# Patient Record
Sex: Male | Born: 1970 | Hispanic: Yes | Marital: Married | Smoking: Never smoker
Health system: Southern US, Community
[De-identification: ages and names within clinical notes are randomized; demographics above are authoritative.]

## PROBLEM LIST (undated history)

## (undated) DIAGNOSIS — K766 Portal hypertension: Secondary | ICD-10-CM

## (undated) DIAGNOSIS — I8510 Secondary esophageal varices without bleeding: Secondary | ICD-10-CM

## (undated) DIAGNOSIS — K7460 Unspecified cirrhosis of liver: Secondary | ICD-10-CM

## (undated) DIAGNOSIS — E119 Type 2 diabetes mellitus without complications: Secondary | ICD-10-CM

## (undated) DIAGNOSIS — I10 Essential (primary) hypertension: Secondary | ICD-10-CM

## (undated) DIAGNOSIS — F1010 Alcohol abuse, uncomplicated: Secondary | ICD-10-CM

## (undated) HISTORY — DX: Type 2 diabetes mellitus without complications: E11.9

---

## 2008-04-13 ENCOUNTER — Ambulatory Visit (HOSPITAL_COMMUNITY): Admission: RE | Admit: 2008-04-13 | Discharge: 2008-04-13 | Payer: Self-pay | Admitting: Emergency Medicine

## 2008-04-13 ENCOUNTER — Emergency Department (HOSPITAL_COMMUNITY): Admission: EM | Admit: 2008-04-13 | Discharge: 2008-04-13 | Payer: Self-pay | Admitting: Emergency Medicine

## 2016-04-22 ENCOUNTER — Inpatient Hospital Stay (HOSPITAL_COMMUNITY)
Admission: EM | Admit: 2016-04-22 | Discharge: 2016-04-26 | DRG: 683 | Disposition: A | Discharge status: Home / Self Care | Payer: Self-pay | Attending: Internal Medicine | Admitting: Internal Medicine

## 2016-04-22 ENCOUNTER — Encounter (HOSPITAL_COMMUNITY): Payer: Self-pay

## 2016-04-22 DIAGNOSIS — F10939 Alcohol use, unspecified with withdrawal, unspecified: Secondary | ICD-10-CM

## 2016-04-22 DIAGNOSIS — F1010 Alcohol abuse, uncomplicated: Secondary | ICD-10-CM | POA: Diagnosis present

## 2016-04-22 DIAGNOSIS — E878 Other disorders of electrolyte and fluid balance, not elsewhere classified: Secondary | ICD-10-CM

## 2016-04-22 DIAGNOSIS — E860 Dehydration: Secondary | ICD-10-CM | POA: Diagnosis present

## 2016-04-22 DIAGNOSIS — F10239 Alcohol dependence with withdrawal, unspecified: Secondary | ICD-10-CM | POA: Diagnosis present

## 2016-04-22 DIAGNOSIS — F10920 Alcohol use, unspecified with intoxication, uncomplicated: Secondary | ICD-10-CM

## 2016-04-22 DIAGNOSIS — I10 Essential (primary) hypertension: Secondary | ICD-10-CM | POA: Diagnosis present

## 2016-04-22 DIAGNOSIS — E871 Hypo-osmolality and hyponatremia: Secondary | ICD-10-CM | POA: Diagnosis present

## 2016-04-22 DIAGNOSIS — I951 Orthostatic hypotension: Secondary | ICD-10-CM

## 2016-04-22 DIAGNOSIS — E872 Acidosis, unspecified: Secondary | ICD-10-CM

## 2016-04-22 DIAGNOSIS — R55 Syncope and collapse: Secondary | ICD-10-CM | POA: Diagnosis present

## 2016-04-22 DIAGNOSIS — N179 Acute kidney failure, unspecified: Principal | ICD-10-CM | POA: Diagnosis present

## 2016-04-22 DIAGNOSIS — M6282 Rhabdomyolysis: Secondary | ICD-10-CM | POA: Diagnosis present

## 2016-04-22 HISTORY — DX: Essential (primary) hypertension: I10

## 2016-04-22 HISTORY — DX: Alcohol abuse, uncomplicated: F10.10

## 2016-04-22 LAB — CBC WITH DIFFERENTIAL/PLATELET
Basophils Absolute: 0.1 10*3/uL (ref 0.0–0.1)
Basophils Relative: 1 %
EOS PCT: 0 %
Eosinophils Absolute: 0 10*3/uL (ref 0.0–0.7)
HCT: 36 % — ABNORMAL LOW (ref 39.0–52.0)
HEMOGLOBIN: 12.3 g/dL — AB (ref 13.0–17.0)
LYMPHS ABS: 0.6 10*3/uL — AB (ref 0.7–4.0)
LYMPHS PCT: 11 %
MCH: 29.9 pg (ref 26.0–34.0)
MCHC: 34.2 g/dL (ref 30.0–36.0)
MCV: 87.4 fL (ref 78.0–100.0)
MONOS PCT: 10 %
Monocytes Absolute: 0.6 10*3/uL (ref 0.1–1.0)
NEUTROS PCT: 78 %
Neutro Abs: 4.3 10*3/uL (ref 1.7–7.7)
Platelets: 96 10*3/uL — ABNORMAL LOW (ref 150–400)
RBC: 4.12 MIL/uL — AB (ref 4.22–5.81)
RDW: 18.2 % — ABNORMAL HIGH (ref 11.5–15.5)
WBC: 5.6 10*3/uL (ref 4.0–10.5)

## 2016-04-22 MED ORDER — SODIUM CHLORIDE 0.9 % IV BOLUS (SEPSIS)
1000.0000 mL | Freq: Once | INTRAVENOUS | Status: AC
Start: 2016-04-22 — End: 2016-04-23
  Administered 2016-04-22: 1000 mL via INTRAVENOUS

## 2016-04-22 NOTE — ED Provider Notes (Signed)
[facility]-EMERGENCY DEPT Provider Note   CSN: [id number] Arrival date & time: 04/22/16  2254  By signing my name below, I, Phillis HaggisGabriella Gaje, attest that this documentation has been prepared under the direction and in the presence of Devoria AlbeIva Mansur Patti, MD. Electronically Signed: Phillis HaggisGabriella Gaje, ED Scribe. 04/22/16. 11:43 PM.  Time Seen 23:33 PM   History   Chief Complaint Chief Complaint  Patient presents with  . Weakness   The history is provided by the patient. No language interpreter was used.   HPI Comments: Wesley Williams is a 45 y.o. male with a hx of alcohol abuse brought in by EMS who presents to the Emergency Department complaining of generalized weakness onset 5 hours ago. 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 He states that it was very hot outside today and he was sweating a lot today, he experienced syncope and fell. He states that he did not drink a lot of water today because he was not thirsty. He was evaluated by EMS, told them he felt better, and stayed at work. He states that he went home and had cramps in his legs with diaphoresis and vomiting. The cramps have since resolved. Step-daughter called EMS at this time. He states that he had a normal BP while laying down in the ambulance, but experience orthostatic hypotension when he stood up, stating his BP dropped to 70. Pt has not been drinking alcohol tonight, but wife states that pt drinks a lot ~12 beers every day. He disputes that but admits to buying a 12 pack every day. Pt denies smoking or use of daily medications. He denies chest pain, abdominal pain, or diarrhea. He states he normally drinks water or Pepsi while working  PCP none  Past Medical History:  Diagnosis Date  . Alcohol abuse     Patient Active Problem List   Diagnosis Date Noted  . AKI (acute kidney injury) (HCC) 04/23/2016  . Dehydration 04/23/2016  . Rhabdomyolysis 04/23/2016  . Syncope and collapse 04/23/2016  . Alcohol abuse 04/23/2016    History  reviewed. No pertinent surgical history.    Home Medications    Prior to Admission medications   Not on File    Family History History reviewed. No pertinent family history.  Social History Social History  Substance Use Topics  . Smoking status: Never Smoker  . Smokeless tobacco: Never Used  . Alcohol use Yes  employed Lives with spouse   Allergies   Review of patient's allergies indicates no known allergies.   Review of Systems Review of Systems  Cardiovascular: Negative for chest pain.  Gastrointestinal: Positive for nausea and vomiting. Negative for abdominal pain and diarrhea.  Musculoskeletal: Positive for myalgias.  Neurological: Positive for syncope and weakness.  All other systems reviewed and are negative.  Physical Exam Updated Vital Signs BP 151/96 (BP Location: Left Arm)   Pulse 100   Temp 98 F (36.7 C) (Oral)   Resp 18   Ht 5\' 6"  (1.676 m)   Wt 176 lb (79.8 kg)   SpO2 100%   BMI 28.41 kg/m   Vital signs normal except borderline pulse   Physical Exam  Constitutional: He is oriented to person, place, and time. He appears well-developed and well-nourished.  Non-toxic appearance. He does not appear ill. No distress.  Appears intoxicated  HENT:  Head: Normocephalic and atraumatic.  Right Ear: External ear normal.  Left Ear: External ear normal.  Nose: Nose normal. No mucosal edema or rhinorrhea.  Mouth/Throat: Oropharynx  is clear and moist. Mucous membranes are dry. No dental abscesses or uvula swelling.  Tongue dry  Eyes: Conjunctivae and EOM are normal. Pupils are equal, round, and reactive to light.  Neck: Normal range of motion and full passive range of motion without pain. Neck supple.  Cardiovascular: Normal rate, regular rhythm and normal heart sounds.  Exam reveals no gallop and no friction rub.   No murmur heard. Pulmonary/Chest: Effort normal and breath sounds normal. No respiratory distress. He has no wheezes. He has no rhonchi. He  has no rales. He exhibits no tenderness and no crepitus.  Abdominal: Soft. Normal appearance and bowel sounds are normal. He exhibits no distension. There is no tenderness. There is no rebound and no guarding.  Musculoskeletal: Normal range of motion. He exhibits no edema or tenderness.  Moves all extremities well.   Neurological: He is alert and oriented to person, place, and time. He has normal strength. No cranial nerve deficit.  Skin: Skin is warm, dry and intact. No rash noted. No erythema. No pallor.  Psychiatric: His speech is slurred. He is slowed.  Nursing note and vitals reviewed.    ED Treatments / Results  DIAGNOSTIC STUDIES: Oxygen Saturation is  100% on RA, normal by my interpretation.    Labs (all labs ordered are listed, but only abnormal results are displayed) Results for orders placed or performed during the hospital encounter of 04/22/16  Comprehensive metabolic panel  Result Value Ref Range   Sodium 130 (L) 135 - 145 mmol/L   Potassium 3.5 3.5 - 5.1 mmol/L   Chloride 96 (L) 101 - 111 mmol/L   CO2 17 (L) 22 - 32 mmol/L   Glucose, Bld 146 (H) 65 - 99 mg/dL   BUN 28 (H) 6 - 20 mg/dL   Creatinine, Ser 9.812.49 (H) 0.61 - 1.24 mg/dL   Calcium 9.2 8.9 - 19.110.3 mg/dL   Total Protein 47.810.8 (H) 6.5 - 8.1 g/dL   Albumin 4.3 3.5 - 5.0 g/dL   AST 295137 (H) 15 - 41 U/L   ALT 39 17 - 63 U/L   Alkaline Phosphatase 112 38 - 126 U/L   Total Bilirubin 2.1 (H) 0.3 - 1.2 mg/dL   GFR calc non Af Amer 29 (L) >60 mL/min   GFR calc Af Amer 34 (L) >60 mL/min   Anion gap 17 (H) 5 - 15  Ethanol  Result Value Ref Range   Alcohol, Ethyl (B) 233 (H) <5 mg/dL  CBC with Differential  Result Value Ref Range   WBC 5.6 4.0 - 10.5 K/uL   RBC 4.12 (L) 4.22 - 5.81 MIL/uL   Hemoglobin 12.3 (L) 13.0 - 17.0 g/dL   HCT 62.136.0 (L) 30.839.0 - 65.752.0 %   MCV 87.4 78.0 - 100.0 fL   MCH 29.9 26.0 - 34.0 pg   MCHC 34.2 30.0 - 36.0 g/dL   RDW 84.618.2 (H) 96.211.5 - 95.215.5 %   Platelets 96 (L) 150 - 400 K/uL    Neutrophils Relative % 78 %   Neutro Abs 4.3 1.7 - 7.7 K/uL   Lymphocytes Relative 11 %   Lymphs Abs 0.6 (L) 0.7 - 4.0 K/uL   Monocytes Relative 10 %   Monocytes Absolute 0.6 0.1 - 1.0 K/uL   Eosinophils Relative 0 %   Eosinophils Absolute 0.0 0.0 - 0.7 K/uL   Basophils Relative 1 %   Basophils Absolute 0.1 0.0 - 0.1 K/uL  Magnesium  Result Value Ref Range   Magnesium 2.1 1.7 -  2.4 mg/dL  CK  Result Value Ref Range   Total CK 1,232 (H) 49 - 397 U/L   Laboratory interpretation all normal except Renal failure without old labs to compare to, metabolic acidosis, alcohol intoxication, mild anemia, hyponatremia, low chloride, CONSISTENT with dehydration    EKG  EKG Interpretation  Date/Time:  Thursday April 22 2016 23:27:21 EDT Ventricular Rate:  86 PR Interval:    QRS Duration: 99 QT Interval:  394 QTC Calculation: 472 R Axis:   58 Text Interpretation:  Sinus rhythm Non-specific ST-t changes Nonspecific ST abnormality No old tracing to compare Confirmed by Warda Mcqueary  MD-I, Aydon Swamy ([id number]) on 04/23/2016 2:37:13 AM      Radiology No results found.  Procedures Procedures (including critical care time)  Medications Ordered in ED Medications  LORazepam (ATIVAN) tablet 1 mg (not administered)    Or  LORazepam (ATIVAN) injection 1 mg (not administered)  thiamine (VITAMIN B-1) tablet 100 mg (not administered)    Or  thiamine (B-1) injection 100 mg (not administered)  folic acid (FOLVITE) tablet 1 mg (not administered)  multivitamin with minerals tablet 1 tablet (not administered)  metoprolol tartrate (LOPRESSOR) tablet 25 mg (25 mg Oral Given 04/23/16 0216)  sodium chloride 0.9 % bolus 1,000 mL (0 mLs Intravenous Stopped 04/23/16 0108)  sodium chloride 0.9 % bolus 1,000 mL (0 mLs Intravenous Stopped 04/23/16 0108)  sodium chloride 0.9 % bolus 1,000 mL (0 mLs Intravenous Stopped 04/23/16 0217)     Initial Impression / Assessment and Plan / ED Course     I have reviewed the triage  vital signs and the nursing notes.  Pertinent labs & imaging results that were available during my care of the patient were reviewed by me and considered in my medical decision making (see chart for details).  Clinical Course  11:42 PM-Discussed treatment plan which includes IV fluids with pt at bedside and pt agreed to plan.   PT was given IV fluids.   After reviewing his initial labs, CK was added to labs. Pt was given more IV fluids.  12:54 AM-Pt reports relief with IV fluids. Pt is updated on his blood work and what the values mean. He now admits that he was drinking alcohol, but stopped at noon. Discussed the need to stay in the hospital to improve kidney function and to be monitored overnight. He is agreeable  01:13 AM Dr Conley Rolls, hospitalist, will see patient for admission  Final Clinical Impressions(s) / ED Diagnoses   Final diagnoses:  Alcohol abuse  Alcohol intoxication, uncomplicated (HCC)  Acute renal injury (HCC)  Metabolic acidosis  Hyponatremia  Chloride, decreased level  Non-traumatic rhabdomyolysis  Syncope, unspecified syncope type  Orthostatic hypotension   Plan admission  Devoria Albe, MD, FACEP   I personally performed the services described in this documentation, which was scribed in my presence. The recorded information has been reviewed and considered.  Devoria Albe, MD, Concha Pyo, MD 04/23/16 [phone]

## 2016-04-22 NOTE — ED Triage Notes (Signed)
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 Patient states EMS came to house today because patient fell. Spouse states patient has been drinking a lot of beer, but has not drank beer today. Patient states EMS suggested he come here to get fluids. Patient denies pain.

## 2016-04-23 ENCOUNTER — Encounter (HOSPITAL_COMMUNITY): Payer: Self-pay | Admitting: Internal Medicine

## 2016-04-23 ENCOUNTER — Observation Stay (HOSPITAL_BASED_OUTPATIENT_CLINIC_OR_DEPARTMENT_OTHER): Payer: Self-pay

## 2016-04-23 DIAGNOSIS — M6282 Rhabdomyolysis: Secondary | ICD-10-CM

## 2016-04-23 DIAGNOSIS — N179 Acute kidney failure, unspecified: Secondary | ICD-10-CM | POA: Diagnosis present

## 2016-04-23 DIAGNOSIS — R55 Syncope and collapse: Secondary | ICD-10-CM

## 2016-04-23 DIAGNOSIS — E860 Dehydration: Secondary | ICD-10-CM | POA: Diagnosis present

## 2016-04-23 DIAGNOSIS — F1010 Alcohol abuse, uncomplicated: Secondary | ICD-10-CM | POA: Diagnosis present

## 2016-04-23 HISTORY — DX: Rhabdomyolysis: M62.82

## 2016-04-23 LAB — ECHOCARDIOGRAM COMPLETE
Height: 65 in
Weight: 2892.44 [oz_av]

## 2016-04-23 LAB — BASIC METABOLIC PANEL
ANION GAP: 11 (ref 5–15)
BUN: 23 mg/dL — ABNORMAL HIGH (ref 6–20)
CALCIUM: 8.1 mg/dL — AB (ref 8.9–10.3)
CHLORIDE: 106 mmol/L (ref 101–111)
CO2: 17 mmol/L — ABNORMAL LOW (ref 22–32)
CREATININE: 1.16 mg/dL (ref 0.61–1.24)
GFR calc non Af Amer: >60 mL/min (ref >60)
Glucose, Bld: 100 mg/dL — ABNORMAL HIGH (ref 65–99)
Potassium: 3.6 mmol/L (ref 3.5–5.1)
SODIUM: 134 mmol/L — AB (ref 135–145)

## 2016-04-23 LAB — COMPREHENSIVE METABOLIC PANEL
ALBUMIN: 4.3 g/dL (ref 3.5–5.0)
ALT: 39 U/L (ref 17–63)
AST: 137 U/L — AB (ref 15–41)
Alkaline Phosphatase: 112 U/L (ref 38–126)
Anion gap: 17 — ABNORMAL HIGH (ref 5–15)
BUN: 28 mg/dL — AB (ref 6–20)
CHLORIDE: 96 mmol/L — AB (ref 101–111)
CO2: 17 mmol/L — ABNORMAL LOW (ref 22–32)
CREATININE: 2.49 mg/dL — AB (ref 0.61–1.24)
Calcium: 9.2 mg/dL (ref 8.9–10.3)
GFR calc Af Amer: 34 mL/min — ABNORMAL LOW (ref >60)
GFR, EST NON AFRICAN AMERICAN: 29 mL/min — AB (ref >60)
GLUCOSE: 146 mg/dL — AB (ref 65–99)
Potassium: 3.5 mmol/L (ref 3.5–5.1)
Sodium: 130 mmol/L — ABNORMAL LOW (ref 135–145)
Total Bilirubin: 2.1 mg/dL — ABNORMAL HIGH (ref 0.3–1.2)
Total Protein: 10.8 g/dL — ABNORMAL HIGH (ref 6.5–8.1)

## 2016-04-23 LAB — CBC
HEMATOCRIT: 30.5 % — AB (ref 39.0–52.0)
HEMOGLOBIN: 10.4 g/dL — AB (ref 13.0–17.0)
MCH: 30 pg (ref 26.0–34.0)
MCHC: 34.1 g/dL (ref 30.0–36.0)
MCV: 87.9 fL (ref 78.0–100.0)
Platelets: 70 10*3/uL — ABNORMAL LOW (ref 150–400)
RBC: 3.47 MIL/uL — AB (ref 4.22–5.81)
RDW: 17.8 % — ABNORMAL HIGH (ref 11.5–15.5)
WBC: 4.4 10*3/uL (ref 4.0–10.5)

## 2016-04-23 LAB — TSH: TSH: 1.67 u[IU]/mL (ref 0.350–4.500)

## 2016-04-23 LAB — ETHANOL: ALCOHOL ETHYL (B): 233 mg/dL — AB (ref <5)

## 2016-04-23 LAB — CREATININE, SERUM
Creatinine, Ser: 1.23 mg/dL (ref 0.61–1.24)
GFR calc Af Amer: >60 mL/min
GFR calc non Af Amer: >60 mL/min

## 2016-04-23 LAB — CK
Total CK: 1119 U/L — ABNORMAL HIGH (ref 49–397)
Total CK: 1232 U/L — ABNORMAL HIGH (ref 49–397)

## 2016-04-23 LAB — MAGNESIUM: Magnesium: 2.1 mg/dL (ref 1.7–2.4)

## 2016-04-23 MED ORDER — ACETAMINOPHEN 325 MG PO TABS
650.0000 mg | ORAL_TABLET | Freq: Four times a day (QID) | ORAL | Status: DC | PRN
  Administered 2016-04-23: 650 mg via ORAL
  Filled 2016-04-23: qty 2

## 2016-04-23 MED ORDER — M.V.I. ADULT IV INJ
INJECTION | Freq: Once | INTRAVENOUS | Status: AC
  Administered 2016-04-23: 05:00:00 via INTRAVENOUS
  Filled 2016-04-23: qty 1000

## 2016-04-23 MED ORDER — SODIUM CHLORIDE 0.9 % IV SOLN
INTRAVENOUS | Status: DC
  Administered 2016-04-23 (×2): via INTRAVENOUS

## 2016-04-23 MED ORDER — THIAMINE HCL 100 MG/ML IJ SOLN
100.0000 mg | Freq: Every day | INTRAMUSCULAR | Status: DC
  Administered 2016-04-24: 100 mg via INTRAVENOUS
  Filled 2016-04-23 (×2): qty 2

## 2016-04-23 MED ORDER — HEPARIN SODIUM (PORCINE) 5000 UNIT/ML IJ SOLN
5000.0000 [IU] | Freq: Three times a day (TID) | INTRAMUSCULAR | Status: DC
  Administered 2016-04-23 – 2016-04-26 (×10): 5000 [IU] via SUBCUTANEOUS
  Filled 2016-04-23 (×10): qty 1

## 2016-04-23 MED ORDER — VITAMIN B-1 100 MG PO TABS
100.0000 mg | ORAL_TABLET | Freq: Every day | ORAL | Status: DC
Start: 2016-04-23 — End: 2016-04-26
  Administered 2016-04-23 – 2016-04-26 (×3): 100 mg via ORAL
  Filled 2016-04-23 (×3): qty 1

## 2016-04-23 MED ORDER — SODIUM CHLORIDE 0.9% FLUSH
3.0000 mL | Freq: Two times a day (BID) | INTRAVENOUS | Status: DC
  Administered 2016-04-24 – 2016-04-25 (×2): 3 mL via INTRAVENOUS

## 2016-04-23 MED ORDER — LORAZEPAM 1 MG PO TABS
1.0000 mg | ORAL_TABLET | Freq: Four times a day (QID) | ORAL | Status: DC | PRN
  Administered 2016-04-24: 1 mg via ORAL

## 2016-04-23 MED ORDER — M.V.I. ADULT IV INJ
INJECTION | INTRAVENOUS | Status: AC
  Filled 2016-04-23: qty 10

## 2016-04-23 MED ORDER — ADULT MULTIVITAMIN W/MINERALS CH
1.0000 | ORAL_TABLET | Freq: Every day | ORAL | Status: DC
  Administered 2016-04-23 – 2016-04-26 (×4): 1 via ORAL
  Filled 2016-04-23 (×4): qty 1

## 2016-04-23 MED ORDER — TAMSULOSIN HCL 0.4 MG PO CAPS
0.4000 mg | ORAL_CAPSULE | Freq: Every day | ORAL | Status: DC
  Administered 2016-04-23 – 2016-04-24 (×2): 0.4 mg via ORAL
  Filled 2016-04-23 (×2): qty 1

## 2016-04-23 MED ORDER — LORAZEPAM 2 MG/ML IJ SOLN
1.0000 mg | Freq: Four times a day (QID) | INTRAMUSCULAR | Status: DC | PRN
  Administered 2016-04-23 – 2016-04-24 (×3): 1 mg via INTRAVENOUS
  Filled 2016-04-23 (×4): qty 1

## 2016-04-23 MED ORDER — FOLIC ACID 5 MG/ML IJ SOLN
INTRAMUSCULAR | Status: AC
  Filled 2016-04-23: qty 0.2

## 2016-04-23 MED ORDER — THIAMINE HCL 100 MG/ML IJ SOLN
INTRAMUSCULAR | Status: AC
  Filled 2016-04-23: qty 2

## 2016-04-23 MED ORDER — SODIUM CHLORIDE 0.9 % IV BOLUS (SEPSIS)
1000.0000 mL | Freq: Once | INTRAVENOUS | Status: AC
  Administered 2016-04-23: 1000 mL via INTRAVENOUS

## 2016-04-23 MED ORDER — FOLIC ACID 1 MG PO TABS
1.0000 mg | ORAL_TABLET | Freq: Every day | ORAL | Status: DC
  Administered 2016-04-23 – 2016-04-26 (×4): 1 mg via ORAL
  Filled 2016-04-23 (×4): qty 1

## 2016-04-23 MED ORDER — METOPROLOL TARTRATE 25 MG PO TABS
25.0000 mg | ORAL_TABLET | Freq: Two times a day (BID) | ORAL | Status: DC
  Administered 2016-04-23 – 2016-04-24 (×4): 25 mg via ORAL
  Filled 2016-04-23 (×4): qty 1

## 2016-04-23 NOTE — Progress Notes (Signed)
*  PRELIMINARY RESULTS* Echocardiogram 2D Echocardiogram has been performed.  Stacey DrainWhite, Arine Foley J 04/23/2016, 11:18 AM

## 2016-04-23 NOTE — H&P (Signed)
History and Physical    Wesley ShyJose A Agosto ZOX:[id number] DOB: 01/23/1971 DOA: 04/22/2016  PCP: No PCP Per Patient   Patient coming from: Home   Chief Complaint: Leg cramps   HPI: Wesley Williams is a 45 y.o. male with benign past medical history, but perhaps by virtue of hardly ever saw any physician, hx of alcohol abuse presented with complaints of leg cramps. PT has alcohol in his blood and had a syncope episode earlier today. EMS came but he refused to come to the hospital at that time.  Pt went home and began to have cramps in his legs with associated diaphoresis and vomiting. Family brought him to the hospital. Pt states he drinks 5-6 beers a night. Pt denies chest pain, abdominal pain, or diarrhea. While in the ED he had an elevated creatinine of 2.49 with unclear baseline renal Fx,  and a CBK of 1200. He was placed on IVFs and referred for further evaluation of AKI.   ED Course: BUN 28. Cr 2.49. AST 137. Platelets 96. EKG showed NSR with no acute ST T changes.   Review of Systems: As per HPI otherwise 10 point review of systems negative.    Past Medical History:  Diagnosis Date  . Alcohol abuse     History reviewed. No pertinent surgical history.   reports that he has never smoked. He has never used smokeless tobacco. He reports that he drinks alcohol. He reports that he does not use drugs.  No Known Allergies  History reviewed. No pertinent family history.  Prior to Admission medications   Not on File    Physical Exam: Vitals:   04/22/16 2330 04/23/16 0000 04/23/16 0030 04/23/16 0100  BP: (!) 150/107 (!) 167/104 (!) 153/101 161/96  Pulse: 82 87 88 97  Resp: 20 22 22 20   Temp:      TempSrc:      SpO2: 98% 100% 95% 99%  Weight:      Height:          Constitutional: NAD, calm, comfortable Vitals:   04/22/16 2330 04/23/16 0000 04/23/16 0030 04/23/16 0100  BP: (!) 150/107 (!) 167/104 (!) 153/101 161/96  Pulse: 82 87 88 97  Resp: 20 22 22 20   Temp:      TempSrc:        SpO2: 98% 100% 95% 99%  Weight:      Height:       Eyes: PERRL, lids and conjunctivae normal ENMT: Mucous membranes are moist. Posterior pharynx clear of any exudate or lesions.Normal dentition.  Neck: normal, supple, no masses, no thyromegaly Respiratory: clear to auscultation bilaterally, no wheezing, no crackles. Normal respiratory effort. No accessory muscle use.  Cardiovascular: Regular rate and rhythm, no murmurs / rubs / gallops. No extremity edema. 2+ pedal pulses. No carotid bruits.  Abdomen: no tenderness, no masses palpated. No hepatosplenomegaly. Bowel sounds positive.  Musculoskeletal: no clubbing / cyanosis. No joint deformity upper and lower extremities. Good ROM, no contractures. Normal muscle tone.  Skin: no rashes, lesions, ulcers. No induration Neurologic: CN 2-12 grossly intact. Sensation intact, DTR normal. Strength 5/5 in all 4.  Psychiatric: Normal judgment and insight. Alert and oriented x 3. Normal mood.    Labs on Admission: I have personally reviewed following labs and imaging studies  CBC:  Recent Labs Lab 04/22/16 2330  WBC 5.6  NEUTROABS 4.3  HGB 12.3*  HCT 36.0*  MCV 87.4  PLT 96*   Basic Metabolic Panel:  Recent Labs Lab 04/22/16  2330  NA 130*  K 3.5  CL 96*  CO2 17*  GLUCOSE 146*  BUN 28*  CREATININE 2.49*  CALCIUM 9.2  MG 2.1   Liver Function Tests:  Recent Labs Lab 04/22/16 2330  AST 137*  ALT 39  ALKPHOS 112  BILITOT 2.1*  PROT 10.8*  ALBUMIN 4.3   Cardiac Enzymes:  Recent Labs Lab 04/23/16 0031  CKTOTAL 1,232*   EKG: Independently reviewed.   Assessment/Plan Active Problems:   * No active hospital problems. *   1. AKI. Cr 2.49 . Unsure of baseline creatine. Will give IV fluids.   I suspect he has baseline CKD, likely from chronic untreated HTN.  Will follow Cr.   2. Rhabdomyolysis. Will give IVFs. Follow CPK.  3. Alcohol abuse. Will counsel on the importance of cessation.  Will place on WICA with PRN  ativan and banana bag. 4. HTN:  Will start him on Lopressor BID.    DVT prophylaxis: SCDs  Code Status: Full Family Communication: Son and niece bedside.  Disposition Plan: Discharge home once improved  Consults called: None  Admission status: Observation    Houston SirenPeter Jasper Hanf, MD FACP [facility] If 7PM-7AM, please contact night-coverage [email or web address] Password [email or web address]  04/23/2016, 1:12 AM   By signing my name below, I, Cynda AcresHailei Fulton, attest that this documentation has been prepared under the direction and in the presence of Houston SirenPeter Shirly Bartosiewicz, MD. Electronically signed: Cynda AcresHailei Fulton, Scribe. 04/23/16 1:15AM

## 2016-04-23 NOTE — Progress Notes (Signed)
Patient with orders to be transferred to Unit 300. Report called to Rolm BookbinderLisa B, RN. Patient transferred to 311. Patient stable at time of transfer.

## 2016-04-23 NOTE — Progress Notes (Addendum)
PROGRESS NOTE    Wesley Williams  ZOX:[id number] DOB: 03-10-1971 DOA: 04/22/2016 PCP: No PCP Per Patient    Brief Narrative:  45 y.o. male with benign past medical history, but perhaps by virtue of hardly ever saw any physician, hx of alcohol abuse presented with complaints of leg cramps. PT has alcohol in his blood and had a syncope episode earlier today. EMS came but he refused to come to the hospital at that time.  Pt went home and began to have cramps in his legs with associated diaphoresis and vomiting. Family brought him to the hospital. Pt states he drinks 5-6 beers a night. Pt denies chest pain, abdominal pain, or diarrhea. While in the ED he had an elevated creatinine of 2.49 with unclear baseline renal Fx,  and a CBK of 1200. He was placed on IVFs and referred for further evaluation of AKI  Assessment & Plan:   Principal Problem:   AKI (acute kidney injury) (HCC) Active Problems:   Dehydration   Rhabdomyolysis   Syncope and collapse   Alcohol abuse  1. Acute renal failure 1. Suspect secondary to markedly dehydration, prerenal renal failure 2. Continue IV fluids as tolerated 3. Continue monitor renal function. 4. Improving creatinine 5. Little urine output noted today. Bladder scan obtained with around 400 mL of urine post residual. We'll do in and out cath. 6. Will start Flomax empirically 2. Dehydration 1. Patient continued on IV fluids 2. Clinically seems improved 3. Rhabdomyolysis 1. Peak CK of over 1200 2. Repeat CK 12 hours later down to around 1100 4. Syncope and collapse 1. Suspect related to ETOH intoxication given elevated ETOH level on admit 5. Alcohol abuse with ETOH withdrawals 1. Presenting ETOH level of 233 2. Pt reports last ETOH intake was day prior to admission 3. Patient is continued on CIWA 4. Some tremors noted on exam this AM . By afternoon, tremors have worsened to where pt has difficulty operating cell phone. Also appears somewhat  diaphoretic. 5. Called to update patient's wife. Although pt is eager to go home, both pt and his wife agree to stay to tx ETOH withdrawal  DVT prophylaxis: heparin subq Code Status: Full Family Communication: Pt in room Disposition Plan: Uncertain at this time  Consultants:    Procedures:   2d echo  Antimicrobials: Anti-infectives    None       Subjective: Claims to be feeling better. Questioning about going home  Objective: Vitals:   04/23/16 0600 04/23/16 0745 04/23/16 0800 04/23/16 1133  BP: (!) 141/86  (!) 164/106   Pulse:      Resp: 20  (!) 26   Temp:  97.9 F (36.6 C)  98.3 F (36.8 C)  TempSrc:  Oral  Oral  SpO2: 97%     Weight:      Height:        Intake/Output Summary (Last 24 hours) at 04/23/16 1529 Last data filed at 04/23/16 1400  Gross per 24 hour  Intake              240 ml  Output              600 ml  Net             -360 ml   Filed Weights   04/22/16 2304 04/23/16 0411  Weight: 79.8 kg (176 lb) 82 kg (180 lb 12.4 oz)    Examination: General exam: Appears calm and comfortable, laying in bed Respiratory system: Clear to  auscultation. Respiratory effort normal. Cardiovascular system: S1 & S2 heard, RRR Gastrointestinal system: Abdomen is nondistended, soft and nontender. No organomegaly or masses felt. Normal bowel sounds heard. Central nervous system: Alert and oriented. Generalized shaking/tremors. Extremities: Symmetric 5 x 5 power. Skin: No rashes, lesions Psychiatry: Judgement and insight appear normal. Mood & affect appropriate.   Data Reviewed: I have personally reviewed following labs and imaging studies  CBC:  Recent Labs Lab 04/22/16 2330 04/23/16 0454  WBC 5.6 4.4  NEUTROABS 4.3  --   HGB 12.3* 10.4*  HCT 36.0* 30.5*  MCV 87.4 87.9  PLT 96* 70*   Basic Metabolic Panel:  Recent Labs Lab 04/22/16 2330 04/23/16 0454  NA 130* 134*  K 3.5 3.6  CL 96* 106  CO2 17* 17*  GLUCOSE 146* 100*  BUN 28* 23*   CREATININE 2.49* 1.16  1.23  CALCIUM 9.2 8.1*  MG 2.1  --    GFR: Estimated Creatinine Clearance: 74 mL/min (by C-G formula based on SCr of 1.23 mg/dL). Liver Function Tests:  Recent Labs Lab 04/22/16 2330  AST 137*  ALT 39  ALKPHOS 112  BILITOT 2.1*  PROT 10.8*  ALBUMIN 4.3   No results for input(s): LIPASE, AMYLASE in the last 168 hours. No results for input(s): AMMONIA in the last 168 hours. Coagulation Profile: No results for input(s): INR, PROTIME in the last 168 hours. Cardiac Enzymes:  Recent Labs Lab 04/23/16 0031 04/23/16 1427  CKTOTAL 1,232* 1,119*   BNP (last 3 results) No results for input(s): PROBNP in the last 8760 hours. HbA1C: No results for input(s): HGBA1C in the last 72 hours. CBG: No results for input(s): GLUCAP in the last 168 hours. Lipid Profile: No results for input(s): CHOL, HDL, LDLCALC, TRIG, CHOLHDL, LDLDIRECT in the last 72 hours. Thyroid Function Tests:  Recent Labs  04/23/16 0454  TSH 1.670   Anemia Panel: No results for input(s): VITAMINB12, FOLATE, FERRITIN, TIBC, IRON, RETICCTPCT in the last 72 hours. Sepsis Labs: No results for input(s): PROCALCITON, LATICACIDVEN in the last 168 hours.  No results found for this or any previous visit (from the past 240 hour(s)).   Radiology Studies: No results found.  Scheduled Meds: . folic acid  1 mg Oral Daily  . heparin  5,000 Units Subcutaneous Q8H  . metoprolol tartrate  25 mg Oral BID  . multivitamin with minerals  1 tablet Oral Daily  . sodium chloride flush  3 mL Intravenous Q12H  . thiamine  100 mg Oral Daily   Or  . thiamine  100 mg Intravenous Daily   Continuous Infusions: . sodium chloride 150 mL/hr at 04/23/16 1231     LOS: 0 days   Jackilyn Umphlett, Scheryl MartenSTEPHEN K, MD [facility] Pager [phone]  If 7PM-7AM, please contact night-coverage [email or web address] Password [email or web address] 04/23/2016, 3:29 PM

## 2016-04-24 DIAGNOSIS — R55 Syncope and collapse: Secondary | ICD-10-CM

## 2016-04-24 DIAGNOSIS — F1010 Alcohol abuse, uncomplicated: Secondary | ICD-10-CM

## 2016-04-24 DIAGNOSIS — N179 Acute kidney failure, unspecified: Principal | ICD-10-CM

## 2016-04-24 DIAGNOSIS — M6282 Rhabdomyolysis: Secondary | ICD-10-CM

## 2016-04-24 DIAGNOSIS — E860 Dehydration: Secondary | ICD-10-CM

## 2016-04-24 LAB — CBC
HCT: 30.6 % — ABNORMAL LOW (ref 39.0–52.0)
Hemoglobin: 10.2 g/dL — ABNORMAL LOW (ref 13.0–17.0)
MCH: 29.7 pg (ref 26.0–34.0)
MCHC: 33.3 g/dL (ref 30.0–36.0)
MCV: 89.2 fL (ref 78.0–100.0)
PLATELETS: 67 10*3/uL — AB (ref 150–400)
RBC: 3.43 MIL/uL — ABNORMAL LOW (ref 4.22–5.81)
RDW: 17.8 % — AB (ref 11.5–15.5)
WBC: 3.4 10*3/uL — AB (ref 4.0–10.5)

## 2016-04-24 LAB — BASIC METABOLIC PANEL
Anion gap: 8 (ref 5–15)
BUN: 10 mg/dL (ref 6–20)
CALCIUM: 7.8 mg/dL — AB (ref 8.9–10.3)
CO2: 23 mmol/L (ref 22–32)
CREATININE: 0.58 mg/dL — AB (ref 0.61–1.24)
Chloride: 104 mmol/L (ref 101–111)
GFR calc non Af Amer: >60 mL/min (ref >60)
Glucose, Bld: 117 mg/dL — ABNORMAL HIGH (ref 65–99)
Potassium: 3.4 mmol/L — ABNORMAL LOW (ref 3.5–5.1)
Sodium: 135 mmol/L (ref 135–145)

## 2016-04-24 LAB — MRSA PCR SCREENING: MRSA BY PCR: NEGATIVE

## 2016-04-24 LAB — CK: CK TOTAL: 800 U/L — AB (ref 49–397)

## 2016-04-24 MED ORDER — SODIUM CHLORIDE 0.9 % IV SOLN
INTRAVENOUS | Status: DC
  Administered 2016-04-24 – 2016-04-26 (×3): via INTRAVENOUS

## 2016-04-24 MED ORDER — ZIPRASIDONE MESYLATE 20 MG IM SOLR: 20.0000 mg | Freq: Once | INTRAMUSCULAR | Status: DC

## 2016-04-24 MED ORDER — POTASSIUM CHLORIDE 10 MEQ/100ML IV SOLN
10.0000 meq | INTRAVENOUS | Status: AC
  Administered 2016-04-24 (×4): 10 meq via INTRAVENOUS
  Filled 2016-04-24: qty 100

## 2016-04-24 MED ORDER — METOPROLOL TARTRATE 5 MG/5ML IV SOLN
5.0000 mg | Freq: Four times a day (QID) | INTRAVENOUS | Status: DC
  Administered 2016-04-24 – 2016-04-26 (×9): 5 mg via INTRAVENOUS
  Filled 2016-04-24 (×9): qty 5

## 2016-04-24 MED ORDER — HALOPERIDOL LACTATE 5 MG/ML IJ SOLN
5.0000 mg | Freq: Once | INTRAMUSCULAR | Status: AC
  Administered 2016-04-24: 5 mg via INTRAMUSCULAR
  Filled 2016-04-24: qty 1

## 2016-04-24 MED ORDER — DEXMEDETOMIDINE HCL IN NACL 200 MCG/50ML IV SOLN
INTRAVENOUS | Status: AC
  Administered 2016-04-24: 0.2 ug/kg/h
  Filled 2016-04-24: qty 50

## 2016-04-24 MED ORDER — ZIPRASIDONE MESYLATE 20 MG IM SOLR
20.0000 mg | Freq: Once | INTRAMUSCULAR | Status: AC
  Administered 2016-04-24: 20 mg via INTRAMUSCULAR
  Filled 2016-04-24: qty 20

## 2016-04-24 MED ORDER — DEXMEDETOMIDINE HCL IN NACL 200 MCG/50ML IV SOLN
0.4000 ug/kg/h | INTRAVENOUS | Status: DC
Start: 2016-04-24 — End: 2016-04-25
  Administered 2016-04-24: 0.7 ug/kg/h via INTRAVENOUS
  Administered 2016-04-24: 1.2 ug/kg/h via INTRAVENOUS
  Administered 2016-04-24: 0.7 ug/kg/h via INTRAVENOUS
  Administered 2016-04-25 (×7): 1.2 ug/kg/h via INTRAVENOUS
  Filled 2016-04-24 (×4): qty 50
  Filled 2016-04-24: qty 100
  Filled 2016-04-24 (×5): qty 50

## 2016-04-24 MED ORDER — HYDRALAZINE HCL 20 MG/ML IJ SOLN
10.0000 mg | INTRAMUSCULAR | Status: DC | PRN
  Administered 2016-04-24 – 2016-04-26 (×2): 10 mg via INTRAVENOUS
  Filled 2016-04-24 (×2): qty 1

## 2016-04-24 NOTE — Progress Notes (Signed)
Haloperidol 5 mg IM ordered and given to patient

## 2016-04-24 NOTE — Progress Notes (Signed)
Pt very agitated. MD notified and Ativan 1 mg administered to patient.

## 2016-04-24 NOTE — Treatment Plan (Signed)
Patient seen. Full note to follow. Patient increasingly agitated, shaking, wanting to walk out of hospital. Given total of 3mg  of ativan this AM per CIWA and 5mg  haldol without effect. Patient still agitated and aggressive towards staff. Becoming difficult to re-direct. Now having visual hallucinations (sees "friend" in next patient room). Discussed with Dr. Elsie SaasJonnalagadda who recommends 20mg  Geodon IM now and when more stable, initiate precedex. Geodon ordered.

## 2016-04-24 NOTE — Progress Notes (Signed)
PROGRESS NOTE    Wesley Williams  ZOX:[id number] DOB: 01-22-1971 DOA: 04/22/2016 PCP: No PCP Per Patient    Brief Narrative:  45 y.o. male with benign past medical history, but perhaps by virtue of hardly ever saw any physician, hx of alcohol abuse presented with complaints of leg cramps. PT has alcohol in his blood and had a syncope episode earlier today. EMS came but he refused to come to the hospital at that time.  Pt went home and began to have cramps in his legs with associated diaphoresis and vomiting. Family brought him to the hospital. Pt states he drinks 5-6 beers a night. Pt denies chest pain, abdominal pain, or diarrhea. While in the ED he had an elevated creatinine of 2.49 with unclear baseline renal Fx,  and a CBK of 1200. He was placed on IVFs and referred for further evaluation of AKI  Assessment & Plan:   Principal Problem:   AKI (acute kidney injury) (HCC) Active Problems:   Dehydration   Rhabdomyolysis   Syncope and collapse   Alcohol abuse  1. Acute renal failure 1. Suspect secondary to markedly dehydration, prerenal renal failure 2. Renal function has normalized 3. Reduce IVF to 75cc/hr 4. Continue monitor renal function. 5. On 8/18, over 400cc on bladder scan. Started Flomax. Cont foley cath for now 2. Dehydration 1. Improved 3. Rhabdomyolysis 1. Peak CK of over 1200 2. Resolved 4. Syncope and collapse 1. Suspect related to ETOH intoxication given elevated ETOH level on admit 5. Alcohol abuse with ETOH withdrawals 1. Presenting ETOH level of 233 2. Pt reports last ETOH intake was day prior to admission 3. Per family, patient has MARKED daily daily ETOH abuse. Family is aware of patient's ETOH abuse and has tried to convince him to cut back in the past. 4. See treatment plan note. Patient has failed CIWA protocol while on floor 5. Pt now on Precedex. Pt is currently arousable and maintaining airway. Following closely. Low threshold for intubation. 6. Patient  transferred to ICU for closer monitoring  DVT prophylaxis: heparin subq Code Status: Full Family Communication: Pt in room, multiple family at bedside Disposition Plan: Uncertain at this time  Consultants:  Discussed case with Dr. Elsie Williams  Procedures:   2d echo  Antimicrobials: Anti-infectives    None      Subjective: More confused, agitated, hallucinating. Wants to go home  Objective: Vitals:   04/23/16 1500 04/23/16 2052 04/24/16 0628 04/24/16 0950  BP: (!) 148/85 (!) 158/87 (!) 153/81 (!) 147/98  Pulse:  82 76 73  Resp: (!) 21 20 20    Temp:  98.5 F (36.9 C) 98.2 F (36.8 C)   TempSrc:  Oral Oral   SpO2:  100% 99%   Weight:      Height:        Intake/Output Summary (Last 24 hours) at 04/24/16 1500 Last data filed at 04/24/16 0300  Gross per 24 hour  Intake           3132.5 ml  Output             1200 ml  Net           1932.5 ml   Filed Weights   04/22/16 2304 04/23/16 0411  Weight: 79.8 kg (176 lb) 82 kg (180 lb 12.4 oz)    Examination: General exam: Confused, agitated, ambulating hallway this AM, appears diaphoretic Respiratory system: Clear to auscultation. Respiratory effort normal. Cardiovascular system: S1 & S2 heard, RRR Gastrointestinal system: Abdomen  is nondistended, soft and nontender. No organomegaly or masses felt. Normal bowel sounds heard. Central nervous system: Alert and oriented. Continued shaking/tremors. Extremities: Symmetric 5 x 5 power. Skin: No rashes, lesions Psychiatry: Poor judgement, poor insight, confused, visual hallucinations, anxious, agitated  Data Reviewed: I have personally reviewed following labs and imaging studies  CBC:  Recent Labs Lab 04/22/16 2330 04/23/16 0454 04/24/16 0553  WBC 5.6 4.4 3.4*  NEUTROABS 4.3  --   --   HGB 12.3* 10.4* 10.2*  HCT 36.0* 30.5* 30.6*  MCV 87.4 87.9 89.2  PLT 96* 70* 67*   Basic Metabolic Panel:  Recent Labs Lab 04/22/16 2330 04/23/16 0454 04/24/16 0553  NA  130* 134* 135  K 3.5 3.6 3.4*  CL 96* 106 104  CO2 17* 17* 23  GLUCOSE 146* 100* 117*  BUN 28* 23* 10  CREATININE 2.49* 1.16  1.23 0.58*  CALCIUM 9.2 8.1* 7.8*  MG 2.1  --   --    GFR: Estimated Creatinine Clearance: 113.7 mL/min (by C-G formula based on SCr of 0.8 mg/dL). Liver Function Tests:  Recent Labs Lab 04/22/16 2330  AST 137*  ALT 39  ALKPHOS 112  BILITOT 2.1*  PROT 10.8*  ALBUMIN 4.3   No results for input(s): LIPASE, AMYLASE in the last 168 hours. No results for input(s): AMMONIA in the last 168 hours. Coagulation Profile: No results for input(s): INR, PROTIME in the last 168 hours. Cardiac Enzymes:  Recent Labs Lab 04/23/16 0031 04/23/16 1427 04/24/16 0553  CKTOTAL 1,232* 1,119* 800*   BNP (last 3 results) No results for input(s): PROBNP in the last 8760 hours. HbA1C: No results for input(s): HGBA1C in the last 72 hours. CBG: No results for input(s): GLUCAP in the last 168 hours. Lipid Profile: No results for input(s): CHOL, HDL, LDLCALC, TRIG, CHOLHDL, LDLDIRECT in the last 72 hours. Thyroid Function Tests:  Recent Labs  04/23/16 0454  TSH 1.670   Anemia Panel: No results for input(s): VITAMINB12, FOLATE, FERRITIN, TIBC, IRON, RETICCTPCT in the last 72 hours. Sepsis Labs: No results for input(s): PROCALCITON, LATICACIDVEN in the last 168 hours.  Recent Results (from the past 240 hour(s))  MRSA PCR Screening     Status: None   Collection Time: 04/24/16 12:30 AM  Result Value Ref Range Status   MRSA by PCR NEGATIVE NEGATIVE Final    Comment:        The GeneXpert MRSA Assay (FDA approved for NASAL specimens only), is one component of a comprehensive MRSA colonization surveillance program. It is not intended to diagnose MRSA infection nor to guide or monitor treatment for MRSA infections.      Radiology Studies: No results found.  Scheduled Meds: . folic acid  1 mg Oral Daily  . heparin  5,000 Units Subcutaneous Q8H  .  metoprolol  5 mg Intravenous Q6H  . multivitamin with minerals  1 tablet Oral Daily  . sodium chloride flush  3 mL Intravenous Q12H  . thiamine  100 mg Oral Daily   Or  . thiamine  100 mg Intravenous Daily   Continuous Infusions: . sodium chloride 75 mL/hr at 04/24/16 1354  . dexmedetomidine 0.7 mcg/kg/hr (04/24/16 1355)     LOS: 0 days   Moni Rothrock, Scheryl MartenSTEPHEN K, MD [facility] Pager [phone]  If 7PM-7AM, please contact night-coverage [email or web address] Password [email or web address] 04/24/2016, 3:00 PM

## 2016-04-24 NOTE — Progress Notes (Signed)
Pt transferred to ICU room 3. Report given to AutolivBob RN. All questions were answered and no further questions at this time.  Pt in no acute distress and resting in bed at this time.

## 2016-04-24 NOTE — Progress Notes (Signed)
Pt becoming extremely agitated and aggressive. MD notified and made aware. Received order to transfer patient to ICU room 3.

## 2016-04-24 NOTE — Progress Notes (Signed)
Geodon 20 mg IM ordered and administered.

## 2016-04-25 DIAGNOSIS — F10239 Alcohol dependence with withdrawal, unspecified: Secondary | ICD-10-CM

## 2016-04-25 DIAGNOSIS — F10939 Alcohol use, unspecified with withdrawal, unspecified: Secondary | ICD-10-CM

## 2016-04-25 LAB — COMPREHENSIVE METABOLIC PANEL
ALT: 41 U/L (ref 17–63)
AST: 110 U/L — AB (ref 15–41)
Albumin: 2.9 g/dL — ABNORMAL LOW (ref 3.5–5.0)
Alkaline Phosphatase: 97 U/L (ref 38–126)
Anion gap: 6 (ref 5–15)
BILIRUBIN TOTAL: 1.5 mg/dL — AB (ref 0.3–1.2)
BUN: 8 mg/dL (ref 6–20)
CHLORIDE: 105 mmol/L (ref 101–111)
CO2: 23 mmol/L (ref 22–32)
CREATININE: 0.48 mg/dL — AB (ref 0.61–1.24)
Calcium: 8.3 mg/dL — ABNORMAL LOW (ref 8.9–10.3)
Glucose, Bld: 161 mg/dL — ABNORMAL HIGH (ref 65–99)
Potassium: 3 mmol/L — ABNORMAL LOW (ref 3.5–5.1)
Sodium: 134 mmol/L — ABNORMAL LOW (ref 135–145)
TOTAL PROTEIN: 7.7 g/dL (ref 6.5–8.1)

## 2016-04-25 LAB — CK: CK TOTAL: 429 U/L — AB (ref 49–397)

## 2016-04-25 LAB — CBC
HCT: 32.5 % — ABNORMAL LOW (ref 39.0–52.0)
Hemoglobin: 10.9 g/dL — ABNORMAL LOW (ref 13.0–17.0)
MCH: 30 pg (ref 26.0–34.0)
MCHC: 33.5 g/dL (ref 30.0–36.0)
MCV: 89.5 fL (ref 78.0–100.0)
PLATELETS: 73 10*3/uL — AB (ref 150–400)
RBC: 3.63 MIL/uL — AB (ref 4.22–5.81)
RDW: 17.6 % — AB (ref 11.5–15.5)
WBC: 2.9 10*3/uL — AB (ref 4.0–10.5)

## 2016-04-25 LAB — MAGNESIUM: MAGNESIUM: 1.4 mg/dL — AB (ref 1.7–2.4)

## 2016-04-25 MED ORDER — POTASSIUM CHLORIDE 10 MEQ/100ML IV SOLN
10.0000 meq | INTRAVENOUS | Status: AC
  Administered 2016-04-25 (×6): 10 meq via INTRAVENOUS

## 2016-04-25 NOTE — Progress Notes (Signed)
PROGRESS NOTE    Wesley Williams  ZOX:[id number] DOB: 12-21-1970 DOA: 04/22/2016 PCP: No PCP Per Patient    Brief Narrative:  45 y.o. male with benign past medical history, but perhaps by virtue of hardly ever saw any physician, hx of alcohol abuse presented with complaints of leg cramps. PT has alcohol in his blood and had a syncope episode earlier today. EMS came but he refused to come to the hospital at that time.  Pt went home and began to have cramps in his legs with associated diaphoresis and vomiting. Family brought him to the hospital. Pt states he drinks 5-6 beers a night. Pt denies chest pain, abdominal pain, or diarrhea. While in the ED he had an elevated creatinine of 2.49 with unclear baseline renal Fx,  and a CBK of 1200. He was placed on IVFs and referred for further evaluation of AKI  Assessment & Plan:   Principal Problem:   AKI (acute kidney injury) (HCC) Active Problems:   Dehydration   Rhabdomyolysis   Syncope and collapse   Alcohol abuse  1. Acute renal failure 1. Suspect secondary to markedly dehydration, prerenal renal failure 2. Renal function has normalized 3. Continue monitor renal function. 4. On 8/18, over 400cc on bladder scan. Started Flomax 5. Consider voiding trial soon 2. Dehydration 1. Improved 3. Rhabdomyolysis 1. Peak CK of over 1200 2. Much improved 4. Syncope and collapse 1. Suspect related to ETOH intoxication given elevated ETOH level on admit. See below 5. Alcohol abuse with ETOH withdrawals 1. Presenting ETOH level of 233 2. Pt reports last ETOH intake was day prior to admission 3. Per family, patient has MARKED daily daily ETOH abuse. Family is aware of patient's ETOH abuse and has tried to convince him to cut back in the past. 4. Patient recently failed CIWA protocol while on floor and required transfer to ICU with Precedex. 5. Patient has maintained airway while on Precedex, now weaned to off 6. Stable at present  DVT prophylaxis:  heparin subq Code Status: Full Family Communication: Pt in room Disposition Plan: Possible d/c home in 24-48hrs  Consultants:  Discussed case with Dr. Elsie SaasJonnalagadda  Procedures:   2d echo  Antimicrobials: Anti-infectives    None      Subjective: On sedation this AM, difficult to assess  Objective: Vitals:   04/25/16 1200 04/25/16 1300 04/25/16 1400 04/25/16 1500  BP: (!) 143/93 (!) 139/112 (!) 147/95 109/68  Pulse: 60 (!) 53  (!) 58  Resp: 16  16 16   Temp:      TempSrc:      SpO2: 97% 96%  99%  Weight:      Height:        Intake/Output Summary (Last 24 hours) at 04/25/16 1609 Last data filed at 04/25/16 1454  Gross per 24 hour  Intake          3302.52 ml  Output             4850 ml  Net         -1547.48 ml   Filed Weights   04/22/16 2304 04/23/16 0411  Weight: 79.8 kg (176 lb) 82 kg (180 lb 12.4 oz)    Examination: General exam: Laying in bed, sedated, in nad Respiratory system: Clear to auscultation. Respiratory effort normal. Cardiovascular system: S1 & S2 heard, RRR Gastrointestinal system: Abdomen is nondistended, soft and nontender. No organomegaly or masses felt. Normal bowel sounds heard. Central nervous system: Sedated, difficult to assess Extremities: Symmetric 5  x 5 power. Skin: No rashes, lesions Psychiatry: currently sedated, difficult to determine  Data Reviewed: I have personally reviewed following labs and imaging studies  CBC:  Recent Labs Lab 04/22/16 2330 04/23/16 0454 04/24/16 0553 04/25/16 0441  WBC 5.6 4.4 3.4* 2.9*  NEUTROABS 4.3  --   --   --   HGB 12.3* 10.4* 10.2* 10.9*  HCT 36.0* 30.5* 30.6* 32.5*  MCV 87.4 87.9 89.2 89.5  PLT 96* 70* 67* 73*   Basic Metabolic Panel:  Recent Labs Lab 04/22/16 2330 04/23/16 0454 04/24/16 0553 04/25/16 0441  NA 130* 134* 135 134*  K 3.5 3.6 3.4* 3.0*  CL 96* 106 104 105  CO2 17* 17* 23 23  GLUCOSE 146* 100* 117* 161*  BUN 28* 23* 10 8  CREATININE 2.49* 1.16  1.23 0.58*  0.48*  CALCIUM 9.2 8.1* 7.8* 8.3*  MG 2.1  --   --  1.4*   GFR: Estimated Creatinine Clearance: 113.7 mL/min (by C-G formula based on SCr of 0.8 mg/dL). Liver Function Tests:  Recent Labs Lab 04/22/16 2330 04/25/16 0441  AST 137* 110*  ALT 39 41  ALKPHOS 112 97  BILITOT 2.1* 1.5*  PROT 10.8* 7.7  ALBUMIN 4.3 2.9*   No results for input(s): LIPASE, AMYLASE in the last 168 hours. No results for input(s): AMMONIA in the last 168 hours. Coagulation Profile: No results for input(s): INR, PROTIME in the last 168 hours. Cardiac Enzymes:  Recent Labs Lab 04/23/16 0031 04/23/16 1427 04/24/16 0553 04/25/16 0441  CKTOTAL 1,232* 1,119* 800* 429*   BNP (last 3 results) No results for input(s): PROBNP in the last 8760 hours. HbA1C: No results for input(s): HGBA1C in the last 72 hours. CBG: No results for input(s): GLUCAP in the last 168 hours. Lipid Profile: No results for input(s): CHOL, HDL, LDLCALC, TRIG, CHOLHDL, LDLDIRECT in the last 72 hours. Thyroid Function Tests:  Recent Labs  04/23/16 0454  TSH 1.670   Anemia Panel: No results for input(s): VITAMINB12, FOLATE, FERRITIN, TIBC, IRON, RETICCTPCT in the last 72 hours. Sepsis Labs: No results for input(s): PROCALCITON, LATICACIDVEN in the last 168 hours.  Recent Results (from the past 240 hour(s))  MRSA PCR Screening     Status: None   Collection Time: 04/24/16 12:30 AM  Result Value Ref Range Status   MRSA by PCR NEGATIVE NEGATIVE Final    Comment:        The GeneXpert MRSA Assay (FDA approved for NASAL specimens only), is one component of a comprehensive MRSA colonization surveillance program. It is not intended to diagnose MRSA infection nor to guide or monitor treatment for MRSA infections.      Radiology Studies: No results found.  Scheduled Meds: . folic acid  1 mg Oral Daily  . heparin  5,000 Units Subcutaneous Q8H  . metoprolol  5 mg Intravenous Q6H  . multivitamin with minerals  1 tablet  Oral Daily  . sodium chloride flush  3 mL Intravenous Q12H  . thiamine  100 mg Oral Daily   Or  . thiamine  100 mg Intravenous Daily   Continuous Infusions: . sodium chloride 75 mL/hr at 04/25/16 0829     LOS: 1 day   Tamel Abel, Scheryl MartenSTEPHEN K, MD [facility] Pager [phone]  If 7PM-7AM, please contact night-coverage [email or web address] Password [email or web address] 04/25/2016, 4:09 PM

## 2016-04-26 DIAGNOSIS — F10230 Alcohol dependence with withdrawal, uncomplicated: Secondary | ICD-10-CM

## 2016-04-26 LAB — BASIC METABOLIC PANEL
Anion gap: 5 (ref 5–15)
BUN: 10 mg/dL (ref 6–20)
CHLORIDE: 104 mmol/L (ref 101–111)
CO2: 24 mmol/L (ref 22–32)
Calcium: 8.1 mg/dL — ABNORMAL LOW (ref 8.9–10.3)
Creatinine, Ser: 0.55 mg/dL — ABNORMAL LOW (ref 0.61–1.24)
GFR calc Af Amer: >60 mL/min (ref >60)
GFR calc non Af Amer: >60 mL/min (ref >60)
GLUCOSE: 132 mg/dL — AB (ref 65–99)
POTASSIUM: 3.2 mmol/L — AB (ref 3.5–5.1)
Sodium: 133 mmol/L — ABNORMAL LOW (ref 135–145)

## 2016-04-26 LAB — CBC
HEMATOCRIT: 32.6 % — AB (ref 39.0–52.0)
HEMOGLOBIN: 11 g/dL — AB (ref 13.0–17.0)
MCH: 30.1 pg (ref 26.0–34.0)
MCHC: 33.7 g/dL (ref 30.0–36.0)
MCV: 89.3 fL (ref 78.0–100.0)
Platelets: 80 10*3/uL — ABNORMAL LOW (ref 150–400)
RBC: 3.65 MIL/uL — ABNORMAL LOW (ref 4.22–5.81)
RDW: 17.8 % — ABNORMAL HIGH (ref 11.5–15.5)
WBC: 5 10*3/uL (ref 4.0–10.5)

## 2016-04-26 LAB — MAGNESIUM: MAGNESIUM: 1.2 mg/dL — AB (ref 1.7–2.4)

## 2016-04-26 MED ORDER — MAGNESIUM SULFATE 4 GM/100ML IV SOLN
4.0000 g | Freq: Once | INTRAVENOUS | Status: AC
  Administered 2016-04-26: 4 g via INTRAVENOUS
  Filled 2016-04-26: qty 100

## 2016-04-26 MED ORDER — METOPROLOL SUCCINATE ER 25 MG PO TB24: 25.0000 mg | ORAL_TABLET | Freq: Every day | ORAL | 0 refills | Status: DC

## 2016-04-26 MED ORDER — POTASSIUM CHLORIDE CRYS ER 20 MEQ PO TBCR
40.0000 meq | EXTENDED_RELEASE_TABLET | Freq: Two times a day (BID) | ORAL | Status: DC
  Administered 2016-04-26: 40 meq via ORAL
  Filled 2016-04-26: qty 2

## 2016-04-26 MED ORDER — LORAZEPAM 2 MG/ML IJ SOLN
INTRAMUSCULAR | Status: AC
  Filled 2016-04-26: qty 1

## 2016-04-26 MED ORDER — LORAZEPAM 2 MG/ML IJ SOLN
2.0000 mg | INTRAMUSCULAR | Status: DC | PRN
Start: 2016-04-26 — End: 2016-04-26
  Administered 2016-04-26: 2 mg via INTRAVENOUS

## 2016-04-26 MED ORDER — AMLODIPINE BESYLATE 2.5 MG PO TABS: 2.5000 mg | ORAL_TABLET | Freq: Every day | ORAL | 0 refills | Status: DC

## 2016-04-26 MED ORDER — AMLODIPINE BESYLATE 5 MG PO TABS
2.5000 mg | ORAL_TABLET | Freq: Every day | ORAL | Status: DC
  Administered 2016-04-26: 2.5 mg via ORAL
  Filled 2016-04-26: qty 1

## 2016-04-26 NOTE — Discharge Summary (Signed)
Physician Discharge Summary  Wesley Williams ZOX:[id number] DOB: 06-14-1971 DOA: 04/22/2016  PCP: No PCP Per Patient  Admit date: 04/22/2016 Discharge date: 04/26/2016  Admitted From: Home Disposition:  Home  Recommendations for Outpatient Follow-up:  1. Follow up with PCP in 1-2 weeks 2. Reminded to avoid further alcohol intake  Discharge Condition:Improved CODE STATUS:Full Diet recommendation: Regular   Brief/Interim Summary: 45 y.o.malewith benign past medical history, but perhaps by virtue of hardly ever saw any physician, hx of alcohol abuse presented with complaints of leg cramps. PT has alcohol in his blood and had a syncope episode earlier today. EMS came buthe refused to come to the hospital at that time. Pt went home and began to have cramps in his legs with associateddiaphoresis and vomiting. Family brought him to the hospital. Pt states he drinks 5-6 beers a night. Pt denies chest pain, abdominal pain, or diarrhea. While in the ED he had an elevated creatinine of 2.49 with unclear baseline renal Fx, and a CBK of 1200. He was placed on IVFs and referred for further evaluation of AKI   1. Acute renal failure 1. Suspect secondary to markedly dehydration, prerenal renal failure 2. Renal function has normalized 3. Continue monitor renal function. 4. On 8/18, over 400cc on bladder scan. Started Flomax 5. Patient briefly required foley cath, later passed voiding trial 6. Renal failure resolved 2. Dehydration 1. Improved 3. Rhabdomyolysis 1. Peak CK of over 1200 2. Much improved 4. Syncope and collapse 1. Suspect related to ETOH intoxication given elevated ETOH level on admit. See below 5. Alcohol abuse with ETOH withdrawals 1. Presenting ETOH level of 233 2. Pt reports last ETOH intake was day prior to admission 3. Per family, patient has MARKED daily daily ETOH abuse. Family is aware of patient's ETOH abuse and has tried to convince him to cut back in the  past. 4. Patient failed CIWA protocol while on floor and required transfer to ICU with Precedex. 5. Patient had maintained airway while on Precedex. Precedex was weaned to off 6. Patient has remained stable 6. HTN 1. BP noted to be markedly elevated 2. Patient started on 25mg  metoprolol and 2.5mg  amlodipine for stage 3 HTN 3. Patient instructed to follow up with PCP for further BP med adjustment  Discharge Diagnoses:  Principal Problem:   AKI (acute kidney injury) (HCC) Active Problems:   Dehydration   Rhabdomyolysis   Syncope and collapse   Alcohol abuse   Alcohol withdrawal (HCC)    Discharge Instructions     Medication List    TAKE these medications   acetaminophen 500 MG tablet Commonly known as:  TYLENOL Take 1,000 mg by mouth every 6 (six) hours as needed for headache.   amLODipine 2.5 MG tablet Commonly known as:  NORVASC Take 1 tablet (2.5 mg total) by mouth daily.   metoprolol succinate 25 MG 24 hr tablet Commonly known as:  TOPROL XL Take 1 tablet (25 mg total) by mouth daily.      Follow-up Information    Follow up with your PCP in 1-2 weeks .          No Known Allergies  Consultations:  Discussed case with Psychiatry  Procedures/Studies: No results found.  Subjective: Very eager to go home today  Discharge Exam: Vitals:   04/26/16 1554 04/26/16 1600  BP:  (!) 178/102  Pulse: 89   Resp:  (!) 25  Temp:     Vitals:   04/26/16 1500 04/26/16 1540 04/26/16 1554 04/26/16  1600  BP: (!) 153/102 (!) 181/112  (!) 178/102  Pulse:   89   Resp: 20   (!) 25  Temp:      TempSrc:      SpO2:      Weight:      Height:        General: Pt is alert, awake, not in acute distress Cardiovascular: RRR, S1/S2 +, no rubs, no gallops Respiratory: CTA bilaterally, no wheezing, no rhonchi Abdominal: Soft, NT, ND, bowel sounds + Extremities: no edema, no cyanosis   The results of significant diagnostics from this hospitalization (including imaging,  microbiology, ancillary and laboratory) are listed below for reference.     Microbiology: Recent Results (from the past 240 hour(s))  MRSA PCR Screening     Status: None   Collection Time: 04/24/16 12:30 AM  Result Value Ref Range Status   MRSA by PCR NEGATIVE NEGATIVE Final    Comment:        The GeneXpert MRSA Assay (FDA approved for NASAL specimens only), is one component of a comprehensive MRSA colonization surveillance program. It is not intended to diagnose MRSA infection nor to guide or monitor treatment for MRSA infections.      Labs: BNP (last 3 results) No results for input(s): BNP in the last 8760 hours. Basic Metabolic Panel:  Recent Labs Lab 04/22/16 2330 04/23/16 0454 04/24/16 0553 04/25/16 0441 04/26/16 0453  NA 130* 134* 135 134* 133*  K 3.5 3.6 3.4* 3.0* 3.2*  CL 96* 106 104 105 104  CO2 17* 17* 23 23 24   GLUCOSE 146* 100* 117* 161* 132*  BUN 28* 23* 10 8 10   CREATININE 2.49* 1.16  1.23 0.58* 0.48* 0.55*  CALCIUM 9.2 8.1* 7.8* 8.3* 8.1*  MG 2.1  --   --  1.4* 1.2*   Liver Function Tests:  Recent Labs Lab 04/22/16 2330 04/25/16 0441  AST 137* 110*  ALT 39 41  ALKPHOS 112 97  BILITOT 2.1* 1.5*  PROT 10.8* 7.7  ALBUMIN 4.3 2.9*   No results for input(s): LIPASE, AMYLASE in the last 168 hours. No results for input(s): AMMONIA in the last 168 hours. CBC:  Recent Labs Lab 04/22/16 2330 04/23/16 0454 04/24/16 0553 04/25/16 0441 04/26/16 0453  WBC 5.6 4.4 3.4* 2.9* 5.0  NEUTROABS 4.3  --   --   --   --   HGB 12.3* 10.4* 10.2* 10.9* 11.0*  HCT 36.0* 30.5* 30.6* 32.5* 32.6*  MCV 87.4 87.9 89.2 89.5 89.3  PLT 96* 70* 67* 73* 80*   Cardiac Enzymes:  Recent Labs Lab 04/23/16 0031 04/23/16 1427 04/24/16 0553 04/25/16 0441  CKTOTAL 1,232* 1,119* 800* 429*   BNP: Invalid input(s): POCBNP CBG: No results for input(s): GLUCAP in the last 168 hours. D-Dimer No results for input(s): DDIMER in the last 72 hours. Hgb A1c No  results for input(s): HGBA1C in the last 72 hours. Lipid Profile No results for input(s): CHOL, HDL, LDLCALC, TRIG, CHOLHDL, LDLDIRECT in the last 72 hours. Thyroid function studies No results for input(s): TSH, T4TOTAL, T3FREE, THYROIDAB in the last 72 hours.  Invalid input(s): FREET3 Anemia work up No results for input(s): VITAMINB12, FOLATE, FERRITIN, TIBC, IRON, RETICCTPCT in the last 72 hours. Urinalysis No results found for: COLORURINE, APPEARANCEUR, LABSPEC, PHURINE, GLUCOSEU, HGBUR, BILIRUBINUR, KETONESUR, PROTEINUR, UROBILINOGEN, NITRITE, LEUKOCYTESUR Sepsis Labs Invalid input(s): PROCALCITONIN,  WBC,  LACTICIDVEN Microbiology Recent Results (from the past 240 hour(s))  MRSA PCR Screening     Status: None  Collection Time: 04/24/16 12:30 AM  Result Value Ref Range Status   MRSA by PCR NEGATIVE NEGATIVE Final    Comment:        The GeneXpert MRSA Assay (FDA approved for NASAL specimens only), is one component of a comprehensive MRSA colonization surveillance program. It is not intended to diagnose MRSA infection nor to guide or monitor treatment for MRSA infections.      SIGNED:   Jerald KiefHIU, STEPHEN K, MD  [facility] 04/26/2016, 4:40 PM  If 7PM-7AM, please contact night-coverage [email or web address] Password [email or web address]

## 2016-04-26 NOTE — Care Management Note (Signed)
Case Management Note  Patient Details  Name: Effie ShyJose A Caloca MRN: [id number] Date of Birth: [birth year]  Subjective/Objective:                  Pt admitted with ETOH abuse, rhabdo and syncope. He is from home, ind with ADL's. He has family that lives with him, he works two jobs, is not insured and does not have PCP. He has been seen by CSW and given information on ETOH abuse f/u. He is not interested in PCP follow up being arranged. FC aware of uninsured status. Anticipate DC home in next 24 hrs.   Action/Plan: Pt not interested in CM assistance with DC planning.   Expected Discharge Date:    04/26/2016              Expected Discharge Plan:  Home/Self Care  In-House Referral:  Clinical Social Work  Discharge planning Services  CM Consult  Post Acute Care Choice:  NA Choice offered to:  NA  DME Arranged:    DME Agency:     HH Arranged:    HH Agency:     Status of Service:  Completed, signed off  If discussed at [employer] Meetings, dates discussed:    Additional Comments:  Malcolm MetroChildress, Shalisa Mcquade Demske, RN 04/26/2016, 3:17 PM

## 2016-04-26 NOTE — Progress Notes (Signed)
MD notified of CIWA of 12 and pt request for something for the "shakes". New orders received and noted.

## 2016-04-26 NOTE — Progress Notes (Signed)
Orders received for discharge; instructions including medications given to pt. Pt verbalized understanding. PIV removed with no complications. Pt understands that he is to call to make appointment with PCP for follow up care. Pt taken out with all belongings via WC by NT.

## 2016-04-26 NOTE — Clinical Social Work Note (Signed)
Clinical Social Work Assessment  Patient Details  Name: Wesley ShyJose A Diantonio MRN: [id number] Date of Birth: [birth year]  Date of referral:  04/26/16               Reason for consult:  Substance Use/ETOH Abuse                Permission sought to share information with:    Permission granted to share information::     Name::        Agency::     Relationship::     Contact Information:     Housing/Transportation Living arrangements for the past 2 months:  Single Family Home Source of Information:  Patient Patient Interpreter Needed:  None Criminal Activity/Legal Involvement Pertinent to Current Situation/Hospitalization:  No - Comment as needed Significant Relationships:  Dependent Children, Significant Other Lives with:  Significant Other Do you feel safe going back to the place where you live?  Yes Need for family participation in patient care:  Yes (Comment)  Care giving concerns:  None identified.    Social Worker assessment / plan:  CSW discussed reason for referral being concerns related to alcohol use. Patient admitted to drinking approximately 4 beers today. He denied having an alcohol abuse problem. Patient advised that he resides in the home with his girlfriend and his 46 year old son. CSW completed SBIRT with patient.  CSW discussed treatment options and provided out patient treatment listing.  CSW offered to assist with appointment scheduling. Patient declined assistance and advised that he would call a treatment facility when he got home. CSW left contact information in the event that patient decided that he wanted assistance in appointment scheduling.  CSW signing off.   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 He desires to schedule his own appointment.   Patient/Family's Understanding of and Emotional Response to Diagnosis, Current Treatment, and Prognosis:  Patient denied being concerned about his alcohol use despite this hospitalization being related to issues with alcohol.   Emotional Assessment Appearance:  Appears stated age Attitude/Demeanor/Rapport:   (Cooperative and pleasant) Affect (typically observed):  Accepting Orientation:  Oriented to Self, Oriented to Place, Oriented to  Time, Oriented to Situation Alcohol / Substance use:  Alcohol Use Psych involvement (Current and /or in the community):  No (Comment)  Discharge Needs  Concerns to be addressed:  Substance Abuse Concerns Readmission within the last 30 days:  No Current discharge risk:  Substance Abuse Barriers to Discharge:  No Barriers Identified   Annice NeedySettle, Adisynn Suleiman D, LCSW 04/26/2016, 10:10 AM

## 2016-04-26 NOTE — Progress Notes (Signed)
Pt continues to get up from bed and chair without calling for assistance despite the fact that he has been educated about the dangers of falling. RN and NT utilizing chair and bed alarms to attempt to keep pt safe. Pt refuses to allow staff to stay in the room while he is using the bathroom.

## 2016-04-29 ENCOUNTER — Emergency Department (HOSPITAL_COMMUNITY)
Admission: EM | Admit: 2016-04-29 | Discharge: 2016-04-29 | Disposition: A | Discharge status: Home / Self Care | Payer: Self-pay | Attending: Emergency Medicine | Admitting: Emergency Medicine

## 2016-04-29 ENCOUNTER — Encounter (HOSPITAL_COMMUNITY): Payer: Self-pay | Admitting: *Deleted

## 2016-04-29 ENCOUNTER — Emergency Department (HOSPITAL_COMMUNITY): Payer: Self-pay

## 2016-04-29 DIAGNOSIS — I10 Essential (primary) hypertension: Secondary | ICD-10-CM | POA: Insufficient documentation

## 2016-04-29 DIAGNOSIS — Z791 Long term (current) use of non-steroidal anti-inflammatories (NSAID): Secondary | ICD-10-CM | POA: Insufficient documentation

## 2016-04-29 DIAGNOSIS — M25562 Pain in left knee: Secondary | ICD-10-CM | POA: Insufficient documentation

## 2016-04-29 MED ORDER — IBUPROFEN 600 MG PO TABS: 600.0000 mg | ORAL_TABLET | Freq: Three times a day (TID) | ORAL | 0 refills | Status: DC

## 2016-04-29 MED ORDER — OXYCODONE-ACETAMINOPHEN 5-325 MG PO TABS
1.0000 | ORAL_TABLET | Freq: Once | ORAL | Status: AC
  Administered 2016-04-29: 1 via ORAL
  Filled 2016-04-29: qty 1

## 2016-04-29 MED ORDER — IBUPROFEN 800 MG PO TABS
800.0000 mg | ORAL_TABLET | Freq: Once | ORAL | Status: AC
  Administered 2016-04-29: 800 mg via ORAL
  Filled 2016-04-29: qty 1

## 2016-04-29 MED ORDER — HYDROCODONE-ACETAMINOPHEN 5-325 MG PO TABS: ORAL_TABLET | ORAL | 0 refills | Status: DC

## 2016-04-29 NOTE — ED Triage Notes (Signed)
Pt comes in for left knee swelling starting yesterday. Pt states he has fluid built up on his knee and that this has happened before. Denies any injury, denies calf pain.

## 2016-04-29 NOTE — ED Notes (Signed)
Pt made aware to return if symptoms worsen or if any life threatening symptoms occur.   

## 2016-04-29 NOTE — Discharge Instructions (Signed)
Wear the ace wrap as needed when weight bearing.  Call Dr. Mort SawyersHarrison's office to arrange a follow-up appt

## 2016-05-01 NOTE — ED Provider Notes (Signed)
[facility] Provider Note   CSN: [id number] Arrival date & time: 04/29/16  1314     History   Chief Complaint Chief Complaint  Patient presents with  . Knee Pain    HPI Wesley Williams is a 45 y.o. male.  HPI   Wesley Williams is a 45 y.o. male who presents to the Emergency Department complaining of left knee pain for one day.  He was admitted to this hospital on 04/22/16 for dehydration, acute renal failure and syncope.  He was later discharged on 04/26/16.  He reports knee pain that he describes as throbbing and he is having pain with attempted flexion.  He admits to previous swelling of the knee which required aspiration.  He denies injury, redness, fever, chills, numbness or weakness of the leg.  He has not taken any medication for his symptoms   Past Medical History:  Diagnosis Date  . Alcohol abuse   . Hypertension     Patient Active Problem List   Diagnosis Date Noted  . Alcohol withdrawal (HCC) 04/25/2016  . AKI (acute kidney injury) (HCC) 04/23/2016  . Dehydration 04/23/2016  . Rhabdomyolysis 04/23/2016  . Syncope and collapse 04/23/2016  . Alcohol abuse 04/23/2016    History reviewed. No pertinent surgical history.     Home Medications    Prior to Admission medications   Medication Sig Start Date End Date Taking? Authorizing Provider  acetaminophen (TYLENOL) 500 MG tablet Take 1,000 mg by mouth every 6 (six) hours as needed for headache.    Historical Provider, MD  amLODipine (NORVASC) 2.5 MG tablet Take 1 tablet (2.5 mg total) by mouth daily. 04/26/16   Jerald Kief, MD  HYDROcodone-acetaminophen (NORCO/VICODIN) 5-325 MG tablet Take one-two tabs po q 4-6 hrs prn pain 04/29/16   Jamin Panther, PA-C  ibuprofen (ADVIL,MOTRIN) 600 MG tablet Take 1 tablet (600 mg total) by mouth 3 (three) times daily. Take with food 04/29/16   Kanton Kamel, PA-C  metoprolol succinate (TOPROL XL) 25 MG 24 hr tablet Take 1 tablet (25 mg total) by mouth daily. 04/26/16    Jerald Kief, MD    Family History No family history on file.  Social History Social History  Substance Use Topics  . Smoking status: Never Smoker  . Smokeless tobacco: Never Used  . Alcohol use 7.2 oz/week    12 Cans of beer per week     Comment: daily 12 pk     Allergies   Review of patient's allergies indicates no known allergies.   Review of Systems Review of Systems  Constitutional: Negative for chills and fever.  Respiratory: Negative for shortness of breath.   Cardiovascular: Negative for chest pain.  Gastrointestinal: Negative for vomiting.  Genitourinary: Negative for difficulty urinating and dysuria.  Musculoskeletal: Positive for arthralgias (left knee pain) and joint swelling.  Skin: Negative for color change and wound.  Neurological: Negative for weakness and numbness.  All other systems reviewed and are negative.    Physical Exam Updated Vital Signs BP (!) 166/106   Pulse 75   Temp 98.9 F (37.2 C) (Oral)   Resp 16   SpO2 100%   Physical Exam  Constitutional: He is oriented to person, place, and time. He appears well-developed and well-nourished. No distress.  Cardiovascular: Normal rate, regular rhythm and intact distal pulses.   Pulmonary/Chest: Effort normal and breath sounds normal.  Musculoskeletal: He exhibits tenderness.  Diffuse ttp of the anterior left knee.  No erythema, excessive warmth, effusion,  or step-off deformity.  Mild crepitus on ROM.  DP pulse brisk, distal sensation intact. Calf is soft and NT.  Neurological: He is alert and oriented to person, place, and time. He exhibits normal muscle tone. Coordination normal.  Skin: Skin is warm and dry. No erythema.  Nursing note and vitals reviewed.    ED Treatments / Results  Labs (all labs ordered are listed, but only abnormal results are displayed) Labs Reviewed - No data to display  EKG  EKG Interpretation None       Radiology Dg Knee Complete 4 Views Left  Result  Date: 04/29/2016 CLINICAL DATA:  Pain without trauma. EXAM: LEFT KNEE - COMPLETE 4+ VIEW COMPARISON:  None. FINDINGS: No fracture or dislocation. Degenerative changes seen most prominent in the medial and patellofemoral compartments. No other acute abnormalities. IMPRESSION: Degenerative change.  No acute fracture. Electronically Signed   By: Gerome Samavid  Williams III M.D   On: 04/29/2016 14:53    Procedures Procedures (including critical care time)  Medications Ordered in ED Medications  oxyCODONE-acetaminophen (PERCOCET/ROXICET) 5-325 MG per tablet 1 tablet (1 tablet Oral Given 04/29/16 1504)  ibuprofen (ADVIL,MOTRIN) tablet 800 mg (800 mg Oral Given 04/29/16 1504)     Initial Impression / Assessment and Plan / ED Course  I have reviewed the triage vital signs and the nursing notes.  Pertinent labs & imaging results that were available during my care of the patient were reviewed by me and considered in my medical decision making (see chart for details).  Clinical Course   Pt with recurrent pain of the left knee.  No effusion clinically, no concerning sx's for septic joint.  Pain likely related to deg changes.  Pt agrees to symptomatic tx and orthopedic f/u.  Referral given  ACE wrap applied.  Remains NV intact  Final Clinical Impressions(s) / ED Diagnoses   Final diagnoses:  Knee pain, acute, left    New Prescriptions Discharge Medication List as of 04/29/2016  3:15 PM    START taking these medications   Details  HYDROcodone-acetaminophen (NORCO/VICODIN) 5-325 MG tablet Take one-two tabs po q 4-6 hrs prn pain, Print    ibuprofen (ADVIL,MOTRIN) 600 MG tablet Take 1 tablet (600 mg total) by mouth 3 (three) times daily. Take with food, Starting Thu 04/29/2016, Print         Yeraldine Forney Holcombriplett, [place] JerseyPA-C 05/01/16 [id number]    Rolland PorterMark James, MD 05/05/16 [phone]

## 2016-06-18 ENCOUNTER — Encounter (HOSPITAL_COMMUNITY): Payer: Self-pay | Admitting: Emergency Medicine

## 2016-06-18 ENCOUNTER — Emergency Department (HOSPITAL_COMMUNITY): Payer: Self-pay

## 2016-06-18 ENCOUNTER — Emergency Department (HOSPITAL_COMMUNITY)
Admission: EM | Admit: 2016-06-18 | Discharge: 2016-06-18 | Disposition: A | Discharge status: Home / Self Care | Payer: Self-pay | Attending: Emergency Medicine | Admitting: Emergency Medicine

## 2016-06-18 DIAGNOSIS — I10 Essential (primary) hypertension: Secondary | ICD-10-CM | POA: Insufficient documentation

## 2016-06-18 DIAGNOSIS — Z79899 Other long term (current) drug therapy: Secondary | ICD-10-CM | POA: Insufficient documentation

## 2016-06-18 DIAGNOSIS — M545 Low back pain: Secondary | ICD-10-CM

## 2016-06-18 DIAGNOSIS — Z791 Long term (current) use of non-steroidal anti-inflammatories (NSAID): Secondary | ICD-10-CM | POA: Insufficient documentation

## 2016-06-18 DIAGNOSIS — Z760 Encounter for issue of repeat prescription: Secondary | ICD-10-CM

## 2016-06-18 LAB — BASIC METABOLIC PANEL
Anion gap: 5 (ref 5–15)
BUN: 15 mg/dL (ref 6–20)
CHLORIDE: 109 mmol/L (ref 101–111)
CO2: 23 mmol/L (ref 22–32)
CREATININE: 0.58 mg/dL — AB (ref 0.61–1.24)
Calcium: 8.8 mg/dL — ABNORMAL LOW (ref 8.9–10.3)
GFR calc Af Amer: >60 mL/min (ref >60)
GFR calc non Af Amer: >60 mL/min (ref >60)
GLUCOSE: 95 mg/dL (ref 65–99)
Potassium: 4 mmol/L (ref 3.5–5.1)
Sodium: 137 mmol/L (ref 135–145)

## 2016-06-18 LAB — URINALYSIS, ROUTINE W REFLEX MICROSCOPIC
BILIRUBIN URINE: NEGATIVE
GLUCOSE, UA: NEGATIVE mg/dL
HGB URINE DIPSTICK: NEGATIVE
Ketones, ur: NEGATIVE mg/dL
Leukocytes, UA: NEGATIVE
Nitrite: NEGATIVE
PH: 5.5 (ref 5.0–8.0)
Protein, ur: NEGATIVE mg/dL
SPECIFIC GRAVITY, URINE: 1.025 (ref 1.005–1.030)

## 2016-06-18 MED ORDER — METHOCARBAMOL 500 MG PO TABS: 500.0000 mg | ORAL_TABLET | Freq: Four times a day (QID) | ORAL | 0 refills | Status: DC | PRN

## 2016-06-18 MED ORDER — TAMSULOSIN HCL 0.4 MG PO CAPS: 0.4000 mg | ORAL_CAPSULE | Freq: Every day | ORAL | 0 refills | Status: DC

## 2016-06-18 MED ORDER — AMLODIPINE BESYLATE 2.5 MG PO TABS: 2.5000 mg | ORAL_TABLET | Freq: Every day | ORAL | 0 refills | Status: DC

## 2016-06-18 MED ORDER — METOPROLOL SUCCINATE ER 25 MG PO TB24: 25.0000 mg | ORAL_TABLET | Freq: Every day | ORAL | 0 refills | Status: DC

## 2016-06-18 MED ORDER — LIDOCAINE 5 % EX PTCH: 1.0000 | MEDICATED_PATCH | CUTANEOUS | 0 refills | Status: DC

## 2016-06-18 NOTE — ED Provider Notes (Signed)
[facility] Provider Note   CSN: [id number] Arrival date & time: 06/18/16  1143     History   Chief Complaint Chief Complaint  Patient presents with  . Back Pain    HPI Wesley Williams is a 45 y.o. male.  HPI   Pt with hx alcoholism, hospitalization 04/2016 for acute renal failure, rhabdomyolysis, dehydration, syncope, ETOH abuse and withdrawal, hypertension p/w low back pain x 2-3 weeks without known injury.  Pain involves the lower back, occasionally radiates down either leg, exacerbated by movement.  Denies fevers, loss of control of bowel or bladder, weakness or numbness of the extremities, abdominal pain.  Does have difficulty urinating occasionally, difficulty initiating stream. He was started on flomax as well as antihypertensive medications during his recent hospitalizations but he has run out of those medications and has not found a primary care provider to see or to refill them.    Past Medical History:  Diagnosis Date  . Alcohol abuse   . Hypertension     Patient Active Problem List   Diagnosis Date Noted  . Alcohol withdrawal (HCC) 04/25/2016  . AKI (acute kidney injury) (HCC) 04/23/2016  . Dehydration 04/23/2016  . Rhabdomyolysis 04/23/2016  . Syncope and collapse 04/23/2016  . Alcohol abuse 04/23/2016    History reviewed. No pertinent surgical history.     Home Medications    Prior to Admission medications   Medication Sig Start Date End Date Taking? Authorizing Provider  acetaminophen (TYLENOL) 500 MG tablet Take 1,000 mg by mouth every 6 (six) hours as needed for headache.    Historical Provider, MD  amLODipine (NORVASC) 2.5 MG tablet Take 1 tablet (2.5 mg total) by mouth daily. 06/18/16   Trixie Dredge, PA-C  ibuprofen (ADVIL,MOTRIN) 600 MG tablet Take 1 tablet (600 mg total) by mouth 3 (three) times daily. Take with food 04/29/16   Tammy Triplett, PA-C  lidocaine (LIDODERM) 5 % Place 1 patch onto the skin daily. Remove & Discard patch within 12  hours or as directed by MD 06/18/16   Trixie Dredge, PA-C  methocarbamol (ROBAXIN) 500 MG tablet Take 1-2 tablets (500-1,000 mg total) by mouth every 6 (six) hours as needed for muscle spasms (and pain). 06/18/16   Trixie Dredge, PA-C  metoprolol succinate (TOPROL XL) 25 MG 24 hr tablet Take 1 tablet (25 mg total) by mouth daily. 06/18/16   Trixie Dredge, PA-C  tamsulosin (FLOMAX) 0.4 MG CAPS capsule Take 1 capsule (0.4 mg total) by mouth daily. 06/18/16   Trixie Dredge, PA-C    Family History History reviewed. No pertinent family history.  Social History Social History  Substance Use Topics  . Smoking status: Never Smoker  . Smokeless tobacco: Never Used  . Alcohol use No     Comment: daily 12 pk     Allergies   Review of patient's allergies indicates no known allergies.   Review of Systems Review of Systems  All other systems reviewed and are negative.    Physical Exam Updated Vital Signs BP 141/86 (BP Location: Left Arm)   Pulse 77   Temp 98.1 F (36.7 C) (Oral)   Resp 16   Ht 5\' 5"  (1.651 m)   Wt 81.6 kg   SpO2 100%   BMI 29.95 kg/m   Physical Exam  Constitutional: He appears well-developed and well-nourished. No distress.  HENT:  Head: Normocephalic and atraumatic.  Neck: Neck supple.  Pulmonary/Chest: Effort normal.  Abdominal: Soft. He exhibits no distension. There is no tenderness. There  is no rebound and no guarding.  Musculoskeletal:  Spine nontender, no crepitus, or stepoffs.  Tenderness across lower back.  Lower extremities:  Strength 5/5, sensation intact, distal pulses intact.   Gait is normal.    Neurological: He is alert.  Skin: He is not diaphoretic.  Nursing note and vitals reviewed.    ED Treatments / Results  Labs (all labs ordered are listed, but only abnormal results are displayed) Labs Reviewed  BASIC METABOLIC PANEL - Abnormal; Notable for the following:       Result Value   Creatinine, Ser 0.58 (*)    Calcium 8.8 (*)    All other  components within normal limits  URINALYSIS, ROUTINE W REFLEX MICROSCOPIC (NOT AT [facility])    EKG  EKG Interpretation None       Radiology Dg Lumbar Spine Complete  Result Date: 06/18/2016 CLINICAL DATA:  Lumbago with bilateral lower extremity radicular symptoms for 3 months EXAM: LUMBAR SPINE: COMPLETE COMPARISON:  None FINDINGS: Frontal, lateral, spot lumbosacral lateral, and bilateral oblique views were obtained. There are 5 non-rib-bearing lumbar-type vertebral bodies. There is no fracture or spondylolisthesis. Disc spaces appear normal. There are anterior osteophytes at L3, L4, and L5. There is facet osteoarthritic change at L5-S1 bilaterally. There are foci of calcification in abdominal aorta. 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 No fracture or spondylolisthesis. There is aortic atherosclerosis. Electronically Signed   By: Bretta Bang III M.D.   On: 06/18/2016 13:23    Procedures Procedures (including critical care time)  Medications Ordered in ED Medications - No data to display   Initial Impression / Assessment and Plan / ED Course  I have reviewed the triage vital signs and the nursing notes.  Pertinent labs & imaging results that were available during my care of the patient were reviewed by me and considered in my medical decision making (see chart for details).  Clinical Course    Afebrile nontoxic patient with hx alcoholism (not currently drinking) with c/o low back pain.  Pt was admitted for renal failure in August, has not follow up with PCP and is no longer taking prescribed medications. BNP demonstrates normal renal function, UA not infected.  Pt given prescription refills of the medications for HTN and urinary retention he was given at discharge from his hospitalization, advised to monitor his blood pressure closely, find a primary care provider ASAP.  Medications also given for low back pain.  Xray with mild OA.  No red flags for back pain.  Resources  given.  D/C home. Discussed result, findings, treatment, and follow up  with patient.  Pt given return precautions.  Pt verbalizes understanding and agrees with plan.      Final Clinical Impressions(s) / ED Diagnoses   Final diagnoses:  Acute bilateral low back pain, with sciatica presence unspecified  Medication refill    New Prescriptions Discharge Medication List as of 06/18/2016  1:40 PM    START taking these medications   Details  lidocaine (LIDODERM) 5 % Place 1 patch onto the skin daily. Remove & Discard patch within 12 hours or as directed by MD, Starting Fri 06/18/2016, Print    methocarbamol (ROBAXIN) 500 MG tablet Take 1-2 tablets (500-1,000 mg total) by mouth every 6 (six) hours as needed for muscle spasms (and pain)., Starting Fri 06/18/2016, Print    tamsulosin (FLOMAX) 0.4 MG CAPS capsule Take 1 capsule (0.4 mg total) by mouth daily., Starting Fri 06/18/2016, Print  Trixie Dredgemily Sadaf Przybysz, PA-C 06/18/16 1424    Vanetta MuldersScott Zackowski, MD 06/19/16 [phone]

## 2016-06-18 NOTE — ED Notes (Signed)
C/o back pain for last 3 weeks.  Denies any injury or past history of back pain.  Rates pain 10/10.  Have only treated self with BC powders without relief.

## 2016-06-18 NOTE — ED Triage Notes (Signed)
Pt reports onset of low back pain 3 weeks ago. No known injury. Pt reports radiation down his L leg, states the pain is occas down both legs. Pt also reports L knee pain, seen here for same 2 weeks ago.

## 2016-06-18 NOTE — Discharge Instructions (Signed)
Read the information below.  Use the prescribed medication as directed.  Please discuss all new medications with your pharmacist.  You may return to the Emergency Department at any time for worsening condition or any new symptoms that concern you.    If you develop fevers, loss of control of bowel or bladder, weakness or numbness in your legs, or are unable to walk, return to the ER for a recheck.   It is very important that you find a primary care provider for follow up and for continued management of your blood pressure and evaluation of your urinary problems.  Please check your blood pressure regularly.  If you become lightheaded or dizzy, weak, or develop chest pain or difficulty breathing call 911 or return to the Emergency Department as soon as possible for further evaluation.

## 2017-11-26 ENCOUNTER — Ambulatory Visit (INDEPENDENT_AMBULATORY_CARE_PROVIDER_SITE_OTHER): Payer: Self-pay | Admitting: Emergency Medicine

## 2017-11-26 ENCOUNTER — Encounter: Payer: Self-pay | Admitting: Emergency Medicine

## 2017-11-26 VITALS — BP 157/85 | HR 100 | Temp 98.1°F | Resp 17 | Ht 67.0 in | Wt 200.0 lb

## 2017-11-26 DIAGNOSIS — R519 Headache, unspecified: Secondary | ICD-10-CM | POA: Insufficient documentation

## 2017-11-26 DIAGNOSIS — R51 Headache: Secondary | ICD-10-CM

## 2017-11-26 DIAGNOSIS — R309 Painful micturition, unspecified: Secondary | ICD-10-CM | POA: Insufficient documentation

## 2017-11-26 DIAGNOSIS — F1010 Alcohol abuse, uncomplicated: Secondary | ICD-10-CM

## 2017-11-26 LAB — POCT URINALYSIS DIP (MANUAL ENTRY)
Glucose, UA: NEGATIVE mg/dL
Nitrite, UA: NEGATIVE
PH UA: 5.5 (ref 5.0–8.0)
SPEC GRAV UA: 1.015 (ref 1.010–1.025)
Urobilinogen, UA: >=8 E.U./dL — AB

## 2017-11-26 MED ORDER — CIPROFLOXACIN HCL 500 MG PO TABS: 500.0000 mg | ORAL_TABLET | Freq: Two times a day (BID) | ORAL | 0 refills | Status: AC

## 2017-11-26 NOTE — Progress Notes (Signed)
Wesley Williams 47 y.o.   Chief Complaint  Patient presents with  . Establish Care    HISTORY OF PRESENT ILLNESS: This is a 47 y.o. male here to establish care.  Patient complains of a chronic diffuse nonspecific headache on and off for the past several months.  Also complains of some urinary discomfort on and off for several months as well.  He is a chronic EtOH user.  Has a history of hypertension.  HPI   Prior to Admission medications   Medication Sig Start Date End Date Taking? Authorizing Provider  acetaminophen (TYLENOL) 500 MG tablet Take 1,000 mg by mouth every 6 (six) hours as needed for headache.    [provider]  amLODipine (NORVASC) 2.5 MG tablet Take 1 tablet (2.5 mg total) by mouth daily. Patient not taking: Reported on 11/26/2017 06/18/16   Trixie Dredge, PA-C  ibuprofen (ADVIL,MOTRIN) 600 MG tablet Take 1 tablet (600 mg total) by mouth 3 (three) times daily. Take with food Patient not taking: Reported on 11/26/2017 04/29/16   Triplett, Tammy, PA-C  lidocaine (LIDODERM) 5 % Place 1 patch onto the skin daily. Remove & Discard patch within 12 hours or as directed by MD Patient not taking: Reported on 11/26/2017 06/18/16   Trixie Dredge, PA-C  methocarbamol (ROBAXIN) 500 MG tablet Take 1-2 tablets (500-1,000 mg total) by mouth every 6 (six) hours as needed for muscle spasms (and pain). Patient not taking: Reported on 11/26/2017 06/18/16   Trixie Dredge, PA-C  metoprolol succinate (TOPROL XL) 25 MG 24 hr tablet Take 1 tablet (25 mg total) by mouth daily. Patient not taking: Reported on 11/26/2017 06/18/16   Trixie Dredge, PA-C  tamsulosin (FLOMAX) 0.4 MG CAPS capsule Take 1 capsule (0.4 mg total) by mouth daily. Patient not taking: Reported on 11/26/2017 06/18/16   Trixie Dredge, PA-C    No Known Allergies  Patient Active Problem List   Diagnosis Date Noted  . Alcohol withdrawal (HCC) 04/25/2016  . AKI (acute kidney injury) (HCC) 04/23/2016  . Dehydration 04/23/2016  .  Rhabdomyolysis 04/23/2016  . Syncope and collapse 04/23/2016  . Alcohol abuse 04/23/2016    Past Medical History:  Diagnosis Date  . Alcohol abuse   . Hypertension     No past surgical history on file.  Social History   Socioeconomic History  . Marital status: Single    Spouse name: Not on file  . Number of children: Not on file  . Years of education: Not on file  . Highest education level: Not on file  Occupational History  . Not on file  Social Needs  . Financial resource strain: Not on file  . Food insecurity:    Worry: Not on file    Inability: Not on file  . Transportation needs:    Medical: Not on file    Non-medical: Not on file  Tobacco Use  . Smoking status: Never Smoker  . Smokeless tobacco: Never Used  Substance and Sexual Activity  . Alcohol use: No    Alcohol/week: 7.2 oz    Types: 12 Cans of beer per week    Comment: daily 12 pk  . Drug use: No  . Sexual activity: Yes  Lifestyle  . Physical activity:    Days per week: Not on file    Minutes per session: Not on file  . Stress: Not on file  Relationships  . Social connections:    Talks on phone: Not on file    Gets together: Not  on file    Attends religious service: Not on file    Active member of club or organization: Not on file    Attends meetings of clubs or organizations: Not on file    Relationship status: Not on file  . Intimate partner violence:    Fear of current or ex partner: Not on file    Emotionally abused: Not on file    Physically abused: Not on file    Forced sexual activity: Not on file  Other Topics Concern  . Not on file  Social History Narrative  . Not on file    No family history on file.   Review of Systems  Constitutional: Negative.  Negative for chills, fever and malaise/fatigue.  HENT: Negative.  Negative for congestion, nosebleeds and sore throat.   Eyes: Negative.  Negative for blurred vision and double vision.  Respiratory: Negative.  Negative for cough,  hemoptysis and shortness of breath.   Cardiovascular: Negative.  Negative for chest pain, palpitations and leg swelling.  Gastrointestinal: Negative.  Negative for abdominal pain, diarrhea, heartburn, nausea and vomiting.  Genitourinary: Negative.  Negative for flank pain and hematuria.       Urinary pain (intermittent).  Musculoskeletal: Negative for myalgias and neck pain.  Skin: Negative.  Negative for rash.  Neurological: Positive for headaches.  Endo/Heme/Allergies: Negative.   All other systems reviewed and are negative.  Vitals:   11/26/17 1405  BP: (!) 157/85  Pulse: 100  Resp: 17  Temp: 98.1 F (36.7 C)  SpO2: 98%     Physical Exam  Constitutional: He is oriented to person, place, and time. He appears well-developed and well-nourished.  HENT:  Head: Normocephalic and atraumatic.  Nose: Nose normal.  Mouth/Throat: Oropharynx is clear and moist.  Eyes: Pupils are equal, round, and reactive to light. Conjunctivae and EOM are normal.  Slightly icteric sclerae  Neck: Normal range of motion. Neck supple. No thyromegaly present.  Cardiovascular: Normal rate and regular rhythm.  Pulmonary/Chest: Effort normal and breath sounds normal.  Abdominal: Soft. There is no tenderness.  Positive hepatomegaly  Musculoskeletal: Normal range of motion.  Lymphadenopathy:    He has no cervical adenopathy.  Neurological: He is alert and oriented to person, place, and time. No sensory deficit. He exhibits normal muscle tone.  Skin: Skin is warm and dry. Capillary refill takes less than 2 seconds. No rash noted.  Psychiatric: He has a normal mood and affect. His behavior is normal.  Vitals reviewed.    ASSESSMENT & PLAN: [name] was seen today for establish care.  Diagnoses and all orders for this visit:  Nonintractable headache, unspecified chronicity pattern, unspecified headache type -     CBC with Differential/Platelet -     Comprehensive metabolic panel  Alcohol abuse -      CBC with Differential/Platelet -     Comprehensive metabolic panel  Urinary pain -     Urine Culture -     POCT urinalysis dipstick -     ciprofloxacin (CIPRO) 500 MG tablet; Take 1 tablet (500 mg total) by mouth 2 (two) times daily for 7 days.    Patient Instructions       IF you received an x-ray today, you will receive an invoice from [facility]. Please contact [facility] at [phone] with questions or concerns regarding your invoice.   IF you received labwork today, you will receive an invoice from [facility]. Please contact [facility] at [phone] with questions or concerns regarding your invoice.  Our billing staff will not be able to assist you with questions regarding bills from these companies.  You will be contacted with the lab results as soon as they are available. The fastest way to get your results is to activate your My Chart account. Instructions are located on the last page of this paperwork. If you have not heard from [place] regarding the results in 2 weeks, please contact this office.      Dolor de cabeza general sin causa (General Headache Without Cause) El dolor de cabeza es un dolor o malestar que se siente en la zona de la cabeza o del cuello. Hay muchas causas y tipos de dolores de [place]. En algunos casos, es posible que no se encuentre la causa. CUIDADOS EN EL HOGAR Control del [employer] de venta libre y los recetados solamente como se lo haya indicado el mdico.  Cuando sienta dolor de cabeza acustese en un cuarto oscuro y tranquilo.  Si se lo indican, aplique hielo sobre la cabeza y la zona del cuello: ? Ponga el hielo en una bolsa plstica. ? Coloque una [employer] piel y la bolsa de hielo. ? Coloque el hielo durante , 2 a 3veces por da.  Utilice una almohadilla trmica o tome una ducha con agua caliente para aplicar calor en la cabeza y la zona del cuello como se lo haya indicado el  mdico.  Mantenga las luces tenues si le [employer] luces brillantes o sus dolores de cabeza empeoran. Comida y bebida  Mantenga un horario para las comidas.  Beba menos alcohol.  Consuma menos o deje de tomar cafena. Instrucciones generales  Concurra a todas las visitas de control como se lo haya indicado el mdico. Esto es importante.  Lleve un registro diario para Financial risk analyst si ciertas cosas provocan los dolores de [place]. Por ejemplo, escriba los siguientes datos: ? Lo que usted come y bebe. ? Cunto tiempo duerme. ? Algn cambio en su dieta o en los medicamentos.  Realice actividades relajantes, como recibir [place].  Disminuya el nivel de estrs.  Sintese con la espalda recta. No contraiga (tensione) los msculos.  No consuma productos que contengan tabaco. Estos incluyen cigarrillos, tabaco para mascar y Administrator, Civil Service. Si necesita ayuda para dejar de fumar, consulte al mdico.  Haga ejercicios con regularidad tal como se lo indic el mdico.  Duerma lo suficiente. Esto a menudo significa entre 7 y 9horas de sueo. SOLICITE AYUDA SI:  Los medicamentos no logran [employer].  Tiene un dolor de cabeza que es diferente a los otros dolores de [place].  Tiene malestar estomacal (nuseas) o vomita.  Tiene fiebre. SOLICITE AYUDA DE INMEDIATO SI:  El dolor de [place].  Sigue vomitando.  Presenta rigidez en el cuello.  Tiene dificultad para ver.  Tiene dificultad para hablar.  Siente dolor en el ojo o en el odo.  Sus msculos estn dbiles, o pierde el control muscular.  Pierde el equilibrio o tiene problemas para Advertising account planner.  Siente que se desvanece (pierde el conocimiento) o se desmaya.  Se siente confundido. Esta informacin no tiene Theme park manager el consejo del mdico. Asegrese de hacerle al mdico cualquier pregunta que tenga. Document Released: 11/15/2011 Document Revised: 05/14/2015 Document Reviewed:  12/16/2014 Elsevier Interactive Patient Education  2018 [employer].      Edwina Barth, MD [facility]

## 2017-11-26 NOTE — Patient Instructions (Addendum)
   IF you received an x-ray today, you will receive an invoice from [facility]. Please contact [facility] at [phone] with questions or concerns regarding your invoice.   IF you received labwork today, you will receive an invoice from [facility]. Please contact [facility] at [phone] with questions or concerns regarding your invoice.   Our billing staff will not be able to assist you with questions regarding bills from these companies.  You will be contacted with the lab results as soon as they are available. The fastest way to get your results is to activate your My Chart account. Instructions are located on the last page of this paperwork. If you have not heard from us regarding the results in 2 weeks, please contact this office.      Dolor de cabeza general sin causa (General Headache Without Cause) El dolor de cabeza es un dolor o malestar que se siente en la zona de la cabeza o del cuello. Hay muchas causas y tipos de dolores de cabeza. En algunos casos, es posible que no se encuentre la causa. CUIDADOS EN EL HOGAR Control del dolor  Tome los medicamentos de venta libre y los recetados solamente como se lo haya indicado el mdico.  Cuando sienta dolor de cabeza acustese en un cuarto oscuro y tranquilo.  Si se lo indican, aplique hielo sobre la cabeza y la zona del cuello: ? Ponga el hielo en una bolsa plstica. ? Coloque una toalla entre la piel y la bolsa de hielo. ? Coloque el hielo durante 20minutos, 2 a 3veces por da.  Utilice una almohadilla trmica o tome una ducha con agua caliente para aplicar calor en la cabeza y la zona del cuello como se lo haya indicado el mdico.  Mantenga las luces tenues si le molesta las luces brillantes o sus dolores de cabeza empeoran. Comida y bebida  Mantenga un horario para las comidas.  Beba menos alcohol.  Consuma menos o deje de tomar cafena. Instrucciones generales  Concurra a todas las visitas de  control como se lo haya indicado el mdico. Esto es importante.  Lleve un registro diario para averiguar si ciertas cosas provocan los dolores de cabeza. Por ejemplo, escriba los siguientes datos: ? Lo que usted come y bebe. ? Cunto tiempo duerme. ? Algn cambio en su dieta o en los medicamentos.  Realice actividades relajantes, como recibir masajes.  Disminuya el nivel de estrs.  Sintese con la espalda recta. No contraiga (tensione) los msculos.  No consuma productos que contengan tabaco. Estos incluyen cigarrillos, tabaco para mascar y cigarrillos electrnicos. Si necesita ayuda para dejar de fumar, consulte al mdico.  Haga ejercicios con regularidad tal como se lo indic el mdico.  Duerma lo suficiente. Esto a menudo significa entre 7 y 9horas de sueo. SOLICITE AYUDA SI:  Los medicamentos no logran aliviar los sntomas.  Tiene un dolor de cabeza que es diferente a los otros dolores de cabeza.  Tiene malestar estomacal (nuseas) o vomita.  Tiene fiebre. SOLICITE AYUDA DE INMEDIATO SI:  El dolor de cabeza empeora.  Sigue vomitando.  Presenta rigidez en el cuello.  Tiene dificultad para ver.  Tiene dificultad para hablar.  Siente dolor en el ojo o en el odo.  Sus msculos estn dbiles, o pierde el control muscular.  Pierde el equilibrio o tiene problemas para caminar.  Siente que se desvanece (pierde el conocimiento) o se desmaya.  Se siente confundido. Esta informacin no tiene como fin reemplazar el consejo del mdico.   Asegrese de hacerle al mdico cualquier pregunta que tenga. Document Released: 11/15/2011 Document Revised: 05/14/2015 Document Reviewed: 12/16/2014 Elsevier Interactive Patient Education  2018 Elsevier Inc.  

## 2017-11-27 LAB — COMPREHENSIVE METABOLIC PANEL
ALK PHOS: 112 IU/L (ref 39–117)
ALT: 26 IU/L (ref 0–44)
AST: 75 IU/L — AB (ref 0–40)
Albumin/Globulin Ratio: 0.5 — ABNORMAL LOW (ref 1.2–2.2)
Albumin: 3.1 g/dL — ABNORMAL LOW (ref 3.5–5.5)
BUN/Creatinine Ratio: 20 (ref 9–20)
BUN: 11 mg/dL (ref 6–24)
Bilirubin Total: 1.7 mg/dL — ABNORMAL HIGH (ref 0.0–1.2)
CALCIUM: 8.6 mg/dL — AB (ref 8.7–10.2)
CHLORIDE: 98 mmol/L (ref 96–106)
CO2: 20 mmol/L (ref 20–29)
Creatinine, Ser: 0.55 mg/dL — ABNORMAL LOW (ref 0.76–1.27)
GFR calc Af Amer: 143 mL/min/{1.73_m2} (ref >59)
GFR, EST NON AFRICAN AMERICAN: 124 mL/min/{1.73_m2} (ref >59)
Globulin, Total: 5.7 g/dL — ABNORMAL HIGH (ref 1.5–4.5)
Glucose: 115 mg/dL — ABNORMAL HIGH (ref 65–99)
POTASSIUM: 3.9 mmol/L (ref 3.5–5.2)
Sodium: 132 mmol/L — ABNORMAL LOW (ref 134–144)
Total Protein: 8.8 g/dL — ABNORMAL HIGH (ref 6.0–8.5)

## 2017-11-27 LAB — CBC WITH DIFFERENTIAL/PLATELET
BASOS: 1 %
Basophils Absolute: 0 10*3/uL (ref 0.0–0.2)
EOS (ABSOLUTE): 0.2 10*3/uL (ref 0.0–0.4)
Eos: 3 %
Hematocrit: 25.7 % — ABNORMAL LOW (ref 37.5–51.0)
Hemoglobin: 7.8 g/dL — ABNORMAL LOW (ref 13.0–17.7)
IMMATURE GRANULOCYTES: 0 %
Immature Grans (Abs): 0 10*3/uL (ref 0.0–0.1)
LYMPHS ABS: 0.8 10*3/uL (ref 0.7–3.1)
Lymphs: 14 %
MCH: 24.4 pg — ABNORMAL LOW (ref 26.6–33.0)
MCHC: 30.4 g/dL — AB (ref 31.5–35.7)
MCV: 80 fL (ref 79–97)
MONOS ABS: 0.8 10*3/uL (ref 0.1–0.9)
Monocytes: 14 %
Neutrophils Absolute: 3.9 10*3/uL (ref 1.4–7.0)
Neutrophils: 68 %
PLATELETS: 102 10*3/uL — AB (ref 150–379)
RBC: 3.2 x10E6/uL — AB (ref 4.14–5.80)
RDW: 17.3 % — AB (ref 12.3–15.4)
WBC: 5.7 10*3/uL (ref 3.4–10.8)

## 2017-11-27 LAB — URINE CULTURE: ORGANISM ID, BACTERIA: NO GROWTH

## 2017-11-29 ENCOUNTER — Encounter: Payer: Self-pay | Admitting: Radiology

## 2017-12-12 ENCOUNTER — Encounter: Payer: Self-pay | Admitting: Emergency Medicine

## 2017-12-12 ENCOUNTER — Other Ambulatory Visit: Payer: Self-pay

## 2017-12-12 ENCOUNTER — Ambulatory Visit: Payer: Self-pay | Admitting: Emergency Medicine

## 2017-12-12 VITALS — BP 138/84 | HR 77 | Temp 97.9°F | Resp 16 | Ht 66.0 in | Wt 192.8 lb

## 2017-12-12 DIAGNOSIS — D649 Anemia, unspecified: Secondary | ICD-10-CM | POA: Insufficient documentation

## 2017-12-12 DIAGNOSIS — N529 Male erectile dysfunction, unspecified: Secondary | ICD-10-CM

## 2017-12-12 LAB — POCT CBC
Granulocyte percent: 68.9 %G (ref 37–80)
HEMATOCRIT: 29 % — AB (ref 43.5–53.7)
HEMOGLOBIN: 8.8 g/dL — AB (ref 14.1–18.1)
LYMPH, POC: 1 (ref 0.6–3.4)
MCH, POC: 24.1 pg — AB (ref 27–31.2)
MCHC: 30.3 g/dL — AB (ref 31.8–35.4)
MCV: 79.5 fL — AB (ref 80–97)
MID (cbc): 0.3 (ref 0–0.9)
MPV: 6.1 fL (ref 0–99.8)
POC GRANULOCYTE: 3 (ref 2–6.9)
POC LYMPH %: 23.5 % (ref 10–50)
POC MID %: 7.6 % (ref 0–12)
Platelet Count, POC: 143 10*3/uL (ref 142–424)
RBC: 3.64 M/uL — AB (ref 4.69–6.13)
RDW, POC: 18.5 %
WBC: 4.3 10*3/uL — AB (ref 4.6–10.2)

## 2017-12-12 LAB — COMPREHENSIVE METABOLIC PANEL
A/G RATIO: 0.5 — AB (ref 1.2–2.2)
ALBUMIN: 2.9 g/dL — AB (ref 3.5–5.5)
ALT: 29 IU/L (ref 0–44)
AST: 47 IU/L — ABNORMAL HIGH (ref 0–40)
Alkaline Phosphatase: 90 IU/L (ref 39–117)
BILIRUBIN TOTAL: 0.8 mg/dL (ref 0.0–1.2)
BUN / CREAT RATIO: 18 (ref 9–20)
BUN: 10 mg/dL (ref 6–24)
CHLORIDE: 106 mmol/L (ref 96–106)
CO2: 20 mmol/L (ref 20–29)
Calcium: 8.9 mg/dL (ref 8.7–10.2)
Creatinine, Ser: 0.57 mg/dL — ABNORMAL LOW (ref 0.76–1.27)
GFR calc non Af Amer: 122 mL/min/{1.73_m2} (ref >59)
GFR, EST AFRICAN AMERICAN: 141 mL/min/{1.73_m2} (ref >59)
Globulin, Total: 5.4 g/dL — ABNORMAL HIGH (ref 1.5–4.5)
Glucose: 102 mg/dL — ABNORMAL HIGH (ref 65–99)
POTASSIUM: 4.3 mmol/L (ref 3.5–5.2)
Sodium: 137 mmol/L (ref 134–144)
TOTAL PROTEIN: 8.3 g/dL (ref 6.0–8.5)

## 2017-12-12 MED ORDER — SILDENAFIL CITRATE 100 MG PO TABS: 50.0000 mg | ORAL_TABLET | Freq: Every day | ORAL | 11 refills | Status: DC | PRN

## 2017-12-12 NOTE — Assessment & Plan Note (Signed)
Anemia improved.  CBC better than last.  Unremarkable exam.  No signs or symptoms of occult GI bleed.  Clinically much improved.  We will follow-up closely.

## 2017-12-12 NOTE — Patient Instructions (Addendum)
   IF you received an x-ray today, you will receive an invoice from [facility]. Please contact [facility] at [phone] with questions or concerns regarding your invoice.   IF you received labwork today, you will receive an invoice from [facility]. Please contact [facility] at [phone] with questions or concerns regarding your invoice.   Our billing staff will not be able to assist you with questions regarding bills from these companies.  You will be contacted with the lab results as soon as they are available. The fastest way to get your results is to activate your My Chart account. Instructions are located on the last page of this paperwork. If you have not heard from us regarding the results in 2 weeks, please contact this office.     Anemia Anemia La anemia es una afeccin en la cual no hay suficientes glbulos rojos o hemoglobina. La hemoglobina es la sustancia de los glbulos rojos que lleva el oxgeno. Cuando no hay suficientes glbulos rojos o hemoglobina (est anmico), su cuerpo no puede recibir el oxgeno suficiente, y es posible que sus rganos no funcionen correctamente. Como resultado, es posible que se sienta muy cansado o sufra otros problemas. Cules son las causas? Las causas ms frecuentes de anemia son:  Sangrado excesivo. La anemia puede ser causada por un sangrado excesivo dentro o fuera del cuerpo, incluido el sangrado del intestino o del perodo en las mujeres.  Dficit nutricional.  Enfermedad heptica, tiroidea o renal (crnicas).  Trastornos de la mdula sea.  Cncer y tratamientos para el cncer.  VIH (virus de inmunodeficiencia humana) y SIDA (sndrome de inmunodeficiencia adquirida).  Tratamientos para el VIH y el SIDA.  Problemas en el bazo.  Enfermedades de la sangre.  Infecciones, medicamentos y enfermedades autoinmunes que destruyen los glbulos rojos.  Cules son los signos o los sntomas? Los sntomas de esta  afeccin incluyen los siguientes:  Debilidad leve.  Mareos.  Dolor de cabeza.  Sensacin de latidos cardacos irregulares o ms rpidos que lo normal (palpitaciones).  Falta de aire, especialmente con el ejercicio.  Palidez.  Sensibilidad al fro.  Dispepsia.  Nuseas.  Dificultad para dormir.  Dificultad para concentrarse.  Los sntomas pueden ocurrir repentinamente o manifestarse lentamente. Si la anemia es leve, es posible que no tenga sntomas. Cmo se diagnostica? Esta afeccin se diagnostica en funcin de lo siguiente:  Anlisis de sangre.  Sus antecedentes mdicos.  Un examen fsico.  Biopsia de mdula sea.  Adems, el mdico puede controlar si hay sangre en sus heces (materia fecal) y realizar anlisis adicionales para detectar la causa del sangrado. Tambin pueden hacerle otros estudios, por ejemplo:  Pruebas de diagnstico por imgenes, como una resonancia magntica (RM) o una exploracin por tomografa computarizada (TC).  Endoscopia.  Colonoscopia.  Cmo se trata? El tratamiento de esta afeccin depende de la causa. Si contina perdiendo mucha sangre, es posible que necesite recibir tratamiento en un hospital. El tratamiento puede incluir lo siguiente:  Tomar suplementos de hierro, vitamina B12 o cido flico.  Tomar un medicamento para las hormonas (eritropoyetina) que puede ayudar a estimular el desarrollo de glbulos rojos.  Recibir una transfusin de sangre. Esta ser necesaria si pierde mucha sangre.  Realizar cambios en la dieta.  Someterse a una ciruga para extirpar el bazo.  Siga estas indicaciones en su casa:  Tome los medicamentos de venta libre y los recetados solamente como se lo haya indicado el mdico.  Tome los suplementos solamente como se lo haya indicado el   mdico.  Siga las instrucciones de la dieta que le hayan dado.  Concurra a todas las visitas de seguimiento como se lo haya indicado el mdico. Esto es  importante. Comunquese con un mdico si:  Tiene nuevos sangrados en cualquier parte del cuerpo. Solicite ayuda de inmediato si:  Se siente muy dbil.  Le falta el aire.  Siente dolor en la espalda, el abdomen o el pecho.  Se siente mareado o sufre un desmayo.  Tiene dificultad para concentrarse.  Las heces son alquitranadas, sanguinolentas o negras.  Vomita repetidamente o vomita sangre. Resumen  La anemia es una afeccin en la que no hay suficientes glbulos rojos o la cantidad suficiente de la sustancia de los glbulos rojos que transporta el oxgeno (hemoglobina).  Los sntomas pueden ocurrir repentinamente o manifestarse lentamente.  Si la anemia es leve, es posible que no tenga sntomas.  Esta afeccin se diagnostica mediante anlisis de sangre y un examen fsico, y en funcin de sus antecedentes mdicos. Pueden ser necesarios otros estudios.  El tratamiento de esta afeccin depende de la causa de la anemia. Esta informacin no tiene como fin reemplazar el consejo del mdico. Asegrese de hacerle al mdico cualquier pregunta que tenga. Document Released: 08/23/2005 Document Revised: 12/13/2016 Document Reviewed: 12/13/2016 Elsevier Interactive Patient Education  2018 Elsevier Inc.  

## 2017-12-12 NOTE — Progress Notes (Signed)
Wesley Williams 47 y.o.   Chief Complaint  Patient presents with  . Follow-up    on Labs    HISTORY OF PRESENT ILLNESS: This is a 47 y.o. male seen by me on 11/26/2017 with intractable headaches.  Headaches gone.  He feels much better.  Blood work showed several chronic findings including anemia.  Here for follow-up.  No EtOH use for 3 weeks.  Denies melena or rectal bleeding.  Denies blood in the urine.  Asymptomatic with no complaints.  HPI   Prior to Admission medications   Medication Sig Start Date End Date Taking? Authorizing Provider  amLODipine (NORVASC) 2.5 MG tablet Take 1 tablet (2.5 mg total) by mouth daily. Patient not taking: Reported on 11/26/2017 06/18/16   Trixie Dredge, PA-C  lidocaine (LIDODERM) 5 % Place 1 patch onto the skin daily. Remove & Discard patch within 12 hours or as directed by MD Patient not taking: Reported on 11/26/2017 06/18/16   Trixie Dredge, PA-C  methocarbamol (ROBAXIN) 500 MG tablet Take 1-2 tablets (500-1,000 mg total) by mouth every 6 (six) hours as needed for muscle spasms (and pain). Patient not taking: Reported on 11/26/2017 06/18/16   Trixie Dredge, PA-C  metoprolol succinate (TOPROL XL) 25 MG 24 hr tablet Take 1 tablet (25 mg total) by mouth daily. Patient not taking: Reported on 11/26/2017 06/18/16   Trixie Dredge, PA-C  tamsulosin (FLOMAX) 0.4 MG CAPS capsule Take 1 capsule (0.4 mg total) by mouth daily. Patient not taking: Reported on 11/26/2017 06/18/16   Trixie Dredge, PA-C    No Known Allergies  Patient Active Problem List   Diagnosis Date Noted  . Nonintractable headache 11/26/2017  . Urinary pain 11/26/2017  . Alcohol withdrawal (HCC) 04/25/2016  . AKI (acute kidney injury) (HCC) 04/23/2016  . Dehydration 04/23/2016  . Rhabdomyolysis 04/23/2016  . Syncope and collapse 04/23/2016  . Alcohol abuse 04/23/2016    Past Medical History:  Diagnosis Date  . Alcohol abuse   . Hypertension     No past surgical history on file.  Social  History   Socioeconomic History  . Marital status: Single    Spouse name: Not on file  . Number of children: Not on file  . Years of education: Not on file  . Highest education level: Not on file  Occupational History  . Not on file  Social Needs  . Financial resource strain: Not on file  . Food insecurity:    Worry: Not on file    Inability: Not on file  . Transportation needs:    Medical: Not on file    Non-medical: Not on file  Tobacco Use  . Smoking status: Never Smoker  . Smokeless tobacco: Never Used  Substance and Sexual Activity  . Alcohol use: No    Alcohol/week: 7.2 oz    Types: 12 Cans of beer per week    Comment: daily 12 pk  . Drug use: No  . Sexual activity: Yes  Lifestyle  . Physical activity:    Days per week: Not on file    Minutes per session: Not on file  . Stress: Not on file  Relationships  . Social connections:    Talks on phone: Not on file    Gets together: Not on file    Attends religious service: Not on file    Active member of club or organization: Not on file    Attends meetings of clubs or organizations: Not on file    Relationship status:  Not on file  . Intimate partner violence:    Fear of current or ex partner: Not on file    Emotionally abused: Not on file    Physically abused: Not on file    Forced sexual activity: Not on file  Other Topics Concern  . Not on file  Social History Narrative  . Not on file    No family history on file.   Review of Systems  Constitutional: Negative.  Negative for chills, fever and weight loss.  HENT: Negative.  Negative for sore throat.   Eyes: Negative.  Negative for blurred vision and double vision.  Respiratory: Negative.  Negative for cough and shortness of breath.   Cardiovascular: Negative.  Negative for chest pain and palpitations.  Gastrointestinal: Negative for abdominal pain, blood in stool, constipation, diarrhea, melena, nausea and vomiting.  Genitourinary: Negative.  Negative for  dysuria and hematuria.       +ED  Musculoskeletal: Negative.  Negative for back pain, myalgias and neck pain.  Skin: Negative.  Negative for rash.  Neurological: Negative.  Negative for dizziness and headaches.  Endo/Heme/Allergies: Negative.   All other systems reviewed and are negative.  Vitals:   12/12/17 1105  BP: 138/84  Pulse: 77  Resp: 16  Temp: 97.9 F (36.6 C)  SpO2: 100%     Physical Exam  Constitutional: He is oriented to person, place, and time. He appears well-developed and well-nourished.  HENT:  Head: Normocephalic and atraumatic.  Nose: Nose normal.  Mouth/Throat: Oropharynx is clear and moist.  Eyes: Pupils are equal, round, and reactive to light. Conjunctivae and EOM are normal.  Neck: Normal range of motion. Neck supple.  Cardiovascular: Normal rate, regular rhythm, normal heart sounds and intact distal pulses.  Pulmonary/Chest: Effort normal and breath sounds normal.  Abdominal: Soft. Bowel sounds are normal. He exhibits no distension and no mass. There is no tenderness. There is no rebound and no guarding.  Musculoskeletal: Normal range of motion. He exhibits no edema or tenderness.  Lymphadenopathy:    He has no cervical adenopathy.  Neurological: He is alert and oriented to person, place, and time. No sensory deficit. He exhibits normal muscle tone. Coordination normal.  Skin: Skin is warm and dry. Capillary refill takes less than 2 seconds. No rash noted.  Psychiatric: He has a normal mood and affect. His behavior is normal.  Vitals reviewed.  Results for orders placed or performed in visit on 12/12/17 (from the past 24 hour(s))  POCT CBC     Status: Abnormal   Collection Time: 12/12/17 11:51 AM  Result Value Ref Range   WBC 4.3 (A) 4.6 - 10.2 K/uL   Lymph, poc 1.0 0.6 - 3.4   POC LYMPH PERCENT 23.5 10 - 50 %L   MID (cbc) 0.3 0 - 0.9   POC MID % 7.6 0 - 12 %M   POC Granulocyte 3.0 2 - 6.9   Granulocyte percent 68.9 37 - 80 %G   RBC 3.64 (A)  4.69 - 6.13 M/uL   Hemoglobin 8.8 (A) 14.1 - 18.1 g/dL   HCT, POC 21.3 (A) 08.6 - 53.7 %   MCV 79.5 (A) 80 - 97 fL   MCH, POC 24.1 (A) 27 - 31.2 pg   MCHC 30.3 (A) 31.8 - 35.4 g/dL   RDW, POC 57.8 %   Platelet Count, POC 143 142 - 424 K/uL   MPV 6.1 0 - 99.8 fL    Anemia Anemia improved.  CBC better than  last.  Unremarkable exam.  No signs or symptoms of occult GI bleed.  Clinically much improved.  We will follow-up closely.   ASSESSMENT & PLAN:  [name] was seen today for follow-up.  Diagnoses and all orders for this visit:  Anemia, unspecified type -     Orthostatic vital signs -     POCT CBC -     Comprehensive metabolic panel  Erectile dysfunction, unspecified erectile dysfunction type -     sildenafil (VIAGRA) 100 MG tablet; Take 0.5-1 tablets (50-100 mg total) by mouth daily as needed for erectile dysfunction.    Patient Instructions       IF you received an x-ray today, you will [facility] an [facility]. Please contact [facility] at [phone] with questions or concerns regarding your invoice.   IF you received labwork today, you will receive an invoice from [facility]. Please contact [facility] at [phone] with questions or concerns regarding your invoice.   Our billing staff will not be able to assist you with questions regarding bills from these companies.  You will be contacted with the lab results as soon as they are available. The fastest way to get your results is to activate your My Chart account. Instructions are located on the last page of this paperwork. If you have not heard from us regarding the results in 2 weeks, please contact this office.     Anemia Anemia La anemia es una afeccin en la cual no hay suficientes glbulos rojos o hemoglobina. La hemoglobina es la sustancia de los glbulos rojos que lleva el oxgeno. Cuando no hay suficientes glbulos rojos o hemoglobina (est anmico), su cuerpo no puede recibir el  oxgeno suficiente, y es posible que sus rganos no funcionen correctamente. Como [place]resultado, es posible que se sienta muy cansado o sufra otros problemas. Cules son las causas? Las causas ms frecuentes de anemia son:  Sharlyne PacasSangrado excesivo. La anemia puede ser causada por un sangrado excesivo dentro o fuera del cuerpo, incluido el sangrado del intestino o del perodo en las mujeres.  Dficit nutricional.  Enfermedad heptica, tiroidea o renal (crnicas).  Trastornos de la mdula sea.  Cncer y tratamientos para Management consultantel cncer.  VIH (virus de inmunodeficiencia [place]) y SIDA (sndrome de inmunodeficiencia adquirida).  Tratamientos para el VIH y [place] SIDA.  Problemas en el bazo.  Enfermedades de Clear Channel Communicationsla sangre.  Infecciones, medicamentos y enfermedades autoinmunes que American Electric Powerdestruyen los glbulos rojos.  Cules son los signos o los sntomas? Los sntomas de esta afeccin incluyen los siguientes:  Debilidad leve.  Mareos.  Dolor de Turkmenistancabeza.  Sensacin de latidos cardacos irregulares o ms rpidos que lo normal (palpitaciones).  Falta de aire, especialmente con el ejercicio.  Palidez.  Sensibilidad al fro.  Dispepsia.  Nuseas.  Dificultad para dormir.  Dificultad para concentrarse.  Los sntomas pueden ocurrir repentinamente o Audiological scientistmanifestarse lentamente. Si la anemia es leve, es posible que no tenga sntomas. Cmo se diagnostica? Esta afeccin se diagnostica en funcin de lo siguiente:  Anlisis de sangre.  Sus antecedentes mdicos.  Un examen fsico.  Biopsia de mdula sea.  Adems, el mdico puede controlar si hay sangre en sus heces (materia fecal) y Education officer, environmentalrealizar anlisis adicionales para Engineer, manufacturingdetectar la causa del sangrado. Tambin pueden hacerle otros estudios, por ejemplo:  Pruebas de diagnstico por imgenes, como una resonancia magntica (RM) o una exploracin por tomografa computarizada (TC).  Endoscopia.  Colonoscopia.  Cmo se trata? El tratamiento de esta  afeccin depende de la causa. Si contina perdiendo  mucha sangre, es posible que necesite recibir tratamiento en un hospital. El tratamiento puede incluir lo siguiente:  Tomar suplementos de hierro, vitamina B12 o cido flico.  Tomar un medicamento para las hormonas (eritropoyetina) que puede ayudar a Radio producer de glbulos rojos.  Recibir una transfusin de [place]. Esta ser necesaria si pierde [employer].  Realizar cambios en la dieta.  Someterse a [place] para Public house manager.  Siga estas indicaciones en su casa:  Tome los medicamentos de venta libre y los recetados solamente como se lo haya indicado el mdico.  Tome los suplementos solamente como se lo haya indicado el mdico.  Siga las instrucciones de la dieta que le hayan dado.  Concurra a todas las visitas de [address] se lo haya indicado el mdico. Esto es importante. Comunquese con un mdico si:  Tiene nuevos sangrados en cualquier parte del cuerpo. Solicite ayuda de inmediato si:  Se siente muy dbil.  Le falta el aire.  Siente dolor en la espalda, el abdomen o el pecho.  Se siente mareado o sufre un desmayo.  Tiene dificultad para concentrarse.  Las heces son alquitranadas, sanguinolentas o negras.  Vomita repetidamente o vomita sangre. Resumen  La anemia es una afeccin en la que no hay suficientes glbulos rojos o la cantidad suficiente de la sustancia de los glbulos rojos que transporta el oxgeno (hemoglobina).  Los sntomas pueden ocurrir repentinamente o Audiological scientist.  Si la anemia es leve, es posible que no tenga sntomas.  Esta afeccin se diagnostica mediante anlisis de sangre y un examen fsico, y en funcin de sus antecedentes mdicos. Pueden ser necesarios otros estudios.  El tratamiento de esta afeccin depende de la causa de la anemia. Esta informacin no tiene Theme park manager el consejo del mdico. Asegrese de hacerle al mdico cualquier  pregunta que tenga. Document Released: 08/23/2005 Document Revised: 12/13/2016 Document Reviewed: 12/13/2016 Elsevier Interactive Patient Education  2018 Elsevier Inc.     Edwina Barth, MD [facility]

## 2017-12-13 ENCOUNTER — Encounter: Payer: Self-pay | Admitting: Radiology

## 2017-12-31 ENCOUNTER — Encounter: Payer: Self-pay | Admitting: Emergency Medicine

## 2017-12-31 ENCOUNTER — Ambulatory Visit: Payer: Self-pay | Admitting: Emergency Medicine

## 2017-12-31 VITALS — BP 120/74 | HR 77 | Temp 98.8°F | Resp 16 | Ht 66.5 in | Wt 194.2 lb

## 2017-12-31 DIAGNOSIS — D649 Anemia, unspecified: Secondary | ICD-10-CM

## 2017-12-31 NOTE — Patient Instructions (Addendum)
   IF you received an x-ray today, you will receive an invoice from [facility]. Please contact [facility] at [phone] with questions or concerns regarding your invoice.   IF you received labwork today, you will receive an invoice from [facility]. Please contact [facility] at [phone] with questions or concerns regarding your invoice.   Our billing staff will not be able to assist you with questions regarding bills from these companies.  You will be contacted with the lab results as soon as they are available. The fastest way to get your results is to activate your My Chart account. Instructions are located on the last page of this paperwork. If you have not heard from us regarding the results in 2 weeks, please contact this office.      Mantenimiento de la salud en los hombres (Health Maintenance, Male) Un estilo de vida saludable y los cuidados preventivos son importantes para la salud y el bienestar. Pregntele al mdico cul es el cronograma de exmenes peridicos adecuado para usted. QU DEBO SABER SOBRE EL PESO Y LA DIETA? Consuma una dieta saludable  Coma muchas verduras, frutas, cereales integrales, productos lcteos con bajo contenido de grasa y protenas magras.  No consuma muchos alimentos de alto contenido de grasas slidas, azcares agregados o sal. Mantenga un peso saludable La actividad fsica habitual puede ayudarlo a alcanzar o mantener un peso saludable. Deber hacer lo siguiente:  Realizar al menos 150minutos de actividad fsica por semana. El ejercicio debe aumentar la frecuencia cardaca y provocar la transpiracin (ejercicio de intensidad moderada).  Hacer ejercicios de entrenamiento de fuerza por lo menos dos veces por semana. Controlarse los niveles de colesterol y lpidos en la sangre  Hgase anlisis de sangre para controlar los lpidos y el colesterol cada 5aos a partir de los 35aos. Si tiene un riesgo alto de tener cardiopatas  coronarias, debe comenzar a hacerse anlisis de sangre a los 20aos. Es posible que necesite controlar los niveles de colesterol con mayor frecuencia si: ? Sus niveles de lpidos y colesterol son altos. ? Es mayor de 50aos. ? Tiene un riesgo alto de tener cardiopatas coronarias. QU DEBO SABER SOBRE LAS PRUEBAS DE DETECCIN DEL CNCER? Muchos tipos de cncer se pueden detectar de manera temprana y a menudo prevenirse. Cncer de pulmn  Debe someterse a pruebas de deteccin de cncer de pulmn todos los aos en los siguientes casos: ? Si fuma actualmente y lo ha hecho durante por lo menos 30aos. ? Si fue fumador que dej el hbito en el trmino de los ltimos 15aos.  Hable con el mdico sobre las opciones en relacin con los estudios de deteccin, cundo debe comenzar a hacrselos y con qu frecuencia. Cncer colorrectal  Generalmente, las pruebas de deteccin habituales del cncer colorrectal comienzan a los 50aos y deben repetirse cada 5 a 10aos hasta los 75aos. Es posible que tenga que hacerse las pruebas con mayor frecuencia si se detectan formas tempranas de plipos precancerosos o pequeos bultos. Sin embargo, el mdico podr aconsejarle que lo haga antes, si tiene factores de riesgo para el cncer de colon.  El mdico puede recomendarle que use kits de prueba caseros para hallar sangre oculta en la materia fecal.  Se puede usar una pequea cmara en el extremo de un tubo para examinar el colon (sigmoidoscopia o colonoscopia). Este estudio detecta las formas ms tempranas de cncer colorrectal. Cncer de prstata y de testculo  En funcin de la edad y del estado de salud general, el mdico   puede realizarle determinados estudios de deteccin del cncer de prstata y de testculo.  Hable con el mdico sobre cualquier sntoma o acerca de las inquietudes que tenga sobre el cncer de prstata o de testculo. Cncer de piel  Revise la piel de la cabeza a los pies con  regularidad.  Informe al mdico si aparecen nuevos lunares o si nota cambios en los que ya tiene, especialmente en estos casos: ? Si hay un cambio en el tamao, la forma o el color del lunar. ? Si tiene un lunar que es ms grande que el tamao de una goma de lpiz.  Siempre use pantalla solar. Aplquese pantalla solar de manera generosa y repetida a lo largo del da.  Use mangas y pantalones largos, un sombrero de ala ancha y gafas para el sol cuando est al aire libre, para protegerse. QU DEBO SABER SOBRE LAS CARDIOPATAS CORONARIAS, LA DIABETES Y LA HIPERTENSIN ARTERIAL?  Si usted tiene entre 18 y 39aos, debe medirse la presin arterial cada 3a 5aos. Si usted tiene 40aos o ms, debe medirse la presin arterial todos los aos. Debe medirse la presin arterial dos veces: una vez cuando est en un hospital o una clnica y la otra vez cuando est en otro sitio. Registre el promedio de las dos mediciones. Para controlar su presin arterial cuando no est en un hospital o una clnica, puede usar lo siguiente: ? Una mquina automtica para medir la presin arterial en una farmacia. ? Un monitor para medir la presin arterial en el hogar.  Hable con el mdico sobre los valores ideales de la presin arterial.  Si tiene entre 45 y 79aos, consltele al mdico si debe tomar aspirina para evitar las cardiopatas coronarias.  Hgase anlisis habituales de deteccin de la diabetes; para ello, contrlese la glucemia en ayunas. ? Si su peso es normal y tiene un bajo riesgo de padecer diabetes, realcese este anlisis cada tres aos despus de los 45aos. ? Si tiene sobrepeso y un alto riesgo de padecer diabetes, considere someterse a este anlisis antes o con mayor frecuencia.  Para los hombres que tienen entre 65 y 75aos, y son o han sido fumadores, se recomienda un nico estudio con ecografa para detectar un aneurisma de aorta abdominal (AAA). QU DEBO SABER SOBRE LA PREVENCIN DE LAS  INFECCIONES? HepatitisB Si tiene un riesgo ms alto de contraer hepatitis B, debe someterse a un examen de deteccin de este virus. Hable con el mdico para determinar si corre riesgo de tener una infeccin por hepatitisB. Hepatitis C Se recomienda un anlisis de sangre para:  Todos los que nacieron entre 1945 y 1965.  Todas las personas que tengan un riesgo de haber contrado hepatitis C. Enfermedades de transmisin sexual (ETS)  Debe realizarse pruebas de deteccin de las ETS todos los aos, incluidas la gonorrea y la clamidia, en estos casos: ? Es sexualmente activo y es menor de 24aos. ? Es mayor de 24aos, y el mdico le informa que corre riesgo de tener este tipo de infecciones. ? La actividad sexual ha cambiado desde que le hicieron la ltima prueba de deteccin y tiene un riesgo mayor de tener clamidia o gonorrea. Pregntele al mdico si usted tiene riesgo.  Consulte a su mdico para saber si tiene un alto riesgo de infectarse por el VIH. El mdico puede recomendarle un medicamento de venta con receta para ayudar a evitar la infeccin por el VIH. QU MS PUEDO HACER?  Realcese los estudios de rutina de la salud, dentales   y de la vista.  Mantngase al da con las vacunas (inmunizaciones).  No consuma ningn producto que contenga tabaco, lo que incluye cigarrillos, tabaco de mascar y cigarrillos electrnicos. Si necesita ayuda para dejar de fumar, consulte al mdico.  Limite el consumo de alcohol a no ms de 2medidas por da. Una medida equivale a 12 onzas de cerveza, 5onzas de vino o 1onzas de bebidas alcohlicas de alta graduacin.  No consuma drogas.  No comparta agujas.  Solicite ayuda a su mdico si necesita apoyo o informacin para abandonar las drogas.  Informe a su mdico si a menudo se siente deprimido.  Notifique a su mdico si alguna vez ha sido vctima de abuso o si no se siente seguro en su hogar. Esta informacin no tiene como fin reemplazar el  consejo del mdico. Asegrese de hacerle al mdico cualquier pregunta que tenga. Document Released: 02/19/2008 Document Revised: 09/13/2014 Document Reviewed: 05/27/2015 Elsevier Interactive Patient Education  2018 Elsevier Inc.  

## 2017-12-31 NOTE — Progress Notes (Signed)
Wesley Williams 47 y.o.   Chief Complaint  Patient presents with  . Follow-up    Anemia,  pt states "He feels a whole lot better"    HISTORY OF PRESENT ILLNESS: This is a 47 y.o. male here for follow-up of anemia.  Used to be a chronic EtOH user.  Has been sober for the past 60 days.  Feels a lot better.  Has no complaints today.  HPI   Prior to Admission medications   Medication Sig Start Date End Date Taking? Authorizing Provider  amLODipine (NORVASC) 2.5 MG tablet Take 1 tablet (2.5 mg total) by mouth daily. Patient not taking: Reported on 11/26/2017 06/18/16   Trixie Dredge, PA-C  lidocaine (LIDODERM) 5 % Place 1 patch onto the skin daily. Remove & Discard patch within 12 hours or as directed by MD Patient not taking: Reported on 11/26/2017 06/18/16   Trixie Dredge, PA-C  methocarbamol (ROBAXIN) 500 MG tablet Take 1-2 tablets (500-1,000 mg total) by mouth every 6 (six) hours as needed for muscle spasms (and pain). Patient not taking: Reported on 11/26/2017 06/18/16   Trixie Dredge, PA-C  metoprolol succinate (TOPROL XL) 25 MG 24 hr tablet Take 1 tablet (25 mg total) by mouth daily. Patient not taking: Reported on 11/26/2017 06/18/16   Trixie Dredge, PA-C  sildenafil (VIAGRA) 100 MG tablet Take 0.5-1 tablets (50-100 mg total) by mouth daily as needed for erectile dysfunction. Patient not taking: Reported on 12/31/2017 12/12/17   Georgina Quint, MD  tamsulosin (FLOMAX) 0.4 MG CAPS capsule Take 1 capsule (0.4 mg total) by mouth daily. Patient not taking: Reported on 11/26/2017 06/18/16   Trixie Dredge, PA-C    No Known Allergies  Patient Active Problem List   Diagnosis Date Noted  . Anemia 12/12/2017  . Erectile dysfunction 12/12/2017  . Nonintractable headache 11/26/2017  . Urinary pain 11/26/2017  . Alcohol withdrawal (HCC) 04/25/2016  . AKI (acute kidney injury) (HCC) 04/23/2016  . Dehydration 04/23/2016  . Rhabdomyolysis 04/23/2016  . Syncope and collapse 04/23/2016  . Alcohol  abuse 04/23/2016    Past Medical History:  Diagnosis Date  . Alcohol abuse   . Hypertension     No past surgical history on file.  Social History   Socioeconomic History  . Marital status: Single    Spouse name: Not on file  . Number of children: Not on file  . Years of education: Not on file  . Highest education level: Not on file  Occupational History  . Not on file  Social Needs  . Financial resource strain: Not on file  . Food insecurity:    Worry: Not on file    Inability: Not on file  . Transportation needs:    Medical: Not on file    Non-medical: Not on file  Tobacco Use  . Smoking status: Never Smoker  . Smokeless tobacco: Never Used  Substance and Sexual Activity  . Alcohol use: No    Alcohol/week: 7.2 oz    Types: 12 Cans of beer per week    Comment: daily 12 pk  . Drug use: No  . Sexual activity: Yes  Lifestyle  . Physical activity:    Days per week: Not on file    Minutes per session: Not on file  . Stress: Not on file  Relationships  . Social connections:    Talks on phone: Not on file    Gets together: Not on file    Attends religious service: Not on file  Active member of club or organization: Not on file    Attends meetings of clubs or organizations: Not on file    Relationship status: Not on file  . Intimate partner violence:    Fear of current or ex partner: Not on file    Emotionally abused: Not on file    Physically abused: Not on file    Forced sexual activity: Not on file  Other Topics Concern  . Not on file  Social History Narrative  . Not on file    No family history on file.   Review of Systems  Constitutional: Negative for chills, fever and weight loss.  HENT: Negative for sore throat.   Eyes: Negative for blurred vision and double vision.  Cardiovascular: Negative for chest pain and palpitations.  Gastrointestinal: Negative for abdominal pain, blood in stool, diarrhea, melena, nausea and vomiting.  Genitourinary:  Negative.  Negative for dysuria and hematuria.  Musculoskeletal: Negative for back pain, myalgias and neck pain.  Skin: Negative for rash.  Neurological: Negative.  Negative for dizziness and headaches.  Endo/Heme/Allergies: Negative.   All other systems reviewed and are negative.   Vitals:   12/31/17 1115  BP: 120/74  Pulse: 77  Resp: 16  Temp: 98.8 F (37.1 C)  SpO2: 100%    Physical Exam  Constitutional: He is oriented to person, place, and time. He appears well-developed and well-nourished.  HENT:  Head: Normocephalic and atraumatic.  Eyes: Pupils are equal, round, and reactive to light. Conjunctivae and EOM are normal.  Neck: Normal range of motion. Neck supple.  Cardiovascular: Normal rate and regular rhythm.  Pulmonary/Chest: Effort normal and breath sounds normal.  Abdominal: Soft. Bowel sounds are normal. He exhibits no distension. There is no tenderness.  Musculoskeletal: Normal range of motion.  Neurological: He is alert and oriented to person, place, and time. No sensory deficit. He exhibits normal muscle tone.  Skin: Skin is warm and dry. Capillary refill takes less than 2 seconds. No rash noted.  Psychiatric: He has a normal mood and affect. His behavior is normal.  Vitals reviewed.    ASSESSMENT & PLAN: Stable and doing well.  Asymptomatic.  No drinking.  Floyd was seen today for follow-up.  Diagnoses and all orders for this visit:  Anemia, unspecified type   Follow-up in 3 months Patient Instructions       IF you received an x-ray today, you will receive an invoice from [facility] Rad[facility]. Please contact [facility] at [phone] with questions or concerns regarding your invoice.   IF you received labwork today, you will receive an invoice from [place]. Please contact [facility] at [phone] with questions or concerns regarding your invoice.   Our billing staff will not be able to assist you with questions regarding bills from  these companies.  You will be contacted with the lab results as soon as they are available. The fastest way to get your results is to activate your My Chart account. Instructions are located on the last page of this paperwork. If you have not heard from [place] regarding the results in 2 weeks, please contact this office.     Mantenimiento de Engineer, civil (consulting) hombres (Health Maintenance, Male) Un estilo de vida saludable y los cuidados preventivos son importantes para la salud y Counsellor. Pregntele al mdico cul es el cronograma de exmenes peridicos adecuado para usted. QU DEBO SABER SOBRE EL PESO Y LA DIETA? Consuma una dieta saludable  Coma muchas verduras, frutas, cereales integrales,  productos lcteos con bajo contenido de [place] y protenas magras.  No consuma muchos alimentos de alto contenido de grasas slidas, azcares agregados o sal. Mantenga un peso saludable La actividad fsica habitual puede ayudarlo a Barista o mantener un peso saludable. Deber hacer lo siguiente:  Realizar al menos de actividad fsica por semana. El ejercicio debe aumentar la frecuencia cardaca y Development worker, international aid la transpiracin (ejercicio de [place]).  Hacer ejercicios de entrenamiento de fuerza por lo [employer] por semana. Controlarse los niveles de colesterol y lpidos en la sangre  Hgase anlisis de sangre para controlar los lpidos y el colesterol cada 5aos a partir de los 35aos. Si tiene un riesgo alto de Warehouse manager cardiopatas coronarias, debe comenzar a [employer] de [place] a los [place]. Es posible que Insurance underwriter los niveles de colesterol con mayor frecuencia si: ? Sus niveles de lpidos y colesterol son altos. ? Es mayor de 56OZH. ? Tiene un riesgo alto de tener cardiopatas coronarias. QU DEBO SABER SOBRE LAS PRUEBAS DE DETECCIN DEL CNCER? Muchos tipos de cncer se pueden detectar de manera temprana y a menudo prevenirse. Cncer de pulmn  Debe  someterse a pruebas de deteccin de cncer de pulmn todos los aos en los siguientes casos: ? Si fuma actualmente y lo ha hecho durante por lo menos 30aos. ? Si fue fumador que dej el hbito en el trmino de los ltimos 15aos.  Hable con el mdico sobre las opciones en relacin con los estudios de deteccin, cundo debe comenzar a Actuary y con Engineer, structural. Cncer colorrectal  Generalmente, las pruebas de deteccin habituales del cncer colorrectal comienzan a los 50aos y deben repetirse cada 5 a 10aos hasta los 75aos. Es posible que tenga que hacerse las pruebas con mayor frecuencia si se detectan formas tempranas de plipos precancerosos o pequeos bultos. Sin embargo, el mdico podr aconsejarle que lo haga antes, si tiene factores de riesgo para el cncer de colon.  El mdico puede recomendarle que use kits de prueba caseros para Recruitment consultant oculta en la materia fecal.  Se puede usar una pequea cmara en el extremo de un tubo para examinar el colon (sigmoidoscopia o colonoscopia). Este estudio [employer] formas ms tempranas de Building services engineer. Cncer de prstata y de testculo  En funcin de la edad y del [place] de salud general, el mdico puede realizarle determinados estudios de deteccin del cncer de prstata y de testculo.  Hable con el mdico sobre cualquier sntoma o acerca de las inquietudes que tenga sobre el cncer de prstata o de testculo. Cncer de piel  Revise la piel de la cabeza a los pies con regularidad.  Informe al mdico si aparecen nuevos lunares o si nota cambios en los que ya tiene, especialmente en estos casos: ? Si hay un cambio en el tamao, la forma o el color del lunar. ? Si tiene un lunar que es ms grande que el tamao de una goma de Paramedic.  Siempre use pantalla solar. Aplquese pantalla solar de [place] y repetida a lo largo del Futures trader.  Use mangas y Automatic Data, un sombrero de ala ancha y gafas para el sol cuando  est al [place] Dawn, para protegerse. QU DEBO SABER SOBRE LAS CARDIOPATAS CORONARIAS, LA DIABETES Y LA HIPERTENSIN ARTERIAL?  Si usted tiene entre 18 y 39aos, debe medirse la presin arterial cada 3a 5aos. Si usted tiene 40aos o ms, debe medirse la presin arterial [employer]. Debe medirse la presin arterial dos veces:  una vez cuando est en un hospital o una clnica y la otra vez cuando est en otro sitio. Registre el promedio de [employer]. Para controlar su presin arterial cuando no est en un hospital o Paulita Cradle, puede usar lo siguiente: ? Valere Dross automtica para medir la presin arterial en una farmacia. ? Un monitor para medir la presin arterial en el hogar.  Hable con el mdico [employer] ideales de la presin arterial.  Si tiene entre 45 y 79aos, consltele al mdico si debe tomar aspirina para evitar las cardiopatas coronarias.  Hgase anlisis habituales de deteccin de la diabetes; para ello, contrlese la glucemia en ayunas. ? Si su peso es normal y tiene un bajo riesgo de padecer diabetes, realcese este anlisis cada tres aos despus de los 45aos. ? Si tiene sobrepeso y un alto riesgo de padecer diabetes, considere someterse a este anlisis antes o con mayor frecuencia.  Para los hombres que tienen entre 65 y 43aos, y son o han sido fumadores, se recomienda un nico estudio con ecografa para Engineer, manufacturing un aneurisma de aorta abdominal (AAA). QU DEBO SABER SOBRE LA PREVENCIN DE LAS INFECCIONES? HepatitisB Si tiene un riesgo ms alto de Primary school teacher hepatitis B, debe someterse a un examen de deteccin de este virus. Hable con el mdico para determinar si corre riesgo de tener una infeccin por hepatitisB. Hepatitis C Se recomienda un anlisis de [place] para:  Todos los que nacieron entre 1946 y [phone].  Todas las personas que tengan un riesgo de haber contrado hepatitis C. Enfermedades de transmisin sexual (ETS)  Debe realizarse  pruebas de Airline pilot de las ETS todos los aos, incluidas la gonorrea y la clamidia, en estos casos: ? Es sexualmente activo y es menor de [place]. ? Es mayor de 24aos, y Public affairs consultant informa que corre riesgo de tener este tipo de infecciones. ? La actividad sexual ha cambiado desde que le hicieron la ltima prueba de deteccin y tiene un riesgo mayor de Warehouse manager clamidia o Copy. Pregntele al mdico si usted tiene riesgo.  Consulte a su mdico para saber si tiene un alto riesgo de infectarse por el VIH. El mdico puede recomendarle un medicamento de venta con receta para ayudar a evitar la infeccin por el VIH. QU MS PUEDO HACER?  Realcese los estudios de rutina de la salud, dentales y de Wellsite geologist.  Mantngase al da con las vacunas (inmunizaciones).  No consuma ningn producto que contenga tabaco, lo que incluye cigarrillos, tabaco de Theatre manager y Administrator, Civil Service. Si necesita ayuda para dejar de fumar, consulte al mdico.  Limite el consumo de alcohol a no ms de por da. [employer] a 12 onzas de cerveza, 5onzas de vino o 1onzas de bebidas alcohlicas de alta graduacin.  No consuma drogas.  No comparta agujas.  Solicite ayuda a su mdico si necesita apoyo o informacin para abandonar las drogas.  Informe a su mdico si a menudo se siente deprimido.  Notifique a su mdico si alguna vez ha sido vctima de abuso o si no se siente seguro en su hogar. Esta informacin no tiene Theme park manager el consejo del mdico. Asegrese de hacerle al mdico cualquier pregunta que tenga. Document Released: 02/19/2008 Document Revised: 09/13/2014 Document Reviewed: 05/27/2015 Elsevier Interactive Patient Education  2018 Elsevier Inc.      Edwina Barth, MD [facility]

## 2018-01-17 ENCOUNTER — Other Ambulatory Visit: Payer: Self-pay | Admitting: Emergency Medicine

## 2018-01-17 ENCOUNTER — Telehealth: Payer: Self-pay | Admitting: Emergency Medicine

## 2018-01-17 MED ORDER — TADALAFIL 20 MG PO TABS: 10.0000 mg | ORAL_TABLET | ORAL | 11 refills | Status: DC | PRN

## 2018-01-17 NOTE — Telephone Encounter (Signed)
Spoke to patient about his personal medical issues. Thanks.

## 2018-01-17 NOTE — Telephone Encounter (Signed)
Copied from CRM [id number]. Topic: Quick Communication - See Telephone Encounter >> Jan 17, 2018  3:53 PM Diana Eves B wrote: CRM for notification. See Telephone encounter for: 01/17/18.  Pt would like Dr. Alvy Bimler to call him about the medication he put him on. He would like to talk to him personally. CB# [phone]

## 2018-01-17 NOTE — Telephone Encounter (Signed)
Spoke to patient about ED medications.

## 2018-02-22 ENCOUNTER — Telehealth: Payer: Self-pay | Admitting: Emergency Medicine

## 2018-02-22 NOTE — Telephone Encounter (Signed)
Copied from CRM [id number]. Topic: Inquiry >> Feb 22, 2018 11:51 AM Stephannie LiSimmons, Shantice Menger L, NT wrote: Reason for CRM: Patient would like a return call from Dr Terence LuxSargardia himself and will not say what it is reference to please call him back at [phone]

## 2018-02-24 NOTE — Telephone Encounter (Signed)
Message sent to Dr. Sagardia 

## 2018-02-25 NOTE — Telephone Encounter (Signed)
Spoke to patient today regarding his personal concerns. Has appointment to see me next month.

## 2018-03-24 ENCOUNTER — Ambulatory Visit: Payer: Self-pay | Admitting: Emergency Medicine

## 2018-04-15 ENCOUNTER — Ambulatory Visit: Payer: Self-pay | Admitting: Emergency Medicine

## 2018-07-14 ENCOUNTER — Telehealth: Payer: Self-pay | Admitting: Emergency Medicine

## 2018-07-14 NOTE — Telephone Encounter (Signed)
Copied from CRM [id number]. Topic: General - Inquiry >> Jul 14, 2018  1:24 PM Windy Kalata, NT wrote: Reason for CRM: patient is requesting Dr. Alvy Bimler to give him a call back. Patient would not disclose any further information with me.

## 2018-07-15 NOTE — Telephone Encounter (Signed)
Message to Dr. Alvy Bimler pt would not give any info

## 2018-07-17 NOTE — Telephone Encounter (Signed)
Thanks

## 2020-02-12 ENCOUNTER — Emergency Department (HOSPITAL_COMMUNITY): Payer: Self-pay

## 2020-02-12 ENCOUNTER — Emergency Department (HOSPITAL_COMMUNITY)
Admission: EM | Admit: 2020-02-12 | Discharge: 2020-02-12 | Disposition: A | Discharge status: Home / Self Care | Payer: Self-pay | Attending: Emergency Medicine | Admitting: Emergency Medicine

## 2020-02-12 ENCOUNTER — Encounter (HOSPITAL_COMMUNITY): Payer: Self-pay | Admitting: Emergency Medicine

## 2020-02-12 ENCOUNTER — Other Ambulatory Visit: Payer: Self-pay

## 2020-02-12 DIAGNOSIS — Y998 Other external cause status: Secondary | ICD-10-CM | POA: Insufficient documentation

## 2020-02-12 DIAGNOSIS — Z79899 Other long term (current) drug therapy: Secondary | ICD-10-CM | POA: Insufficient documentation

## 2020-02-12 DIAGNOSIS — Y9389 Activity, other specified: Secondary | ICD-10-CM | POA: Insufficient documentation

## 2020-02-12 DIAGNOSIS — Y9289 Other specified places as the place of occurrence of the external cause: Secondary | ICD-10-CM | POA: Insufficient documentation

## 2020-02-12 DIAGNOSIS — W293XXA Contact with powered garden and outdoor hand tools and machinery, initial encounter: Secondary | ICD-10-CM | POA: Insufficient documentation

## 2020-02-12 DIAGNOSIS — Z23 Encounter for immunization: Secondary | ICD-10-CM | POA: Insufficient documentation

## 2020-02-12 DIAGNOSIS — I10 Essential (primary) hypertension: Secondary | ICD-10-CM | POA: Insufficient documentation

## 2020-02-12 DIAGNOSIS — S81011A Laceration without foreign body, right knee, initial encounter: Secondary | ICD-10-CM | POA: Insufficient documentation

## 2020-02-12 MED ORDER — LIDOCAINE-EPINEPHRINE (PF) 2 %-1:200000 IJ SOLN
20.0000 mL | Freq: Once | INTRAMUSCULAR | Status: AC
  Administered 2020-02-12: 20 mL via INTRADERMAL
  Filled 2020-02-12 (×2): qty 20

## 2020-02-12 MED ORDER — OXYCODONE-ACETAMINOPHEN 5-325 MG PO TABS
1.0000 | ORAL_TABLET | Freq: Once | ORAL | Status: AC
  Administered 2020-02-12: 1 via ORAL
  Filled 2020-02-12: qty 1

## 2020-02-12 MED ORDER — POVIDONE-IODINE 10 % EX SOLN
CUTANEOUS | Status: AC
  Filled 2020-02-12: qty 15

## 2020-02-12 MED ORDER — LIDOCAINE HCL (PF) 1 % IJ SOLN
INTRAMUSCULAR | Status: AC
  Filled 2020-02-12: qty 20

## 2020-02-12 MED ORDER — TETANUS-DIPHTH-ACELL PERTUSSIS 5-2.5-18.5 LF-MCG/0.5 IM SUSP
0.5000 mL | Freq: Once | INTRAMUSCULAR | Status: AC
  Administered 2020-02-12: 0.5 mL via INTRAMUSCULAR
  Filled 2020-02-12: qty 0.5

## 2020-02-12 NOTE — ED Provider Notes (Signed)
This was a shared visit.  I was asked by my attending physician to repair to this patient's laceration.  This was my only involvement in his care.    LACERATION REPAIR Performed by: Mirenda Baltazar Authorized by: Joshua Soulier Consent: Verbal consent obtained. Risks and benefits: risks, benefits and alternatives were discussed Consent given by: patient Patient identity confirmed: provided demographic data Prepped and Draped in normal sterile fashion Wound explored  Laceration Location: right knee   Laceration Length: 5 cm  No Foreign Bodies seen or palpated  Anesthesia: local infiltration  Local anesthetic: lidocaine 2% w/ epinephrine  Wound explored through range of motion, several small pieces of debris removed without difficulty through irrigation.  Anesthetic total: 3 ml  Irrigation method: syringe Amount of cleaning: standard  Skin closure: 4-0 prolene   Number of sutures: 8  Technique: simple interrupted  Patient tolerance: Patient tolerated the procedure well with no immediate complications.   Dressing applied.  Td updated.     Pauline Aus, PA-C 02/12/20 1359    Linwood Dibbles, MD 02/12/20 [phone]

## 2020-02-12 NOTE — ED Provider Notes (Signed)
[facility] EMERGENCY DEPARTMENT Provider Note   CSN: [id number] Arrival date & time: 02/12/20  1009     History Chief Complaint  Patient presents with   Extremity Laceration    Wesley Williams is a 49 y.o. male.  HPI   Patient presents to the ED for evaluation of a knee laceration.  Patient was cutting down a tree this morning.  He accidentally hit his right knee with the chainsaw and sustained a laceration.  Patient was able to walk in on his own.  He denies any numbness or weakness.  Past Medical History:  Diagnosis Date   Alcohol abuse    Hypertension     Patient Active Problem List   Diagnosis Date Noted   Anemia 12/12/2017   Erectile dysfunction 12/12/2017   Nonintractable headache 11/26/2017   Urinary pain 11/26/2017   Alcohol withdrawal (HCC) 04/25/2016   AKI (acute kidney injury) (HCC) 04/23/2016   Dehydration 04/23/2016   Rhabdomyolysis 04/23/2016   Syncope and collapse 04/23/2016   Alcohol abuse 04/23/2016    History reviewed. No pertinent surgical history.     No family history on file.  Social History   Tobacco Use   Smoking status: Never Smoker   Smokeless tobacco: Never Used  Substance Use Topics   Alcohol use: Yes    Comment: 40 oz per night    Drug use: No    Home Medications Prior to Admission medications   Medication Sig Start Date End Date Taking? Authorizing Provider  amLODipine (NORVASC) 2.5 MG tablet Take 1 tablet (2.5 mg total) by mouth daily. Patient not taking: Reported on 11/26/2017 06/18/16   Trixie Dredge, PA-C  lidocaine (LIDODERM) 5 % Place 1 patch onto the skin daily. Remove & Discard patch within 12 hours or as directed by MD Patient not taking: Reported on 11/26/2017 06/18/16   Trixie Dredge, PA-C  methocarbamol (ROBAXIN) 500 MG tablet Take 1-2 tablets (500-1,000 mg total) by mouth every 6 (six) hours as needed for muscle spasms (and pain). Patient not taking: Reported on 11/26/2017 06/18/16   Trixie Dredge, PA-C    metoprolol succinate (TOPROL XL) 25 MG 24 hr tablet Take 1 tablet (25 mg total) by mouth daily. Patient not taking: Reported on 11/26/2017 06/18/16   Trixie Dredge, PA-C  sildenafil (VIAGRA) 100 MG tablet Take 0.5-1 tablets (50-100 mg total) by mouth daily as needed for erectile dysfunction. Patient not taking: Reported on 12/31/2017 12/12/17   Georgina Quint, MD  tadalafil (CIALIS) 20 MG tablet Take 0.5-1 tablets (10-20 mg total) by mouth every other day as needed for erectile dysfunction. 01/17/18   Georgina Quint, MD  tamsulosin (FLOMAX) 0.4 MG CAPS capsule Take 1 capsule (0.4 mg total) by mouth daily. Patient not taking: Reported on 11/26/2017 06/18/16   Trixie Dredge, PA-C    Allergies    Patient has no known allergies.  Review of Systems   Review of Systems  All other systems reviewed and are negative.   Physical Exam Updated Vital Signs BP 140/83 (BP Location: Right Arm)    Pulse 66    Temp 98.2 F (36.8 C) (Oral)    Resp 18    Ht 1.676 m (5\' 6" )    Wt 93 kg    SpO2 100%    BMI 33.09 kg/m   Physical Exam Vitals and nursing note reviewed.  Constitutional:      General: He is not in acute distress.    Appearance: He is well-developed.  HENT:  Head: Normocephalic and atraumatic.     Right Ear: External ear normal.     Left Ear: External ear normal.  Eyes:     General: No scleral icterus.       Right eye: No discharge.        Left eye: No discharge.     Conjunctiva/sclera: Conjunctivae normal.  Neck:     Trachea: No tracheal deviation.  Cardiovascular:     Rate and Rhythm: Normal rate.  Pulmonary:     Effort: Pulmonary effort is normal. No respiratory distress.     Breath sounds: No stridor.  Abdominal:     General: There is no distension.  Musculoskeletal:        General: No swelling or deformity.     Cervical back: Neck supple.     Comments: Laceration overlying the right patella, no visible bone noted at the base of the wound, small arterial bleeding  that stopped with direct pressure  Skin:    General: Skin is warm and dry.     Findings: No rash.  Neurological:     Mental Status: He is alert.     Cranial Nerves: Cranial nerve deficit: no gross deficits.     ED Results / Procedures / Treatments   Labs (all labs ordered are listed, but only abnormal results are displayed) Labs Reviewed - No data to display  EKG None  Radiology DG Knee Complete 4 Views Right  Result Date: 02/12/2020 CLINICAL DATA:  Chain saw laceration injury to the right knee. EXAM: RIGHT KNEE - COMPLETE 4+ VIEW COMPARISON:  None. FINDINGS: Extensive soft tissue injury involving the anterior and suprapatellar portion of the knee with a bandage in place. I do not see any definite gas in the joint to suggest the joint was breached. No acute fracture is identified. Mild degenerative changes with mild joint space narrowing and spurring. Low lying patella worrisome for quadriceps tendon rupture/laceration. IMPRESSION: 1. Extensive soft tissue injury involving the anterior and suprapatellar portion of the knee. I do not see any definite gas in the joint to suggest the joint was breached. 2. Low lying patella (patella baja) worrisome for quadriceps tendon lacerations/rupture. 3. No acute fracture. Electronically Signed   By: Marijo Sanes M.D.   On: 02/12/2020 12:36    Procedures Procedures (including critical care time)  Medications Ordered in ED Medications  povidone-iodine (BETADINE) 10 % external solution (  Given 02/12/20 1209)  lidocaine-EPINEPHrine (XYLOCAINE W/EPI) 2 %-1:200000 (PF) injection 20 mL (20 mLs Intradermal Given by Other 02/12/20 1307)  Tdap (BOOSTRIX) injection 0.5 mL (0.5 mLs Intramuscular Given 02/12/20 1208)  oxyCODONE-acetaminophen (PERCOCET/ROXICET) 5-325 MG per tablet 1 tablet (1 tablet Oral Given 02/12/20 1322)    ED Course  I have reviewed the triage vital signs and the nursing notes.  Pertinent labs & imaging results that were available during my  care of the patient were reviewed by me and considered in my medical decision making (see chart for details).  Clinical Course as of Feb 12 1356  Tue Feb 12, 2020  1356 X-ray findings reviewed.  Patient has no difficulty holding his extend the leg off the bed.  No findings to suggest quadricep or patellar injury   [JK]    Clinical Course User Index [JK] Dorie Rank, MD   MDM Rules/Calculators/A&P                      Laceration repaired in the ED by PA Triplett.  No  signs of neurovascular or tendon injury.  Discouraged pt from drinking beer while using a chainsaw.  At this time there does not appear to be any evidence of an acute emergency medical condition and the patient appears stable for discharge with appropriate outpatient follow up.  Final Clinical Impression(s) / ED Diagnoses Final diagnoses:  Laceration of right knee, initial encounter    Rx / DC Orders ED Discharge Orders    None       Linwood Dibbles, MD 02/12/20 1357

## 2020-02-12 NOTE — ED Triage Notes (Addendum)
Pt c/o laceration to RT knee. States he was cutting down a tree this morning and accidentally hit his leg with the chain saw. Pt ambulatory. Bleeding controlled at this time. Pt last tetanus > 10 years. Pt states he did drink a beer this morning.

## 2020-02-12 NOTE — Discharge Instructions (Signed)
Suture removal in 10-14 days.  Apply antibiotic ointment daily

## 2020-02-24 ENCOUNTER — Other Ambulatory Visit: Payer: Self-pay

## 2020-02-24 ENCOUNTER — Emergency Department (HOSPITAL_COMMUNITY)
Admission: EM | Admit: 2020-02-24 | Discharge: 2020-02-24 | Disposition: A | Discharge status: Home / Self Care | Payer: Self-pay | Attending: Emergency Medicine | Admitting: Emergency Medicine

## 2020-02-24 ENCOUNTER — Encounter (HOSPITAL_COMMUNITY): Payer: Self-pay

## 2020-02-24 DIAGNOSIS — Z4802 Encounter for removal of sutures: Secondary | ICD-10-CM | POA: Insufficient documentation

## 2020-02-24 DIAGNOSIS — I10 Essential (primary) hypertension: Secondary | ICD-10-CM | POA: Insufficient documentation

## 2020-02-24 NOTE — ED Provider Notes (Signed)
[facility] EMERGENCY DEPARTMENT Provider Note   CSN: [id number] Arrival date & time: 02/24/20  2585     History Chief Complaint  Patient presents with   Suture / Staple Removal    Wesley Williams is a 49 y.o. male p[resenting for suture removal.  8 sutures placed over right patella from chainsaw accident on 6/5.  No fevers or chills.  Minimal pain at home.    HPI     Past Medical History:  Diagnosis Date   Alcohol abuse    Hypertension     Patient Active Problem List   Diagnosis Date Noted   Anemia 12/12/2017   Erectile dysfunction 12/12/2017   Nonintractable headache 11/26/2017   Urinary pain 11/26/2017   Alcohol withdrawal ([place]) 04/25/2016   AKI (acute kidney injury) ([place]) 04/23/2016   Dehydration 04/23/2016   Rhabdomyolysis 04/23/2016   Syncope and collapse 04/23/2016   Alcohol abuse 04/23/2016    History reviewed. No pertinent surgical history.     No family history on file.  Social History   Tobacco Use   Smoking status: Never Smoker   Smokeless tobacco: Never Used  Vaping Use   Vaping Use: Never used  Substance Use Topics   Alcohol use: Yes    Comment: daily   Drug use: No    Home Medications Prior to Admission medications   Medication Sig Start Date End Date Taking? Authorizing Provider  amLODipine (NORVASC) 2.5 MG tablet Take 1 tablet (2.5 mg total) by mouth daily. Patient not taking: Reported on 11/26/2017 06/18/16   Clayton Bibles, PA-C  lidocaine (LIDODERM) 5 % Place 1 patch onto the skin daily. Remove & Discard patch within 12 hours or as directed by MD Patient not taking: Reported on 11/26/2017 06/18/16   Clayton Bibles, PA-C  methocarbamol (ROBAXIN) 500 MG tablet Take 1-2 tablets (500-1,000 mg total) by mouth every 6 (six) hours as needed for muscle spasms (and pain). Patient not taking: Reported on 11/26/2017 06/18/16   Clayton Bibles, PA-C  metoprolol succinate (TOPROL XL) 25 MG 24 hr tablet Take 1 tablet (25 mg total) by mouth  daily. Patient not taking: Reported on 11/26/2017 06/18/16   Clayton Bibles, PA-C  sildenafil (VIAGRA) 100 MG tablet Take 0.5-1 tablets (50-100 mg total) by mouth daily as needed for erectile dysfunction. Patient not taking: Reported on 12/31/2017 12/12/17   Horald Pollen, MD  tadalafil (CIALIS) 20 MG tablet Take 0.5-1 tablets (10-20 mg total) by mouth every other day as needed for erectile dysfunction. 01/17/18   Horald Pollen, MD  tamsulosin (FLOMAX) 0.4 MG CAPS capsule Take 1 capsule (0.4 mg total) by mouth daily. Patient not taking: Reported on 11/26/2017 06/18/16   Clayton Bibles, PA-C    Allergies    Patient has no known allergies.  Review of Systems   Review of Systems  Constitutional: Negative for chills and fever.  Eyes: Negative for pain and visual disturbance.  Respiratory: Negative for cough and shortness of breath.   Cardiovascular: Negative for chest pain and palpitations.  Gastrointestinal: Negative for abdominal pain and vomiting.  Musculoskeletal: Negative for arthralgias, back pain, joint swelling and myalgias.  Skin: Negative for color change and rash.  Neurological: Negative for syncope and headaches.  Psychiatric/Behavioral: Negative for agitation and confusion.  All other systems reviewed and are negative.   Physical Exam Updated Vital Signs BP (!) 145/90 (BP Location: Left Arm)    Pulse 78    Temp 97.8 F (36.6 C) (Oral)    Resp  16    Ht 5\' 6"  (1.676 m)    Wt 92 kg    SpO2 100%    BMI 32.74 kg/m   Physical Exam Vitals and nursing note reviewed.  Constitutional:      Appearance: He is well-developed.  HENT:     Head: Normocephalic and atraumatic.  Eyes:     Conjunctiva/sclera: Conjunctivae normal.  Cardiovascular:     Rate and Rhythm: Normal rate and regular rhythm.     Pulses: Normal pulses.  Pulmonary:     Effort: Pulmonary effort is normal. No respiratory distress.     Breath sounds: Normal breath sounds.  Abdominal:     Palpations: Abdomen  is soft.     Tenderness: There is no abdominal tenderness.  Musculoskeletal:     Cervical back: Neck supple.  Skin:    General: Skin is warm and dry.     Comments: 8 sutures above right patella Scar tissue formed No surroudning erythema, tenderness, or purulence  Neurological:     General: No focal deficit present.     Mental Status: He is alert and oriented to person, place, and time.  Psychiatric:        Mood and Affect: Mood normal.        Behavior: Behavior normal.     ED Results / Procedures / Treatments   Labs (all labs ordered are listed, but only abnormal results are displayed) Labs Reviewed - No data to display  EKG None  Radiology No results found.  Procedures .Suture Removal  Date/Time: 02/24/2020 7:28 AM Performed by: 02/26/2020, MD Authorized by: Terald Sleeper, MD   Consent:    Consent obtained:  Verbal   Consent given by:  Patient   Risks discussed:  Bleeding and pain Location:    Location:  Lower extremity   Lower extremity location:  Leg   Leg location:  R lower leg Procedure details:    Wound appearance:  No signs of infection and clean   Number of sutures removed:  8 Post-procedure details:    Post-removal:  No dressing applied   Patient tolerance of procedure:  Tolerated well, no immediate complications   (including critical care time)  Medications Ordered in ED Medications - No data to display  ED Course  I have reviewed the triage vital signs and the nursing notes.  Pertinent labs & imaging results that were available during my care of the patient were reviewed by me and considered in my medical decision making (see chart for details).  49 yo here for suture removal 8 sutures removed (confirmed 8 were placed) No evidence of site infection or joint infection He has a scar but his leg and knee are otherwise well appearing and he can walk on it steadily  Okay for discharge     Final Clinical Impression(s) / ED  Diagnoses Final diagnoses:  Visit for suture removal    Rx / DC Orders ED Discharge Orders    None       Rudi Knippenberg, 43, MD 02/24/20 [phone]

## 2020-02-24 NOTE — ED Triage Notes (Signed)
Pt reports here for suture removal in R knee.  Denies any complaints.  Pt says he drank 1 shot of liquor this morning.

## 2020-03-13 ENCOUNTER — Ambulatory Visit (INDEPENDENT_AMBULATORY_CARE_PROVIDER_SITE_OTHER): Payer: Self-pay | Admitting: Family Medicine

## 2020-03-13 ENCOUNTER — Other Ambulatory Visit: Payer: Self-pay

## 2020-03-13 VITALS — BP 170/87 | HR 94 | Temp 98.3°F | Ht 65.0 in | Wt 199.2 lb

## 2020-03-13 DIAGNOSIS — R252 Cramp and spasm: Secondary | ICD-10-CM

## 2020-03-13 DIAGNOSIS — R109 Unspecified abdominal pain: Secondary | ICD-10-CM

## 2020-03-13 MED ORDER — METHOCARBAMOL 500 MG PO TABS: ORAL_TABLET | ORAL | 0 refills | Status: DC

## 2020-03-13 MED ORDER — DICLOFENAC SODIUM 75 MG PO TBEC: 75.0000 mg | DELAYED_RELEASE_TABLET | Freq: Two times a day (BID) | ORAL | 0 refills | Status: DC

## 2020-03-13 MED ORDER — PANTOPRAZOLE SODIUM 20 MG PO TBEC: 20.0000 mg | DELAYED_RELEASE_TABLET | Freq: Every day | ORAL | 0 refills | Status: DC

## 2020-03-13 NOTE — Patient Instructions (Addendum)
  Drink a lot of water, especially when working in the hot weather.  Since you are having the pain across the top of your stomach I recommend you stop drinking beer.  Only drink an occasional beer, and do not exceed 1 or 2 at a time.  We are checking lab tests which will be back in a few days and I will let you know what they show.  Take Robaxin 500 mg 1 in the morning, 1 in the afternoon, and 1 at bedtime as needed for the leg cramps.  Take diclofenac 75 mg 1 twice daily for pain and inflammation  Take pantoprazole 20 mg 1 daily for stomach  Your blood pressure is a little bit high today, and I would like you to return in 1 month for a blood pressure recheck.  If not improving at any time return to get rechecked.   If you have lab work done today you will be contacted with your lab results within the next 2 weeks.  If you have not heard from [place] then please contact us. The fastest way to get your results is to register for My Chart.   IF you received an x-ray today, you will receive an invoice from [facility] Radiology. Please contact [facility] Radiology at [phone] with questions or concerns regarding your invoice.   IF you received labwork today, you will receive an invoice from [place]. Please contact [facility] at [phone] with questions or concerns regarding your invoice.   Our billing staff will not be able to assist you with questions regarding bills from these companies.  You will be contacted with the lab results as soon as they are available. The fastest way to get your results is to activate your My Chart account. Instructions are located on the last page of this paperwork. If you have not heard from [place] regarding the results in 2 weeks, please contact this office.

## 2020-03-13 NOTE — Progress Notes (Signed)
Patient ID: JUNAH YAM, male    DOB: 1970-11-24  Age: 49 y.o. MRN: [id number]  Chief Complaint  Patient presents with  . leg crammps    Pt stated that he has been having some cramping in both legs for the past 2 weeks and also experiencing some swelling in the upper part of his stomach.    Subjective:   Patient works as a Production manager.  Over the last couple weeks he has been having a lot of trouble with cramps in his legs.  These wake him up at night.  There is sometimes in his thighs, sometimes in his calves.  He also gets cramping or pain across the upper abdomen.  No nausea or vomiting.  He used to be on blood pressure medication but he is not on anything now.  His blood pressure was high when he came in but he came down to just mildly elevated with a little time.  Current allergies, medications, problem list, past/family and social histories reviewed.  Objective:  BP (!) 170/87   Pulse 94   Temp 98.3 F (36.8 C) (Temporal)   Ht '5\' 5"'  (1.651 m)   Wt 199 lb 3.2 oz (90.4 kg)   SpO2 99%   BMI 33.15 kg/m   Repeat blood pressure reading was 147/89.  Chest clear.  Heart regular.  Abdomen soft without mass or tenderness.  Extremities grossly normal.  Mild medial thigh tenderness on the left.  Straight leg raise is negative.  No edema.  Assessment & Plan:   Assessment: 1. Leg cramps   2. Abdominal cramps       Plan: I imagine working on the hot sun has what to do with this.  He needs to make sure he drinks enough water.  We will check some basic labs to make sure the electrolytes and magnesium look okay.  His upper abdominal symptoms could be from drinking beer regularly.  Suggested he drink less.  Orders Placed This Encounter  Procedures  . CMP14+EGFR  . CBC  . Magnesium    Meds ordered this encounter  Medications  . methocarbamol (ROBAXIN) 500 MG tablet    Sig: Take 1 in the morning, 1 in the afternoon, and 1 or 2 at bedtime for muscle relaxant as needed.    Dispense:   30 tablet    Refill:  0  . diclofenac (VOLTAREN) 75 MG EC tablet    Sig: Take 1 tablet (75 mg total) by mouth 2 (two) times daily.    Dispense:  30 tablet    Refill:  0  . pantoprazole (PROTONIX) 20 MG tablet    Sig: Take 1 tablet (20 mg total) by mouth daily.    Dispense:  30 tablet    Refill:  0         Patient Instructions    Drink a lot of water, especially when working in the hot weather.  Since you are having the pain across the top of your stomach I recommend you stop drinking beer.  Only drink an occasional beer, and do not exceed 1 or 2 at a time.  We are checking lab tests which will be back in a few days and I will let you know what they show.  Take Robaxin 500 mg 1 in the morning, 1 in the afternoon, and 1 at bedtime as needed for the leg cramps.  Take diclofenac 75 mg 1 twice daily for pain and inflammation  Take pantoprazole 20 mg 1  daily for stomach  Your blood pressure is a little bit high today, and I would like you to return in 1 month for a blood pressure recheck.  If not improving at any time return to get rechecked.   If you have lab work done today you will be contacted with your lab results within the next 2 weeks.  If you have not heard from [place] then please contact us. The fastest way to get your results is to register for My Chart.   IF you received an x-ray today, you will receive an invoice from [facility] Radiology. Please contact [facility] Radiology at [phone] with questions or concerns regarding your invoice.   IF you received labwork today, you will receive an invoice from [place]. Please contact [facility] at [phone] with questions or concerns regarding your invoice.   Our billing staff will not be able to assist you with questions regarding bills from these companies.  You will be contacted with the lab results as soon as they are available. The fastest way to get your results is to activate your My Chart account. Instructions  are located on the last page of this paperwork. If you have not heard from [place] regarding the results in 2 weeks, please contact this office.        Return if symptoms worsen or fail to improve.   Ruben Reason, MD 03/13/2020

## 2020-03-14 ENCOUNTER — Encounter: Payer: Self-pay | Admitting: Emergency Medicine

## 2020-03-14 ENCOUNTER — Telehealth: Payer: Self-pay | Admitting: Emergency Medicine

## 2020-03-14 ENCOUNTER — Other Ambulatory Visit: Payer: Self-pay | Admitting: Family Medicine

## 2020-03-14 DIAGNOSIS — D638 Anemia in other chronic diseases classified elsewhere: Secondary | ICD-10-CM

## 2020-03-14 LAB — CMP14+EGFR
ALT: 18 IU/L (ref 0–44)
AST: 54 IU/L — ABNORMAL HIGH (ref 0–40)
Albumin/Globulin Ratio: 0.5 — ABNORMAL LOW (ref 1.2–2.2)
Albumin: 3.1 g/dL — ABNORMAL LOW (ref 4.0–5.0)
Alkaline Phosphatase: 118 IU/L (ref 48–121)
BUN/Creatinine Ratio: 18 (ref 9–20)
BUN: 12 mg/dL (ref 6–24)
Bilirubin Total: 3.7 mg/dL — ABNORMAL HIGH (ref 0.0–1.2)
CO2: 22 mmol/L (ref 20–29)
Calcium: 8.7 mg/dL (ref 8.7–10.2)
Chloride: 100 mmol/L (ref 96–106)
Creatinine, Ser: 0.65 mg/dL — ABNORMAL LOW (ref 0.76–1.27)
GFR calc Af Amer: 131 mL/min/{1.73_m2} (ref >59)
GFR calc non Af Amer: 113 mL/min/{1.73_m2} (ref >59)
Globulin, Total: 5.9 g/dL — ABNORMAL HIGH (ref 1.5–4.5)
Glucose: 118 mg/dL — ABNORMAL HIGH (ref 65–99)
Potassium: 4.6 mmol/L (ref 3.5–5.2)
Sodium: 135 mmol/L (ref 134–144)
Total Protein: 9 g/dL — ABNORMAL HIGH (ref 6.0–8.5)

## 2020-03-14 LAB — MAGNESIUM: Magnesium: 1.5 mg/dL — ABNORMAL LOW (ref 1.6–2.3)

## 2020-03-14 LAB — CBC
Hematocrit: 21.2 % — ABNORMAL LOW (ref 37.5–51.0)
Hemoglobin: 6.2 g/dL — CL (ref 13.0–17.7)
MCH: 22.7 pg — ABNORMAL LOW (ref 26.6–33.0)
MCHC: 29.2 g/dL — ABNORMAL LOW (ref 31.5–35.7)
MCV: 78 fL — ABNORMAL LOW (ref 79–97)
Platelets: 30 10*3/uL — CL (ref 150–450)
RBC: 2.73 x10E6/uL — CL (ref 4.14–5.80)
RDW: 17.1 % — ABNORMAL HIGH (ref 11.6–15.4)
WBC: 4 10*3/uL (ref 3.4–10.8)

## 2020-03-14 NOTE — Telephone Encounter (Signed)
Sue Lush [facility] reports that patient needs to be contacted asap/ and needs to go to ER preferably [facility] critical 6.2 Hemoglobin.  They have no availability to see patient

## 2020-03-14 NOTE — Progress Notes (Signed)
I tried to contact this patient of yours who I saw yesterday but was unable to reach him.  I have asked the staff to try and call him.  Is complaining of cramping and his tests show he is severely anemic.  He was severely anemic when you saw him 2 years ago.  I have asked them to get him into see you early next week.  I doubt it is acute blood or blood loss.  He also has a hyper proteinemia which needs to be evaluated.  I am also putting through a referral to a hematologist.Alicea Wente H. Alwyn Ren, MD

## 2020-03-14 NOTE — Telephone Encounter (Signed)
Dr. Alwyn Ren has already tried to contact this pt but was unsuccessful.

## 2020-03-14 NOTE — Telephone Encounter (Signed)
Called pt number several times / unable to contact pt   Called emergency contact Melody Haver (Brother) spoke with him and ask that if he could get a hold of hi  brother  And ask him to give [place] back at [phone]

## 2020-03-14 NOTE — Progress Notes (Signed)
Urgent call:I tried to call the patient myself and only got voicemail.  He needs to be contacted and arrangements made for him to see Dr. Alvy Bimler next week.  He is extremely anemic and has a high blood protein level.  He needs to be instructed to go to the emergency room if he is getting weaker or having more cramps.  He is to stop taking the diclofenac that I gave him yesterday.  If he sees any blood in his stool or has any tarry black stools he should go to the emergency room.  He should increase the pantoprazole that I gave him yesterday to twice daily.  He should not drink any alcohol.  Try and get him into see Dr. Alvy Bimler early next week.  Sandria Bales. Alwyn Ren, MD

## 2020-03-14 NOTE — Telephone Encounter (Signed)
Called pt to verify if he needs appt for 03/17/2020 /he was seen 03/13/2020 by Dr.Hopper . LVM for him to call back

## 2020-03-17 ENCOUNTER — Inpatient Hospital Stay (HOSPITAL_COMMUNITY)
Admission: EM | Admit: 2020-03-17 | Discharge: 2020-03-20 | DRG: 432 | Disposition: A | Discharge status: Home / Self Care | Payer: Self-pay | Attending: Family Medicine | Admitting: Family Medicine

## 2020-03-17 ENCOUNTER — Observation Stay (HOSPITAL_COMMUNITY): Payer: Self-pay

## 2020-03-17 ENCOUNTER — Other Ambulatory Visit: Payer: Self-pay

## 2020-03-17 ENCOUNTER — Ambulatory Visit: Payer: Self-pay | Admitting: Emergency Medicine

## 2020-03-17 ENCOUNTER — Encounter (HOSPITAL_COMMUNITY): Payer: Self-pay

## 2020-03-17 ENCOUNTER — Telehealth: Payer: Self-pay | Admitting: *Deleted

## 2020-03-17 ENCOUNTER — Emergency Department (HOSPITAL_COMMUNITY): Payer: Self-pay

## 2020-03-17 DIAGNOSIS — K648 Other hemorrhoids: Secondary | ICD-10-CM | POA: Diagnosis present

## 2020-03-17 DIAGNOSIS — K7030 Alcoholic cirrhosis of liver without ascites: Principal | ICD-10-CM | POA: Diagnosis present

## 2020-03-17 DIAGNOSIS — Z833 Family history of diabetes mellitus: Secondary | ICD-10-CM

## 2020-03-17 DIAGNOSIS — D638 Anemia in other chronic diseases classified elsewhere: Secondary | ICD-10-CM | POA: Diagnosis present

## 2020-03-17 DIAGNOSIS — D689 Coagulation defect, unspecified: Secondary | ICD-10-CM | POA: Diagnosis not present

## 2020-03-17 DIAGNOSIS — N529 Male erectile dysfunction, unspecified: Secondary | ICD-10-CM | POA: Diagnosis present

## 2020-03-17 DIAGNOSIS — Z9114 Patient's other noncompliance with medication regimen: Secondary | ICD-10-CM

## 2020-03-17 DIAGNOSIS — I868 Varicose veins of other specified sites: Secondary | ICD-10-CM | POA: Diagnosis present

## 2020-03-17 DIAGNOSIS — M6282 Rhabdomyolysis: Secondary | ICD-10-CM | POA: Diagnosis present

## 2020-03-17 DIAGNOSIS — F1020 Alcohol dependence, uncomplicated: Secondary | ICD-10-CM | POA: Diagnosis present

## 2020-03-17 DIAGNOSIS — K649 Unspecified hemorrhoids: Secondary | ICD-10-CM | POA: Diagnosis present

## 2020-03-17 DIAGNOSIS — D61818 Other pancytopenia: Secondary | ICD-10-CM | POA: Diagnosis present

## 2020-03-17 DIAGNOSIS — R140 Abdominal distension (gaseous): Secondary | ICD-10-CM | POA: Diagnosis present

## 2020-03-17 DIAGNOSIS — I10 Essential (primary) hypertension: Secondary | ICD-10-CM | POA: Diagnosis present

## 2020-03-17 DIAGNOSIS — I8510 Secondary esophageal varices without bleeding: Secondary | ICD-10-CM | POA: Diagnosis present

## 2020-03-17 DIAGNOSIS — Z23 Encounter for immunization: Secondary | ICD-10-CM

## 2020-03-17 DIAGNOSIS — K219 Gastro-esophageal reflux disease without esophagitis: Secondary | ICD-10-CM | POA: Diagnosis present

## 2020-03-17 DIAGNOSIS — K5731 Diverticulosis of large intestine without perforation or abscess with bleeding: Secondary | ICD-10-CM | POA: Diagnosis present

## 2020-03-17 DIAGNOSIS — D649 Anemia, unspecified: Secondary | ICD-10-CM

## 2020-03-17 DIAGNOSIS — D500 Iron deficiency anemia secondary to blood loss (chronic): Secondary | ICD-10-CM | POA: Diagnosis present

## 2020-03-17 DIAGNOSIS — R17 Unspecified jaundice: Secondary | ICD-10-CM | POA: Diagnosis present

## 2020-03-17 DIAGNOSIS — R300 Dysuria: Secondary | ICD-10-CM | POA: Diagnosis present

## 2020-03-17 DIAGNOSIS — K8020 Calculus of gallbladder without cholecystitis without obstruction: Secondary | ICD-10-CM | POA: Diagnosis present

## 2020-03-17 DIAGNOSIS — F1010 Alcohol abuse, uncomplicated: Secondary | ICD-10-CM | POA: Diagnosis present

## 2020-03-17 DIAGNOSIS — R202 Paresthesia of skin: Secondary | ICD-10-CM | POA: Diagnosis present

## 2020-03-17 DIAGNOSIS — Z79899 Other long term (current) drug therapy: Secondary | ICD-10-CM

## 2020-03-17 DIAGNOSIS — K766 Portal hypertension: Secondary | ICD-10-CM | POA: Diagnosis present

## 2020-03-17 DIAGNOSIS — Z87440 Personal history of urinary (tract) infections: Secondary | ICD-10-CM

## 2020-03-17 DIAGNOSIS — Z20822 Contact with and (suspected) exposure to covid-19: Secondary | ICD-10-CM | POA: Diagnosis present

## 2020-03-17 DIAGNOSIS — I8511 Secondary esophageal varices with bleeding: Secondary | ICD-10-CM | POA: Diagnosis present

## 2020-03-17 DIAGNOSIS — K6389 Other specified diseases of intestine: Secondary | ICD-10-CM | POA: Diagnosis present

## 2020-03-17 HISTORY — DX: Secondary esophageal varices without bleeding: I85.10

## 2020-03-17 HISTORY — DX: Unspecified cirrhosis of liver: K74.60

## 2020-03-17 HISTORY — DX: Portal hypertension: K76.6

## 2020-03-17 LAB — IRON AND TIBC
Iron: 21 ug/dL — ABNORMAL LOW (ref 45–182)
Saturation Ratios: 6 % — ABNORMAL LOW (ref 17.9–39.5)
TIBC: 357 ug/dL (ref 250–450)
UIBC: 336 ug/dL

## 2020-03-17 LAB — VITAMIN B12: Vitamin B-12: 1392 pg/mL — ABNORMAL HIGH (ref 180–914)

## 2020-03-17 LAB — COMPREHENSIVE METABOLIC PANEL
ALT: 22 U/L (ref 0–44)
AST: 53 U/L — ABNORMAL HIGH (ref 15–41)
Albumin: 2.6 g/dL — ABNORMAL LOW (ref 3.5–5.0)
Alkaline Phosphatase: 88 U/L (ref 38–126)
Anion gap: 11 (ref 5–15)
BUN: 16 mg/dL (ref 6–20)
CO2: 19 mmol/L — ABNORMAL LOW (ref 22–32)
Calcium: 8.7 mg/dL — ABNORMAL LOW (ref 8.9–10.3)
Chloride: 107 mmol/L (ref 98–111)
Creatinine, Ser: 1.09 mg/dL (ref 0.61–1.24)
GFR calc Af Amer: >60 mL/min (ref >60)
GFR calc non Af Amer: >60 mL/min (ref >60)
Glucose, Bld: 126 mg/dL — ABNORMAL HIGH (ref 70–99)
Potassium: 3.5 mmol/L (ref 3.5–5.1)
Sodium: 137 mmol/L (ref 135–145)
Total Bilirubin: 3.6 mg/dL — ABNORMAL HIGH (ref 0.3–1.2)
Total Protein: 8.9 g/dL — ABNORMAL HIGH (ref 6.5–8.1)

## 2020-03-17 LAB — RAPID URINE DRUG SCREEN, HOSP PERFORMED
Amphetamines: NOT DETECTED
Barbiturates: NOT DETECTED
Benzodiazepines: NOT DETECTED
Cocaine: NOT DETECTED
Opiates: NOT DETECTED
Tetrahydrocannabinol: NOT DETECTED

## 2020-03-17 LAB — CBC WITH DIFFERENTIAL/PLATELET
Abs Immature Granulocytes: 0.02 10*3/uL (ref 0.00–0.07)
Basophils Absolute: 0.1 10*3/uL (ref 0.0–0.1)
Basophils Relative: 1 %
Eosinophils Absolute: 0.1 10*3/uL (ref 0.0–0.5)
Eosinophils Relative: 1 %
HCT: 24.2 % — ABNORMAL LOW (ref 39.0–52.0)
Hemoglobin: 6.5 g/dL — CL (ref 13.0–17.0)
Immature Granulocytes: 1 %
Lymphocytes Relative: 12 %
Lymphs Abs: 0.4 10*3/uL — ABNORMAL LOW (ref 0.7–4.0)
MCH: 22.6 pg — ABNORMAL LOW (ref 26.0–34.0)
MCHC: 26.9 g/dL — ABNORMAL LOW (ref 30.0–36.0)
MCV: 84 fL (ref 80.0–100.0)
Monocytes Absolute: 0.7 10*3/uL (ref 0.1–1.0)
Monocytes Relative: 19 %
Neutro Abs: 2.4 10*3/uL (ref 1.7–7.7)
Neutrophils Relative %: 66 %
Platelets: 80 10*3/uL — ABNORMAL LOW (ref 150–400)
RBC: 2.88 MIL/uL — ABNORMAL LOW (ref 4.22–5.81)
RDW: 20.2 % — ABNORMAL HIGH (ref 11.5–15.5)
WBC: 3.6 10*3/uL — ABNORMAL LOW (ref 4.0–10.5)
nRBC: 0 % (ref 0.0–0.2)

## 2020-03-17 LAB — RETICULOCYTES
Immature Retic Fract: 21.9 % — ABNORMAL HIGH (ref 2.3–15.9)
RBC.: 2.55 MIL/uL — ABNORMAL LOW (ref 4.22–5.81)
Retic Count, Absolute: 97.4 10*3/uL (ref 19.0–186.0)
Retic Ct Pct: 3.8 % — ABNORMAL HIGH (ref 0.4–3.1)

## 2020-03-17 LAB — URINALYSIS, ROUTINE W REFLEX MICROSCOPIC
Bacteria, UA: NONE SEEN
Bilirubin Urine: NEGATIVE
Glucose, UA: NEGATIVE mg/dL
Ketones, ur: NEGATIVE mg/dL
Leukocytes,Ua: NEGATIVE
Nitrite: NEGATIVE
Protein, ur: NEGATIVE mg/dL
Specific Gravity, Urine: 1.045 — ABNORMAL HIGH (ref 1.005–1.030)
pH: 6 (ref 5.0–8.0)

## 2020-03-17 LAB — HEPATIC FUNCTION PANEL
ALT: 23 U/L (ref 0–44)
AST: 45 U/L — ABNORMAL HIGH (ref 15–41)
Albumin: 2.5 g/dL — ABNORMAL LOW (ref 3.5–5.0)
Alkaline Phosphatase: 83 U/L (ref 38–126)
Bilirubin, Direct: 1.4 mg/dL — ABNORMAL HIGH (ref 0.0–0.2)
Indirect Bilirubin: 1.6 mg/dL — ABNORMAL HIGH (ref 0.3–0.9)
Total Bilirubin: 3 mg/dL — ABNORMAL HIGH (ref 0.3–1.2)
Total Protein: 8.7 g/dL — ABNORMAL HIGH (ref 6.5–8.1)

## 2020-03-17 LAB — TROPONIN I (HIGH SENSITIVITY)
Troponin I (High Sensitivity): 6 ng/L (ref <18)
Troponin I (High Sensitivity): 7 ng/L (ref <18)

## 2020-03-17 LAB — PROTIME-INR
INR: 1.5 — ABNORMAL HIGH (ref 0.8–1.2)
Prothrombin Time: 17.8 seconds — ABNORMAL HIGH (ref 11.4–15.2)

## 2020-03-17 LAB — CK: Total CK: 111 U/L (ref 49–397)

## 2020-03-17 LAB — POC OCCULT BLOOD, ED: Fecal Occult Bld: NEGATIVE

## 2020-03-17 LAB — ABO/RH: ABO/RH(D): A POS

## 2020-03-17 LAB — SARS CORONAVIRUS 2 BY RT PCR (HOSPITAL ORDER, PERFORMED IN ~~LOC~~ HOSPITAL LAB): SARS Coronavirus 2: NEGATIVE

## 2020-03-17 LAB — FERRITIN: Ferritin: 10 ng/mL — ABNORMAL LOW (ref 24–336)

## 2020-03-17 LAB — FOLATE: Folate: 27.9 ng/mL (ref >5.9)

## 2020-03-17 LAB — PREPARE RBC (CROSSMATCH)

## 2020-03-17 MED ORDER — FOLIC ACID 1 MG PO TABS
1.0000 mg | ORAL_TABLET | Freq: Every day | ORAL | Status: DC
  Administered 2020-03-17 – 2020-03-20 (×4): 1 mg via ORAL
  Filled 2020-03-17 (×4): qty 1

## 2020-03-17 MED ORDER — ADULT MULTIVITAMIN W/MINERALS CH
1.0000 | ORAL_TABLET | Freq: Every day | ORAL | Status: DC
  Administered 2020-03-17 – 2020-03-20 (×4): 1 via ORAL
  Filled 2020-03-17 (×4): qty 1

## 2020-03-17 MED ORDER — TAMSULOSIN HCL 0.4 MG PO CAPS
0.4000 mg | ORAL_CAPSULE | Freq: Every day | ORAL | Status: DC
  Administered 2020-03-17 – 2020-03-20 (×4): 0.4 mg via ORAL
  Filled 2020-03-17 (×4): qty 1

## 2020-03-17 MED ORDER — IOHEXOL 300 MG/ML  SOLN
100.0000 mL | Freq: Once | INTRAMUSCULAR | Status: AC | PRN
  Administered 2020-03-17: 100 mL via INTRAVENOUS

## 2020-03-17 MED ORDER — METOPROLOL SUCCINATE ER 25 MG PO TB24
25.0000 mg | ORAL_TABLET | Freq: Every day | ORAL | Status: DC
  Administered 2020-03-17 – 2020-03-19 (×3): 25 mg via ORAL
  Filled 2020-03-17 (×3): qty 1

## 2020-03-17 MED ORDER — THIAMINE HCL 100 MG PO TABS
100.0000 mg | ORAL_TABLET | Freq: Every day | ORAL | Status: DC
  Administered 2020-03-17 – 2020-03-20 (×4): 100 mg via ORAL
  Filled 2020-03-17 (×4): qty 1

## 2020-03-17 MED ORDER — SODIUM CHLORIDE 0.9 % IV SOLN
10.0000 mL/h | Freq: Once | INTRAVENOUS | Status: AC
  Administered 2020-03-17: 10 mL/h via INTRAVENOUS

## 2020-03-17 MED ORDER — PANTOPRAZOLE SODIUM 20 MG PO TBEC
20.0000 mg | DELAYED_RELEASE_TABLET | Freq: Every day | ORAL | Status: DC
  Administered 2020-03-17 – 2020-03-20 (×4): 20 mg via ORAL
  Filled 2020-03-17 (×4): qty 1

## 2020-03-17 NOTE — H&P (Addendum)
Wesley Williams Admission History and Physical Service Pager: [phone]  Patient name: Wesley Williams Medical record number: [id number] Date of birth: [birth year] Age: 49 y.o. Gender: male  Primary Care Provider: Horald Pollen, MD Consultants: None Code Status: FULL Preferred Emergency Contact: Sherryle Lis significant other [phone]  Chief Complaint: Chest discomfort and bilateral leg cramping  Assessment and Plan: Wesley Williams is a 49 y.o. male presenting with 1 to 2-week history of chest discomfort and bilateral leg cramping. PMH is significant for rhabdomyolysis, alcohol abuse and withdrawal, hypertension, AKI, dysuria, erectile dysfunction.  Anemia of Chronic Disease [name] reports a 2-week history of fatigue, chest discomfort, and bilateral leg cramps.  His PCP performed labs on 7/8 and found his hemoglobin to be 6.2, called him and instructed to come to the ED.  Admission hemoglobin 6.  One unit of blood ordered for transfusion.  Platelets 80, WBC 3.6.  RBC 2.88 > 2.55, reticulocyte count 3.8% (absolute wnl at 97.4), immature reticulocyte fraction 21.9%. MCV 84. Iron 21. TIBC 357. Folate 27.9  PTT 17.8, INR 1.5.  FOBT negative.  Denies hemoptysis, BRBPR, dark tarry stools. Upon admission he also denies having feelings of fatigue, shortness of breath, or chest pain even with working. ED EKG shows normal sinus rhythm, 72 bpm, normal axis, appropriate R wave progression, minimal ST depression in V3 and V4.  Based on historical PCP visit notes, this appears to be a longstanding issue with anemia of chronic disease.  Wonder what chronic disease is causing this.  Differential includes hepatic cirrhosis vs. Hepatitis vs. Malignancy. It does not appear that he has ever had abdominal imaging and was only recently referred to hematology in the outpatient setting. Does not appear to have an acute GI source. Would not consult GI at this time but could consider in  future evaluation and management. Will admit for transfusion and consider continued evaluation for anemia of Chronic disease. Will consider consulting hematology while patient admitted.  -Admit to Med-surg, OBV FPTS, Attending Dr. Nori Riis -ED perform type and cross, ordered 1 unit of blood for transfusion. -Follow-up posttransfusion H&H -Follow-up CT abdomen/pelvis -Labs to follow-up tonight: Hepatitis panel, HIV antibody, urinalysis, UDS -Labs follow-up tomorrow: BMP, CBC with differential, magnesium, pro time/INR  Thrombocytopenia  Leukopenia  Elevated total protein Platelets 80, WBC 3.6, total protein 8.9, albumin 2.6, total bilirubin 3.6. PTT 17.8, INR 1.5. Last CBC 03/13/20 Platelets of 30, WBC 4.0. Will follow up CT and consider consulting hematology as above. -Follow-up CBC, pro time/INR -Follow-up CT abdomen -Consider hematology consult   Elevated AST AST 53, ALT 22, alk phos 88. Likely contributory from longstanding alcoholism.  -Follow-up LFT panel Tuesday morning  Hx of rhabdomyolysis Patient present with chief complaint of leg cramps. Episode in August 2017: CK 1232 > 1119 > 800 > 429.  This admission, CK 111. -Consider the need for IVF   HTN No established diagnosis of hypertension, however is on home meds of amlodipine 2.5 daily, metoprolol XL 25 mg daily.  On admission, is hypertensive.  Systolics 856-314, diastolic 97-02.  Pulse 66-1 04. -Continue to monitor vitals -Continue metoprolol 25 mg daily -Hold amlodipine 2.5 daily at this time, consider adding on in future  Hx alcoholism History of alcoholism, however says that he has cut down on his drinking.  Drinking of choice is Bud Light.  Last drink on July fourth. Denies ever having shakes or seizures from alcohol withdrawal. AST 33, ALT 22, alk phos 88. -CIWA daily -Thiamine  100 mg daily, multivitamin daily, folic acid 1 mg daily -Continue to monitor vitals  Hx UTI  Intermittent suprapubic pain Patient endorses  intermittent suprapubic pain with urination for the past month or so.  History of UTI.  Home med of tamsulosin 0.4 mg daily although no diagnosis of BPH. -Follow-up urinalysis -Follow-up urine culture -Tamsulosin 0.4 mg daily  GERD On Protonix 20 mg daily at home. -Continue Protonix  FEN/GI: Regular diet Prophylaxis: SCDs due to elevated PTT INR of unknown etiology  Disposition: MedSurg  History of Present Illness:  Wesley Williams is a 49 y.o. male presenting with symptomatic anemia. He has been having leg cramps for 2 weeks. Feeling a little tired but nothing more than normal. 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 Does not endorse headaches, vision changes, weakness. Denies feeling like his heart is racing and shortness of breath. Says he is not short of breath with work either. Has occasional paresthesias. Was prescribed pills by PCP that helped with leg cramps. Went to PCP yesterday for blood work and was told to come into ED for a transfusion. He was working when he got the call to come. Says he had belly swelling for a couple of days but is now normal. Last drink was July 4 and is not sure what its was but says his drink of choice is budlight.  Review Of Systems: Per HPI with the following additions:   Review of Systems  Constitutional: Negative for fatigue.  HENT: Negative for nosebleeds.   Eyes: Negative for visual disturbance.  Respiratory: Negative for shortness of breath.   Cardiovascular: Negative for chest pain and palpitations.  Gastrointestinal: Negative for abdominal pain, blood in stool, constipation and diarrhea.  Genitourinary: Positive for dysuria. Negative for difficulty urinating and urgency.  Musculoskeletal: Positive for myalgias.  Neurological: Negative for dizziness, light-headedness and headaches.     Patient Active Problem List   Diagnosis Date Noted  . Anemia 12/12/2017  . Erectile dysfunction 12/12/2017  . Nonintractable  headache 11/26/2017  . Urinary pain 11/26/2017  . Alcohol withdrawal ([place]) 04/25/2016  . AKI (acute kidney injury) ([place]) 04/23/2016  . Dehydration 04/23/2016  . Rhabdomyolysis 04/23/2016  . Syncope and collapse 04/23/2016  . Alcohol abuse 04/23/2016    Past Medical History: Past Medical History:  Diagnosis Date  . Alcohol abuse   . Hypertension     Past Surgical History: History reviewed. No pertinent surgical history.  Social History: Social History   Tobacco Use  . Smoking status: Never Smoker  . Smokeless tobacco: Never Used  Vaping Use  . Vaping Use: Never used  Substance Use Topics  . Alcohol use: Yes    Comment: daily  . Drug use: No    Family History: History reviewed. No pertinent family history.   Allergies and Medications: No Known Allergies No current facility-administered medications on file prior to encounter.   Current Outpatient Medications on File Prior to Encounter  Medication Sig Dispense Refill  . diclofenac (VOLTAREN) 75 MG EC tablet Take 1 tablet (75 mg total) by mouth 2 (two) times daily. 30 tablet 0  . methocarbamol (ROBAXIN) 500 MG tablet Take 1 in the morning, 1 in the afternoon, and 1 or 2 at bedtime for muscle relaxant as needed. 30 tablet 0  . pantoprazole (PROTONIX) 20 MG tablet Take 1 tablet (20 mg total) by mouth daily. (Patient taking differently: Take 20 mg by mouth daily as needed for heartburn or indigestion. )  30 tablet 0  . amLODipine (NORVASC) 2.5 MG tablet Take 1 tablet (2.5 mg total) by mouth daily. (Patient not taking: Reported on 03/17/2020) 30 tablet 0  . metoprolol succinate (TOPROL XL) 25 MG 24 hr tablet Take 1 tablet (25 mg total) by mouth daily. (Patient not taking: Reported on 03/17/2020) 30 tablet 0  . tamsulosin (FLOMAX) 0.4 MG CAPS capsule Take 1 capsule (0.4 mg total) by mouth daily. (Patient not taking: Reported on 03/17/2020) 30 capsule 0    Objective: BP (!) 146/86   Pulse 79   Temp 98 F (36.7 C) (Oral)    Resp 20   SpO2 100%   Exam: General: Awake, alert, oriented Eyes: Scleral icterus, PERRL ENTM: Sublingual icterus, moist mucosa, poor dentition, cobblestoning of right nare Cardiovascular: Regular rate and rhythm, no murmurs or gallops appreciated, no BLE edema Respiratory: Lungs clear to auscultation bilaterally Gastrointestinal: Bowel sounds present, no tenderness to palpation, no fluid wave, no rebound tenderness Derm: Facial jaundice Neuro: EOM intact, symmetrical smile, no uvular deviation Psych: Oriented, normal mood and affect  Labs and Imaging: CBC BMET  Recent Labs  Lab 03/17/20 1157  WBC 3.6*  HGB 6.5*  HCT 24.2*  PLT 80*   Recent Labs  Lab 03/17/20 1157  NA 137  K 3.5  CL 107  CO2 19*  BUN 16  CREATININE 1.09  GLUCOSE 126*  CALCIUM 8.7*     CHEST - 2 VIEW COMPARISON:  None. IMPRESSION: No active cardiopulmonary disease.  EKG: Normal sinus rhythm, 72 bpm, normal axis, appropriate R wave progression, minimal ST depression in V3 and V4.  Similar to prior.   Ezequiel Essex, MD 03/17/2020, 4:31 PM PGY-1, [place] Intern pager: [phone], text pages welcome  FPTS Upper-Level Resident Addendum   I have independently interviewed and examined the patient. I have discussed the above with the original author and agree with their documentation. My edits for correction/addition/clarification are in [place]. Please see also any attending notes.   Gerlene Fee, DO PGY-2, [facility] 03/17/2020 7:43 PM  [place] Service pager: [phone] (text pages welcome through [facility])

## 2020-03-17 NOTE — ED Notes (Signed)
Patient transported to X-ray 

## 2020-03-17 NOTE — ED Notes (Signed)
RN obtained pt's consent for blood products. 

## 2020-03-17 NOTE — ED Provider Notes (Addendum)
[facility]   CSN: [id number] Arrival date & time: 03/17/20  1142     History Chief Complaint  Patient presents with   Anemia    Wesley ShyJose A Williams is a 49 y.o. male.  The history is provided by the patient and medical records. No language interpreter was used.  Illness Location:  Cramps in legs, exertional chest discomfort, lightheadedness and fatigue. Severity:  Moderate Onset quality:  Gradual Duration:  1 week Timing:  Constant Progression:  Waxing and waning Chronicity:  New Associated symptoms: chest pain, fatigue and myalgias   Associated symptoms: no abdominal pain, no congestion, no cough, no diarrhea, no fever, no headaches, no loss of consciousness, no nausea, no rash, no shortness of breath, no vomiting and no wheezing        Past Medical History:  Diagnosis Date   Alcohol abuse    Hypertension     Patient Active Problem List   Diagnosis Date Noted   Anemia 12/12/2017   Erectile dysfunction 12/12/2017   Nonintractable headache 11/26/2017   Urinary pain 11/26/2017   Alcohol withdrawal (HCC) 04/25/2016   AKI (acute kidney injury) (HCC) 04/23/2016   Dehydration 04/23/2016   Rhabdomyolysis 04/23/2016   Syncope and collapse 04/23/2016   Alcohol abuse 04/23/2016    History reviewed. No pertinent surgical history.     History reviewed. No pertinent family history.  Social History   Tobacco Use   Smoking status: Never Smoker   Smokeless tobacco: Never Used  Vaping Use   Vaping Use: Never used  Substance Use Topics   Alcohol use: Yes    Comment: daily   Drug use: No    Home Medications Prior to Admission medications   Medication Sig Start Date End Date Taking? Authorizing Provider  amLODipine (NORVASC) 2.5 MG tablet Take 1 tablet (2.5 mg total) by mouth daily. 06/18/16   Trixie DredgeWest, Emily, PA-C  diclofenac (VOLTAREN) 75 MG EC tablet Take 1 tablet (75 mg total) by mouth 2 (two) times  daily. 03/13/20   Peyton NajjarHopper, David H, MD  lidocaine (LIDODERM) 5 % Place 1 patch onto the skin daily. Remove & Discard patch within 12 hours or as directed by MD 06/18/16   Trixie DredgeWest, Emily, PA-C  methocarbamol (ROBAXIN) 500 MG tablet Take 1 in the morning, 1 in the afternoon, and 1 or 2 at bedtime for muscle relaxant as needed. 03/13/20   Peyton NajjarHopper, David H, MD  metoprolol succinate (TOPROL XL) 25 MG 24 hr tablet Take 1 tablet (25 mg total) by mouth daily. 06/18/16   Trixie DredgeWest, Emily, PA-C  pantoprazole (PROTONIX) 20 MG tablet Take 1 tablet (20 mg total) by mouth daily. 03/13/20   Peyton NajjarHopper, David H, MD  sildenafil (VIAGRA) 100 MG tablet Take 0.5-1 tablets (50-100 mg total) by mouth daily as needed for erectile dysfunction. 12/12/17   Georgina QuintSagardia, Miguel Shamal, MD  tadalafil (CIALIS) 20 MG tablet Take 0.5-1 tablets (10-20 mg total) by mouth every other day as needed for erectile dysfunction. 01/17/18   Georgina QuintSagardia, Miguel Jeferson, MD  tamsulosin (FLOMAX) 0.4 MG CAPS capsule Take 1 capsule (0.4 mg total) by mouth daily. 06/18/16   Trixie DredgeWest, Emily, PA-C    Allergies    Patient has no known allergies.  Review of Systems   Review of Systems  Constitutional: Positive for fatigue. Negative for chills, diaphoresis and fever.  HENT: Negative for congestion.   Eyes: Negative for visual disturbance.  Respiratory: Negative for cough, shortness of breath and wheezing.  Cardiovascular: Positive for chest pain.  Gastrointestinal: Negative for abdominal pain, diarrhea, nausea and vomiting.  Genitourinary: Negative for flank pain.  Musculoskeletal: Positive for myalgias. Negative for neck pain and neck stiffness.  Skin: Negative for rash and wound.  Neurological: Negative for dizziness, loss of consciousness, syncope, light-headedness and headaches.  Psychiatric/Behavioral: Negative for agitation and confusion.  All other systems reviewed and are negative.   Physical Exam Updated Vital Signs BP (!) 157/94 (BP Location: Right Arm)    Pulse  (!) 104    Temp 98 F (36.7 C) (Oral)    Resp 16    SpO2 100%   Physical Exam Vitals and nursing note reviewed.  Constitutional:      General: He is not in acute distress.    Appearance: He is well-developed. He is not ill-appearing, toxic-appearing or diaphoretic.  HENT:     Head: Normocephalic and atraumatic.     Nose: Nose normal. No congestion or rhinorrhea.     Mouth/Throat:     Mouth: Mucous membranes are dry.     Pharynx: No oropharyngeal exudate or posterior oropharyngeal erythema.  Eyes:     General: Scleral icterus present.     Extraocular Movements: Extraocular movements intact.     Conjunctiva/sclera: Conjunctivae normal.     Pupils: Pupils are equal, round, and reactive to light.  Cardiovascular:     Rate and Rhythm: Normal rate and regular rhythm.     Pulses: Normal pulses.     Heart sounds: No murmur heard.   Pulmonary:     Effort: Pulmonary effort is normal. No respiratory distress.     Breath sounds: Normal breath sounds. No stridor. No wheezing, rhonchi or rales.  Chest:     Chest wall: No tenderness.  Abdominal:     General: Abdomen is flat.     Palpations: Abdomen is soft.     Tenderness: There is no abdominal tenderness. There is no guarding or rebound.  Musculoskeletal:        General: No tenderness.     Cervical back: Neck supple. No tenderness.     Right lower leg: No edema.     Left lower leg: No edema.  Skin:    General: Skin is warm and dry.     Capillary Refill: Capillary refill takes less than 2 seconds.     Coloration: Skin is jaundiced and pale.     Findings: No rash.  Neurological:     General: No focal deficit present.     Mental Status: He is alert.     Sensory: No sensory deficit.     Motor: No weakness.  Psychiatric:        Mood and Affect: Mood normal.     ED Results / Procedures / Treatments   Labs (all labs ordered are listed, but only abnormal results are displayed) Labs Reviewed  CBC WITH DIFFERENTIAL/PLATELET -  Abnormal; Notable for the following components:      Result Value   WBC 3.6 (*)    RBC 2.88 (*)    Hemoglobin 6.5 (*)    HCT 24.2 (*)    MCH 22.6 (*)    MCHC 26.9 (*)    RDW 20.2 (*)    Platelets 80 (*)    Lymphs Abs 0.4 (*)    All other components within normal limits  COMPREHENSIVE METABOLIC PANEL - Abnormal; Notable for the following components:   CO2 19 (*)    Glucose, Bld 126 (*)    Calcium  8.7 (*)    Total Protein 8.9 (*)    Albumin 2.6 (*)    AST 53 (*)    Total Bilirubin 3.6 (*)    All other components within normal limits  PROTIME-INR - Abnormal; Notable for the following components:   Prothrombin Time 17.8 (*)    INR 1.5 (*)    All other components within normal limits  SARS CORONAVIRUS 2 BY RT PCR (HOSPITAL ORDER, PERFORMED IN [facility])  IRON AND TIBC  CK  POC OCCULT BLOOD, ED  TYPE AND SCREEN  ABO/RH  PREPARE RBC (CROSSMATCH)  TROPONIN I (HIGH SENSITIVITY)  TROPONIN I (HIGH SENSITIVITY)    EKG EKG Interpretation  Date/Time:  Monday March 17 2020 13:21:20 EDT Ventricular Rate:  72 PR Interval:    QRS Duration: 94 QT Interval:  411 QTC Calculation: 450 R Axis:   54 Text Interpretation: Sinus rhythm Minimal ST depression, inferior leads When compared to prior, similar apperance. No STEMI Confirmed by Theda Belfast ([id number]) on 03/17/2020 1:40:31 PM   Radiology DG Chest 2 View  Result Date: 03/17/2020 CLINICAL DATA:  Anemia, fatigue, shortness of breath. EXAM: CHEST - 2 VIEW COMPARISON:  None. FINDINGS: The heart size and mediastinal contours are within normal limits. Both lungs are clear. The visualized skeletal structures are unremarkable. IMPRESSION: No active cardiopulmonary disease. Electronically Signed   By: Obie Dredge M.D.   On: 03/17/2020 13:43    Procedures Procedures (including critical care time)  CRITICAL CARE Performed by: Canary Brim Tehran Rabenold Total critical care time: 35 minutes Critical care time was exclusive of  separately billable procedures and treating other patients. Critical care was necessary to treat or prevent imminent or life-threatening deterioration. Critical care was time spent personally by me on the following activities: development of treatment plan with patient and/or surrogate as well as nursing, discussions with consultants, evaluation of patient's response to treatment, examination of patient, obtaining history from patient or surrogate, ordering and performing treatments and interventions, ordering and review of laboratory studies, ordering and review of radiographic studies, pulse oximetry and re-evaluation of patient's condition.   Medications Ordered in ED Medications  0.9 %  sodium chloride infusion (has no administration in time range)    ED Course  I have reviewed the triage vital signs and the nursing notes.  Pertinent labs & imaging results that were available during my care of the patient were reviewed by me and considered in my medical decision making (see chart for details).    MDM Rules/Calculators/A&P                          Wesley Williams is a 49 y.o. male with a past medical history significant for alcohol abuse and hypertension who presents from PCP office for likely symptomatic anemia. Patient reports that he has had cramps for the last week in both of his legs. He is also having occasional exertional chest discomfort and feeling very fatigued. He has not had any syncopal episodes. He went to a PCP clinic yesterday and had blood work which they called him today saying his hemoglobin was 6.2 and he also had low platelets. He needed to come for a transfusion, admission, and further work-up of symptomatic anemia. Patient says that at rest he is feeling well but when he starts to do anything he gets cramps in his legs and gets some mild chest discomfort. He also does feel very tired. He denies any rectal  bleeding or dark tarry stools. He denies any history of GI bleeds. He  does report he drinks moderately but has not had any to drink over the last few days. He had margaritas for 4 July when he reports as last time he drank. He denies any nausea vomiting, or abdominal pain. Denies any urinary symptoms. He denies any history of this.  On exam, lungs are clear and chest is nontender. No murmur appreciated.  patient is slightly jaundiced. Abdomen is nontender. Good pulses in extremities. He does have pale appearing mucous membranes. Rectal exam was performed with no evidence of bright blood. Fecal occult was negative. Exam otherwise unremarkable.  Patient had a repeat hemoglobin compared to yesterday and it is 6.5.  Patient's INR was elevated at 1.5.  CMP shows only slightly elevated AST but bilirubin is elevated at 3.6.  Fecal occult was negative, doubt GI bleed as cause of this.  He is pancytopenic with platelets are improved from yesterday.  Unclear etiology.  Chest x-ray unremarkable and EKG shows no STEMI.  Troponin not elevated.  Suspect symptomatic anemia causing his exertional chest discomfort.  Due to muscle cramps, CK was added on as well.  Patient be given blood for symptomatic anemia will be admitted.   Final Clinical Impression(s) / ED Diagnoses Final diagnoses:  Symptomatic anemia  Pancytopenia (HCC)  Jaundice    Clinical Impression: 1. Symptomatic anemia   2. Pancytopenia (HCC)   3. Jaundice     Disposition: Admit  This note was prepared with assistance of Dragon voice recognition software. Occasional wrong-word or sound-a-like substitutions may have occurred due to the inherent limitations of voice recognition software.      Deriana Vanderhoef, Canary Brim, MD 03/17/20 1445    Vicky Schleich, Canary Brim, MD 03/17/20 1446

## 2020-03-17 NOTE — Telephone Encounter (Signed)
On 03/14/2020, the office tried to contact patient to let him know his lab results were abnormal for anemia. The patient needed to go to the emergency department ASAP. On 03/17/2020, the patient returned call and was advised to go the [facility] ED or [facility] ED now because he has anemia and having blood loss some where. Also, advised the hospital will have to locate where he is bleeding. He will need a blood transfusion and it will get worse if he does not go to the ED now. Patient stated he will go to [facility].

## 2020-03-17 NOTE — Telephone Encounter (Signed)
Pt called back asking if he could go to the [facility]  ED. I told pt what messaged said below that they discussed the [facility] or [facility] long EDs and that pt expressed he would go to [facility] ED. Pt stated that is where he is going.

## 2020-03-17 NOTE — Telephone Encounter (Signed)
Pt called,  I let him know that the messages that were sent in form 03/14/20. Transferred call to [place].

## 2020-03-17 NOTE — ED Triage Notes (Signed)
Pt reports PCP dent him here for anemia. Pt was seen at [facility] clinic on Thursday, called him this am telling him to come here for anemia. Pt denies any concerns, pain or symptoms

## 2020-03-17 NOTE — ED Notes (Signed)
RN attempted report x1.  

## 2020-03-18 ENCOUNTER — Encounter (HOSPITAL_COMMUNITY): Payer: Self-pay | Admitting: Family Medicine

## 2020-03-18 ENCOUNTER — Encounter: Payer: Self-pay | Admitting: Radiology

## 2020-03-18 DIAGNOSIS — D500 Iron deficiency anemia secondary to blood loss (chronic): Secondary | ICD-10-CM

## 2020-03-18 DIAGNOSIS — K7030 Alcoholic cirrhosis of liver without ascites: Secondary | ICD-10-CM | POA: Diagnosis present

## 2020-03-18 LAB — CBC WITH DIFFERENTIAL/PLATELET
Abs Immature Granulocytes: 0.01 10*3/uL (ref 0.00–0.07)
Abs Immature Granulocytes: 0.01 10*3/uL (ref 0.00–0.07)
Basophils Absolute: 0 10*3/uL (ref 0.0–0.1)
Basophils Absolute: 0.1 10*3/uL (ref 0.0–0.1)
Basophils Relative: 1 %
Basophils Relative: 2 %
Eosinophils Absolute: 0.1 10*3/uL (ref 0.0–0.5)
Eosinophils Absolute: 0.1 10*3/uL (ref 0.0–0.5)
Eosinophils Relative: 4 %
Eosinophils Relative: 4 %
HCT: 23.1 % — ABNORMAL LOW (ref 39.0–52.0)
HCT: 30.3 % — ABNORMAL LOW (ref 39.0–52.0)
Hemoglobin: 6.5 g/dL — CL (ref 13.0–17.0)
Hemoglobin: 8.7 g/dL — ABNORMAL LOW (ref 13.0–17.0)
Immature Granulocytes: 0 %
Immature Granulocytes: 0 %
Lymphocytes Relative: 17 %
Lymphocytes Relative: 13 %
Lymphs Abs: 0.4 10*3/uL — ABNORMAL LOW (ref 0.7–4.0)
Lymphs Abs: 0.5 10*3/uL — ABNORMAL LOW (ref 0.7–4.0)
MCH: 23.6 pg — ABNORMAL LOW (ref 26.0–34.0)
MCH: 24.1 pg — ABNORMAL LOW (ref 26.0–34.0)
MCHC: 28.1 g/dL — ABNORMAL LOW (ref 30.0–36.0)
MCHC: 28.7 g/dL — ABNORMAL LOW (ref 30.0–36.0)
MCV: 84 fL (ref 80.0–100.0)
MCV: 83.9 fL (ref 80.0–100.0)
Monocytes Absolute: 0.5 10*3/uL (ref 0.1–1.0)
Monocytes Absolute: 0.6 10*3/uL (ref 0.1–1.0)
Monocytes Relative: 17 %
Monocytes Relative: 18 %
Neutro Abs: 1.6 10*3/uL — ABNORMAL LOW (ref 1.7–7.7)
Neutro Abs: 2.2 10*3/uL (ref 1.7–7.7)
Neutrophils Relative %: 61 %
Neutrophils Relative %: 63 %
Platelets: 60 10*3/uL — ABNORMAL LOW (ref 150–400)
Platelets: 66 10*3/uL — ABNORMAL LOW (ref 150–400)
RBC: 2.75 MIL/uL — ABNORMAL LOW (ref 4.22–5.81)
RBC: 3.61 MIL/uL — ABNORMAL LOW (ref 4.22–5.81)
RDW: 19.6 % — ABNORMAL HIGH (ref 11.5–15.5)
RDW: 18.2 % — ABNORMAL HIGH (ref 11.5–15.5)
WBC: 2.6 10*3/uL — ABNORMAL LOW (ref 4.0–10.5)
WBC: 3.4 10*3/uL — ABNORMAL LOW (ref 4.0–10.5)
nRBC: 0 % (ref 0.0–0.2)
nRBC: 0 % (ref 0.0–0.2)

## 2020-03-18 LAB — BASIC METABOLIC PANEL
Anion gap: 9 (ref 5–15)
BUN: 14 mg/dL (ref 6–20)
CO2: 19 mmol/L — ABNORMAL LOW (ref 22–32)
Calcium: 8.4 mg/dL — ABNORMAL LOW (ref 8.9–10.3)
Chloride: 110 mmol/L (ref 98–111)
Creatinine, Ser: 0.67 mg/dL (ref 0.61–1.24)
GFR calc Af Amer: >60 mL/min (ref >60)
GFR calc non Af Amer: >60 mL/min (ref >60)
Glucose, Bld: 89 mg/dL (ref 70–99)
Potassium: 3.7 mmol/L (ref 3.5–5.1)
Sodium: 138 mmol/L (ref 135–145)

## 2020-03-18 LAB — HEPATITIS PANEL, ACUTE
HCV Ab: NONREACTIVE
Hep A IgM: NONREACTIVE
Hep B C IgM: NONREACTIVE
Hepatitis B Surface Ag: NONREACTIVE

## 2020-03-18 LAB — MAGNESIUM: Magnesium: 1.8 mg/dL (ref 1.7–2.4)

## 2020-03-18 LAB — PROTIME-INR
INR: 1.5 — ABNORMAL HIGH (ref 0.8–1.2)
Prothrombin Time: 17.8 seconds — ABNORMAL HIGH (ref 11.4–15.2)

## 2020-03-18 LAB — HIV ANTIBODY (ROUTINE TESTING W REFLEX): HIV Screen 4th Generation wRfx: NONREACTIVE

## 2020-03-18 LAB — PREPARE RBC (CROSSMATCH)

## 2020-03-18 MED ORDER — PEG-KCL-NACL-NASULF-NA ASC-C 100 G PO SOLR: 0.5000 | Freq: Once | ORAL | Status: DC

## 2020-03-18 MED ORDER — METOCLOPRAMIDE HCL 5 MG/ML IJ SOLN
10.0000 mg | Freq: Once | INTRAMUSCULAR | Status: AC
  Administered 2020-03-18: 10 mg via INTRAVENOUS
  Filled 2020-03-18: qty 2

## 2020-03-18 MED ORDER — BISACODYL 5 MG PO TBEC: 20.0000 mg | DELAYED_RELEASE_TABLET | Freq: Once | ORAL | Status: DC

## 2020-03-18 MED ORDER — METOCLOPRAMIDE HCL 5 MG/ML IJ SOLN
10.0000 mg | Freq: Once | INTRAMUSCULAR | Status: AC
  Administered 2020-03-19: 10 mg via INTRAVENOUS
  Filled 2020-03-18: qty 2

## 2020-03-18 MED ORDER — PEG-KCL-NACL-NASULF-NA ASC-C 100 G PO SOLR
0.5000 | Freq: Once | ORAL | Status: AC
  Administered 2020-03-18: 100 g via ORAL
  Filled 2020-03-18: qty 1

## 2020-03-18 MED ORDER — PEG-KCL-NACL-NASULF-NA ASC-C 100 G PO SOLR
0.5000 | Freq: Once | ORAL | Status: AC
  Administered 2020-03-19: 100 g via ORAL

## 2020-03-18 MED ORDER — SODIUM CHLORIDE 0.9% IV SOLUTION: Freq: Once | INTRAVENOUS | Status: AC

## 2020-03-18 NOTE — Consult Note (Addendum)
[facility] Consult: 5:35 PM 03/18/2020  LOS: 0 days    Referring Provider: Dr Nita Sells, resident  Primary Care Physician:  Horald Pollen, MD Primary Gastroenterologist:  unassigned    Reason for Consultation:  Anemia.  New cirrhosis.     HPI: Wesley Williams is a 49 y.o. male.  PMH Htn, not taking meds recently.  Anemia, Hgb 10.2 in 04/2016, 7.8/MCV 80 in 11/2017, 8.8 at fup 12/2016 but PMD did not investigate or refer to specialist, then pt lost to fup until last week .  ETOH abuse w symptomatic withdrawal during admission w dehydration, AKI, rhabdo 04/2016.  Leg cramps.  Rarely follows up w PMD   Seen in ED for suturing and suture removal of chain saw related lac to knee 5 weeks ago.  Some bleeding at time, not excessive, resolved after suturing.    Seen re leg cramps and abd bloating, cramping at PMD office 6/8. Rxd w Robaxin, which helped   PMD called pt yesterday and sent pt to ED for blood transfusion, Hgb 6.2.  Platelets 30 K on 7/8.  MCV 78.  Getting 2nd of 3 PRBCs now. platelets to 60, WBCs 2.6 today.   INR 1.5.   Ferritin 10, Iron 21.   t bili 3.7.  Alk phos 118.  AST/ALT 54/18. Acute Hepatitis serologies negative. Na and renal function wnl.    FOBT negative.    CTAP w contrast: Cirrhosis, portosystemic venous collaterals in the gastrohepatic and gastrosplenic ligaments and mild esophageal varices, c/w portal venous htn. Gallstones.  Aortic atherosclerosis.  No ascites.      Good appetite.  No nausea vomiting.  No increased abdominal girth.  No lower extremity edema.  No excessive or unusual bleeding or bruising.  No seizures, no dizziness, no syncope.  No shortness of breath, no weakness.  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  Reports drinking beer in AM when he sustained  chain saw injury last month.  Currently admits to drinking 2 beers at lunch and a sixpack when he gets home from work in the evening.  Lives with his girlfriend/partner of 92 years and her 44 and stepson in [place] history Mother died from complication of diabetes in her 65s.  No family history of alcoholism or liver disease.  No family history of gastrointestinal cancers, anemia.       Past Medical History:  Diagnosis Date  . Alcohol abuse   . Cirrhosis ([place])   . Hypertension     History reviewed. No pertinent surgical history.  Prior to Admission medications   Medication Sig Start Date End Date Taking? Authorizing Provider  diclofenac (VOLTAREN) 75 MG EC tablet Take 1 tablet (75 mg total) by mouth 2 (two) times daily. 03/13/20  Yes Posey Boyer, MD  methocarbamol (ROBAXIN) 500 MG tablet Take 1 in the morning, 1 in the afternoon, and 1 or 2 at bedtime for muscle relaxant as needed. 03/13/20  Yes Posey Boyer, MD  pantoprazole (PROTONIX) 20 MG tablet Take 1 tablet (  20 mg total) by mouth daily. Patient taking differently: Take 20 mg by mouth daily as needed for heartburn or indigestion.  03/13/20  Yes Posey Boyer, MD  amLODipine (NORVASC) 2.5 MG tablet Take 1 tablet (2.5 mg total) by mouth daily. Patient not taking: Reported on 03/17/2020 06/18/16   Clayton Bibles, PA-C  metoprolol succinate (TOPROL XL) 25 MG 24 hr tablet Take 1 tablet (25 mg total) by mouth daily. Patient not taking: Reported on 03/17/2020 06/18/16   Clayton Bibles, PA-C  tamsulosin (FLOMAX) 0.4 MG CAPS capsule Take 1 capsule (0.4 mg total) by mouth daily. Patient not taking: Reported on 03/17/2020 06/18/16   Clayton Bibles, PA-C   Social History   Tobacco Use  . Smoking status: Never Smoker  . Smokeless tobacco: Never Used  Vaping Use  . Vaping Use: Never used  Substance Use Topics  . Alcohol use: Yes    Comment: daily  . Drug use: No   Works for tree [employer]  Scheduled Meds: . folic acid   1 mg Oral Daily  . metoCLOPramide (REGLAN) injection  10 mg Intravenous Once  . [START ON 03/19/2020] metoCLOPramide (REGLAN) injection  10 mg Intravenous Once  . metoprolol succinate  25 mg Oral Daily  . multivitamin with minerals  1 tablet Oral Daily  . pantoprazole  20 mg Oral Daily  . peg 3350 powder  0.5 kit Oral Once  . [START ON 03/19/2020] peg 3350 powder  0.5 kit Oral Once  . tamsulosin  0.4 mg Oral Daily  . thiamine  100 mg Oral Daily   Infusions:  PRN Meds:    Allergies as of 03/17/2020  . (No Known Allergies)        REVIEW OF SYSTEMS: Constitutional: Per HPI. ENT:  No nose bleeds Pulm: No shortness of breath, no cough CV:  No palpitations, no LE edema.  No angina GU:  No hematuria, no frequency GI: No dysphagia.  Good appetite.  Stable weight.  No black or bloody stools. Heme: Per HPI. Transfusions: Never transfused before.  Currently finishing up second PRBC. Neuro:  No headaches, no peripheral tingling or numbness.  No dizziness.  No syncope. Derm:  No itching, no rash or sores.  Right knee laceration site healing nicely. Endocrine:  No sweats or chills.  No polyuria or dysuria Immunization: Has not been vaccinated for Covid, received Tdap vaccination on 02/12/2020 Travel:  None beyond local counties in last few months.    PHYSICAL EXAM: Vital signs in last 24 hours: Vitals:   03/18/20 1430 03/18/20 1630  BP: (!) 159/85 (!) 159/84  Pulse: 65 65  Resp: 18 18  Temp: 98.4 F (36.9 C) 98.4 F (36.9 C)  SpO2:  100%   Wt Readings from Last 3 Encounters:  03/18/20 86 kg  03/13/20 90.4 kg  02/24/20 92 kg    General: Slightly jaundiced, comfortable, alert, not particularly ill-appearing. Head: No facial asymmetry or swelling.  No signs of head trauma. Eyes: Slightly icteric sclera.  No conjunctival pallor.  EOMI. Ears: Good hearing Nose: No congestion or discharge Mouth: Oropharynx moist, pink, clear.  Tongue midline.  Good dentition. Neck: No JVD, no  masses, no thyromegaly Lungs: Clear bilaterally.  No labored breathing or cough Heart: RRR.  No MRG.  S1, S2 present Abdomen: Soft, nontender, nondistended.  Active bowel sounds.  Liver margin palpable just below costovertebral angle..   Rectal: Deferred Musc/Skeltl: Laceration scar healing nicely on right knee. Extremities: No CCE.  Feet are warm.  Good reperfusion in toes and fingers. Neurologic: No tremors.  No asterixis.  Moves all 4 limbs without weakness.  Alert and oriented x3.  Good historian. Skin: Jaundice not appreciated but he is quite tanned and has a olive complexion which might mask subtle jaundice. Tattoos: None observed Nodes: No cervical adenopathy. Psych: Calm, cooperative, pleasant.  Intake/Output from previous day: 07/12 0701 - 07/13 0700 In: 345 [I.V.:30; Blood:315] Out: -  Intake/Output this shift: Total I/O In: 1227.5 [P.O.:820; I.V.:40; Blood:367.5] Out: -   LAB RESULTS: Recent Labs    03/17/20 1157 03/18/20 0240  WBC 3.6* 2.6*  HGB 6.5* 6.5*  HCT 24.2* 23.1*  PLT 80* 60*   BMET Lab Results  Component Value Date   NA 138 03/18/2020   NA 137 03/17/2020   NA 135 03/13/2020   K 3.7 03/18/2020   K 3.5 03/17/2020   K 4.6 03/13/2020   CL 110 03/18/2020   CL 107 03/17/2020   CL 100 03/13/2020   CO2 19 (L) 03/18/2020   CO2 19 (L) 03/17/2020   CO2 22 03/13/2020   GLUCOSE 89 03/18/2020   GLUCOSE 126 (H) 03/17/2020   GLUCOSE 118 (H) 03/13/2020   BUN 14 03/18/2020   BUN 16 03/17/2020   BUN 12 03/13/2020   CREATININE 0.67 03/18/2020   CREATININE 1.09 03/17/2020   CREATININE 0.65 (L) 03/13/2020   CALCIUM 8.4 (L) 03/18/2020   CALCIUM 8.7 (L) 03/17/2020   CALCIUM 8.7 03/13/2020   LFT Recent Labs    03/17/20 1157 03/17/20 1516  PROT 8.9* 8.7*  ALBUMIN 2.6* 2.5*  AST 53* 45*  ALT 22 23  ALKPHOS 88 83  BILITOT 3.6* 3.0*  BILIDIR  --  1.4*  IBILI  --  1.6*   PT/INR Lab Results  Component Value Date   INR 1.5 (H) 03/18/2020   INR 1.5  (H) 03/17/2020   Hepatitis Panel Recent Labs    03/17/20 2241  HEPBSAG NON REACTIVE  HCVAB NON REACTIVE  HEPAIGM NON REACTIVE  HEPBIGM NON REACTIVE   C-Diff No components found for: CDIFF Lipase  No results found for: LIPASE  Drugs of Abuse     Component Value Date/Time   LABOPIA NONE DETECTED 03/17/2020 1955   COCAINSCRNUR NONE DETECTED 03/17/2020 1955   LABBENZ NONE DETECTED 03/17/2020 1955   AMPHETMU NONE DETECTED 03/17/2020 1955   THCU NONE DETECTED 03/17/2020 1955   LABBARB NONE DETECTED 03/17/2020 1955     RADIOLOGY STUDIES: CT ABDOMEN PELVIS W WO CONTRAST  Result Date: 03/17/2020 CLINICAL DATA:  Jaundice.  Fatigue.  Decreased hemoglobin. EXAM: CT ABDOMEN AND PELVIS WITHOUT AND WITH CONTRAST TECHNIQUE: Multidetector CT imaging of the abdomen and pelvis was performed following the standard protocol before and following the bolus administration of intravenous contrast. CONTRAST:  133m OMNIPAQUE IOHEXOL 300 MG/ML  SOLN COMPARISON:  None. FINDINGS: Lower Chest: No acute findings. Hepatobiliary: Advanced hepatic cirrhosis is demonstrated. No hepatic masses are identified. Portal and hepatic veins are patent. Recanalization of paraumbilical veins is consistent with portal venous hypertension. Tiny calcified gallstones are noted, however there is no evidence of acute cholecystitis or biliary ductal dilatation. Pancreas:  No mass or inflammatory changes. Spleen: Mild-to-moderate splenomegaly, consistent with portal venous hypertension. No splenic masses identified. Adrenals/Urinary Tract: No masses identified. No evidence of ureteral calculi or hydronephrosis. Stomach/Bowel: No evidence of obstruction, inflammatory process or abnormal fluid collections. Normal appendix visualized. Vascular/Lymphatic: No pathologically enlarged lymph nodes. No abdominal aortic aneurysm. Aortic atherosclerosis noted. Reproductive:  No mass or other  significant abnormality. Other: Portosystemic venous  collaterals in the gastrohepatic and gastrosplenic ligaments are seen as well as mild esophageal varices, consistent with portal venous hypertension. No evidence of ascites. Musculoskeletal:  No suspicious bone lesions identified. IMPRESSION: Hepatic cirrhosis and findings of portal venous hypertension. No radiographic evidence of neoplasm or biliary obstruction. Cholelithiasis. No radiographic evidence of cholecystitis. Aortic Atherosclerosis (ICD10-I70.0). Electronically Signed   By: Marlaine Hind M.D.   On: 03/17/2020 19:54   DG Chest 2 View  Result Date: 03/17/2020 CLINICAL DATA:  Anemia, fatigue, shortness of breath. EXAM: CHEST - 2 VIEW COMPARISON:  None. FINDINGS: The heart size and mediastinal contours are within normal limits. Both lungs are clear. The visualized skeletal structures are unremarkable. IMPRESSION: No active cardiopulmonary disease. Electronically Signed   By: Titus Dubin M.D.   On: 03/17/2020 13:43     IMPRESSION:   *    Iron def anemia.  FOBT negative on 1 of 1 assays.  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  off   PLAN:     *    Patient understands that he needs total abstinence from alcohol, in his case beer, for the rest of his life.  Pt is aware of this, has been reading about cirrhosis on his phone.    *    Advised resident to hold off on transfusing 3rd unit of blood.  *   EGD and colonoscopy, Thursday.  Start clears tmrw and split prep tmrw PM.  Watch for ETOH withdrawal, currently asymmptomatic from that standpoint.   Needs variceal screening, screening colonoscopy in 49 y/o as well as diagnostic studies for investigation as to potential GI sources of anemia  *   CBC in AM and after transfusion tonight.     Azucena Freed, PA-C  03/18/2020, 5:35 PM Phone [phone]    [facility] Attending   I have taken an interval history, reviewed the chart and  examined the patient. I agree with the Advanced Practitioner's note, impression and recommendations.     After I talked with him sounds like minimal EtOH in past week so I am thinking w/drawl unlikely so will try to do egd and colonoscopy tomorrow PM   Orders written  The risks and benefits as well as alternatives of endoscopic procedure(s) have been discussed and reviewed. All questions answered. The patient agrees to proceed.   This must be EtOH in origin but ? If other underlying conditions so will do a few more labs - alpha-1-antitrypsin, ANA, anti-smooth mm and mitochoncrial Abs to be more certain  [name], MD, [facility] 03/18/2020 5:38 PM

## 2020-03-18 NOTE — Progress Notes (Signed)
This nurse called back to ER to get report on patient coning to room 16. Received report from [facility] Cochrane, RN. 03/17/2020 @ 2040 Manson Allan, RN

## 2020-03-18 NOTE — H&P (View-Only) (Signed)
[facility] Consult: 5:35 PM 03/18/2020  LOS: 0 days    Referring Provider: Dr Nita Sells, resident  Primary Care Physician:  Horald Pollen, MD Primary Gastroenterologist:  unassigned    Reason for Consultation:  Anemia.  New cirrhosis.     HPI: Wesley Williams is a 49 y.o. male.  PMH Htn, not taking meds recently.  Anemia, Hgb 10.2 in 04/2016, 7.8/MCV 80 in 11/2017, 8.8 at fup 12/2016 but PMD did not investigate or refer to specialist, then pt lost to fup until last week .  ETOH abuse w symptomatic withdrawal during admission w dehydration, AKI, rhabdo 04/2016.  Leg cramps.  Rarely follows up w PMD   Seen in ED for suturing and suture removal of chain saw related lac to knee 5 weeks ago.  Some bleeding at time, not excessive, resolved after suturing.    Seen re leg cramps and abd bloating, cramping at PMD office 6/8. Rxd w Robaxin, which helped   PMD called pt yesterday and sent pt to ED for blood transfusion, Hgb 6.2.  Platelets 30 K on 7/8.  MCV 78.  Getting 2nd of 3 PRBCs now. platelets to 60, WBCs 2.6 today.   INR 1.5.   Ferritin 10, Iron 21.   t bili 3.7.  Alk phos 118.  AST/ALT 54/18. Acute Hepatitis serologies negative. Na and renal function wnl.    FOBT negative.    CTAP w contrast: Cirrhosis, portosystemic venous collaterals in the gastrohepatic and gastrosplenic ligaments and mild esophageal varices, c/w portal venous htn. Gallstones.  Aortic atherosclerosis.  No ascites.      Good appetite.  No nausea vomiting.  No increased abdominal girth.  No lower extremity edema.  No excessive or unusual bleeding or bruising.  No seizures, no dizziness, no syncope.  No shortness of breath, no weakness.  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  Reports drinking beer in AM when he sustained  chain saw injury last month.  Currently admits to drinking 2 beers at lunch and a sixpack when he gets home from work in the evening.  Lives with his girlfriend/partner of 75 years and her 67 and stepson in [place] history Mother died from complication of diabetes in her 17s.  No family history of alcoholism or liver disease.  No family history of gastrointestinal cancers, anemia.       Past Medical History:  Diagnosis Date   Alcohol abuse    Cirrhosis ([place])    Hypertension     History reviewed. No pertinent surgical history.  Prior to Admission medications   Medication Sig Start Date End Date Taking? Authorizing Provider  diclofenac (VOLTAREN) 75 MG EC tablet Take 1 tablet (75 mg total) by mouth 2 (two) times daily. 03/13/20  Yes Posey Boyer, MD  methocarbamol (ROBAXIN) 500 MG tablet Take 1 in the morning, 1 in the afternoon, and 1 or 2 at bedtime for muscle relaxant as needed. 03/13/20  Yes Posey Boyer, MD  pantoprazole (PROTONIX) 20 MG tablet Take 1 tablet (  20 mg total) by mouth daily. Patient taking differently: Take 20 mg by mouth daily as needed for heartburn or indigestion.  03/13/20  Yes Posey Boyer, MD  amLODipine (NORVASC) 2.5 MG tablet Take 1 tablet (2.5 mg total) by mouth daily. Patient not taking: Reported on 03/17/2020 06/18/16   Clayton Bibles, PA-C  metoprolol succinate (TOPROL XL) 25 MG 24 hr tablet Take 1 tablet (25 mg total) by mouth daily. Patient not taking: Reported on 03/17/2020 06/18/16   Clayton Bibles, PA-C  tamsulosin (FLOMAX) 0.4 MG CAPS capsule Take 1 capsule (0.4 mg total) by mouth daily. Patient not taking: Reported on 03/17/2020 06/18/16   Clayton Bibles, PA-C   Social History   Tobacco Use   Smoking status: Never Smoker   Smokeless tobacco: Never Used  Vaping Use   Vaping Use: Never used  Substance Use Topics   Alcohol use: Yes    Comment: daily   Drug use: No   Works for tree service  Scheduled Meds:  folic acid   1 mg Oral Daily   metoCLOPramide (REGLAN) injection  10 mg Intravenous Once   [START ON 03/19/2020] metoCLOPramide (REGLAN) injection  10 mg Intravenous Once   metoprolol succinate  25 mg Oral Daily   multivitamin with minerals  1 tablet Oral Daily   pantoprazole  20 mg Oral Daily   peg 3350 powder  0.5 kit Oral Once   [START ON 03/19/2020] peg 3350 powder  0.5 kit Oral Once   tamsulosin  0.4 mg Oral Daily   thiamine  100 mg Oral Daily   Infusions:  PRN Meds:    Allergies as of 03/17/2020   (No Known Allergies)        REVIEW OF SYSTEMS: Constitutional: Per HPI. ENT:  No nose bleeds Pulm: No shortness of breath, no cough CV:  No palpitations, no LE edema.  No angina GU:  No hematuria, no frequency GI: No dysphagia.  Good appetite.  Stable weight.  No black or bloody stools. Heme: Per HPI. Transfusions: Never transfused before.  Currently finishing up second PRBC. Neuro:  No headaches, no peripheral tingling or numbness.  No dizziness.  No syncope. Derm:  No itching, no rash or sores.  Right knee laceration site healing nicely. Endocrine:  No sweats or chills.  No polyuria or dysuria Immunization: Has not been vaccinated for Covid, received Tdap vaccination on 02/12/2020 Travel:  None beyond local counties in last few months.    PHYSICAL EXAM: Vital signs in last 24 hours: Vitals:   03/18/20 1430 03/18/20 1630  BP: (!) 159/85 (!) 159/84  Pulse: 65 65  Resp: 18 18  Temp: 98.4 F (36.9 C) 98.4 F (36.9 C)  SpO2:  100%   Wt Readings from Last 3 Encounters:  03/18/20 86 kg  03/13/20 90.4 kg  02/24/20 92 kg    General: Slightly jaundiced, comfortable, alert, not particularly ill-appearing. Head: No facial asymmetry or swelling.  No signs of head trauma. Eyes: Slightly icteric sclera.  No conjunctival pallor.  EOMI. Ears: Good hearing Nose: No congestion or discharge Mouth: Oropharynx moist, pink, clear.  Tongue midline.  Good dentition. Neck: No JVD, no  masses, no thyromegaly Lungs: Clear bilaterally.  No labored breathing or cough Heart: RRR.  No MRG.  S1, S2 present Abdomen: Soft, nontender, nondistended.  Active bowel sounds.  Liver margin palpable just below costovertebral angle..   Rectal: Deferred Musc/Skeltl: Laceration scar healing nicely on right knee. Extremities: No CCE.  Feet are warm.  Good reperfusion in toes and fingers. Neurologic: No tremors.  No asterixis.  Moves all 4 limbs without weakness.  Alert and oriented x3.  Good historian. Skin: Jaundice not appreciated but he is quite tanned and has a olive complexion which might mask subtle jaundice. Tattoos: None observed Nodes: No cervical adenopathy. Psych: Calm, cooperative, pleasant.  Intake/Output from previous day: 07/12 0701 - 07/13 0700 In: 345 [I.V.:30; Blood:315] Out: -  Intake/Output this shift: Total I/O In: 1227.5 [P.O.:820; I.V.:40; Blood:367.5] Out: -   LAB RESULTS: Recent Labs    03/17/20 1157 03/18/20 0240  WBC 3.6* 2.6*  HGB 6.5* 6.5*  HCT 24.2* 23.1*  PLT 80* 60*   BMET Lab Results  Component Value Date   NA 138 03/18/2020   NA 137 03/17/2020   NA 135 03/13/2020   K 3.7 03/18/2020   K 3.5 03/17/2020   K 4.6 03/13/2020   CL 110 03/18/2020   CL 107 03/17/2020   CL 100 03/13/2020   CO2 19 (L) 03/18/2020   CO2 19 (L) 03/17/2020   CO2 22 03/13/2020   GLUCOSE 89 03/18/2020   GLUCOSE 126 (H) 03/17/2020   GLUCOSE 118 (H) 03/13/2020   BUN 14 03/18/2020   BUN 16 03/17/2020   BUN 12 03/13/2020   CREATININE 0.67 03/18/2020   CREATININE 1.09 03/17/2020   CREATININE 0.65 (L) 03/13/2020   CALCIUM 8.4 (L) 03/18/2020   CALCIUM 8.7 (L) 03/17/2020   CALCIUM 8.7 03/13/2020   LFT Recent Labs    03/17/20 1157 03/17/20 1516  PROT 8.9* 8.7*  ALBUMIN 2.6* 2.5*  AST 53* 45*  ALT 22 23  ALKPHOS 88 83  BILITOT 3.6* 3.0*  BILIDIR  --  1.4*  IBILI  --  1.6*   PT/INR Lab Results  Component Value Date   INR 1.5 (H) 03/18/2020   INR 1.5  (H) 03/17/2020   Hepatitis Panel Recent Labs    03/17/20 2241  HEPBSAG NON REACTIVE  HCVAB NON REACTIVE  HEPAIGM NON REACTIVE  HEPBIGM NON REACTIVE   C-Diff No components found for: CDIFF Lipase  No results found for: LIPASE  Drugs of Abuse     Component Value Date/Time   LABOPIA NONE DETECTED 03/17/2020 1955   COCAINSCRNUR NONE DETECTED 03/17/2020 1955   LABBENZ NONE DETECTED 03/17/2020 1955   AMPHETMU NONE DETECTED 03/17/2020 1955   THCU NONE DETECTED 03/17/2020 1955   LABBARB NONE DETECTED 03/17/2020 1955     RADIOLOGY STUDIES: CT ABDOMEN PELVIS W WO CONTRAST  Result Date: 03/17/2020 CLINICAL DATA:  Jaundice.  Fatigue.  Decreased hemoglobin. EXAM: CT ABDOMEN AND PELVIS WITHOUT AND WITH CONTRAST TECHNIQUE: Multidetector CT imaging of the abdomen and pelvis was performed following the standard protocol before and following the bolus administration of intravenous contrast. CONTRAST:  175m OMNIPAQUE IOHEXOL 300 MG/ML  SOLN COMPARISON:  None. FINDINGS: Lower Chest: No acute findings. Hepatobiliary: Advanced hepatic cirrhosis is demonstrated. No hepatic masses are identified. Portal and hepatic veins are patent. Recanalization of paraumbilical veins is consistent with portal venous hypertension. Tiny calcified gallstones are noted, however there is no evidence of acute cholecystitis or biliary ductal dilatation. Pancreas:  No mass or inflammatory changes. Spleen: Mild-to-moderate splenomegaly, consistent with portal venous hypertension. No splenic masses identified. Adrenals/Urinary Tract: No masses identified. No evidence of ureteral calculi or hydronephrosis. Stomach/Bowel: No evidence of obstruction, inflammatory process or abnormal fluid collections. Normal appendix visualized. Vascular/Lymphatic: No pathologically enlarged lymph nodes. No abdominal aortic aneurysm. Aortic atherosclerosis noted. Reproductive:  No mass or other  significant abnormality. Other: Portosystemic venous  collaterals in the gastrohepatic and gastrosplenic ligaments are seen as well as mild esophageal varices, consistent with portal venous hypertension. No evidence of ascites. Musculoskeletal:  No suspicious bone lesions identified. IMPRESSION: Hepatic cirrhosis and findings of portal venous hypertension. No radiographic evidence of neoplasm or biliary obstruction. Cholelithiasis. No radiographic evidence of cholecystitis. Aortic Atherosclerosis (ICD10-I70.0). Electronically Signed   By: Marlaine Hind M.D.   On: 03/17/2020 19:54   DG Chest 2 View  Result Date: 03/17/2020 CLINICAL DATA:  Anemia, fatigue, shortness of breath. EXAM: CHEST - 2 VIEW COMPARISON:  None. FINDINGS: The heart size and mediastinal contours are within normal limits. Both lungs are clear. The visualized skeletal structures are unremarkable. IMPRESSION: No active cardiopulmonary disease. Electronically Signed   By: Titus Dubin M.D.   On: 03/17/2020 13:43     IMPRESSION:   *    Iron def anemia.  FOBT negative on 1 of 1 assays.  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  off   PLAN:     *    Patient understands that he needs total abstinence from alcohol, in his case beer, for the rest of his life.  Pt is aware of this, has been reading about cirrhosis on his phone.    *    Advised resident to hold off on transfusing 3rd unit of blood.  *   EGD and colonoscopy, Thursday.  Start clears tmrw and split prep tmrw PM.  Watch for ETOH withdrawal, currently asymmptomatic from that standpoint.   Needs variceal screening, screening colonoscopy in 49 y/o as well as diagnostic studies for investigation as to potential GI sources of anemia  *   CBC in AM and after transfusion tonight.     Azucena Freed, PA-C  03/18/2020, 5:35 PM Phone [phone]    [facility] Attending   I have taken an interval history, reviewed the chart and  examined the patient. I agree with the Advanced Practitioner's note, impression and recommendations.     After I talked with him sounds like minimal EtOH in past week so I am thinking w/drawl unlikely so will try to do egd and colonoscopy tomorrow PM   Orders written  The risks and benefits as well as alternatives of endoscopic procedure(s) have been discussed and reviewed. All questions answered. The patient agrees to proceed.   This must be EtOH in origin but ? If other underlying conditions so will do a few more labs - alpha-1-antitrypsin, ANA, anti-smooth mm and mitochoncrial Abs to be more certain  [name], MD, [facility] 03/18/2020 5:38 PM

## 2020-03-18 NOTE — Progress Notes (Signed)
Family Medicine Teaching Service Daily Progress Note Intern Pager: [phone]  Patient name: Wesley Williams Medical record number: [id number] Date of birth: 05-08-1971 Age: 49 y.o. Gender: male  Primary Care Provider: Horald Pollen, MD Consultants: None Code Status: FULL  Pt Overview and Major Events to Date:  Admitted 7/12  Assessment and Plan: Wesley Williams is a 49 y.o. male presenting with 1 to 2-week history of chest discomfort and bilateral leg cramping. PMH is significant for rhabdomyolysis, alcohol abuse and withdrawal, hypertension, AKI, dysuria, erectile dysfunction.  Anemia of Chronic Disease Admission hemoglobin 6.2.  One unit of blood ordered for transfusion. Repeat H&H notable for 6.5, two more units ordered. Awaiting transfusions and repeat H&H. FOBT negative. UA negative. CT shows small varices. Based on historical PCP visit notes, this appears to be a longstanding issue with anemia of chronic disease. Magnesium 1.8 INR 1.5, PT 17.8. Hep A and B non-reactive, HIV non-reactive -3 total units of blood have been ordered thus far -Follow-up posttransfusion H&H - offer Hep A/Hep B/PNA vaccination   - Consult GI, appreciate recommendations - Morning BMP and CBC - CIWA daily - iron supplementation on discharge  Thrombocytopenia  Leukopenia  Elevated total protein Platelets 60, WBC 2.6, total protein 8.7, albumin 2.5, total bilirubin 3. PT 17.8, INR 1.5. -AM CBC -Consider hematology consult    Elevated AST AST 53 > 45, ALT 22 > 23, alk phos 88 > 83. Likely contributory from longstanding alcoholism.   Hx of rhabdomyolysis Patient present with chief complaint of leg cramps. Episode in August 2017: CK 1232 > 1119 > 800 > 429.  This admission, CK 111. -Consider the need for IVF   HTN No established diagnosis of hypertension, however is on home meds of amlodipine 2.5 daily, metoprolol XL 25 mg daily.  On admission, is hypertensive.  Systolics 712-458, diastolic  09-98.  Pulse 66-1 04. -Continue to monitor vitals -Continue metoprolol 25 mg daily -Hold amlodipine 2.5 daily at this time, consider adding on in future  Hx alcoholism Hepatic cirrhosis and findings of portal venous hypertension. History of alcoholism, however says that he has cut down on his drinking.  Drinking of choice is Bud Light.  Last drink on July fourth. Denies ever having shakes or seizures from alcohol withdrawal. AST 45, ALT 23, alk phos 83. -CIWA daily -Thiamine 100 mg daily, multivitamin daily, folic acid 1 mg daily -Continue to monitor vitals  Hx UTI  Intermittent suprapubic pain Patient endorses intermittent suprapubic pain with urination for the past month or so.  History of UTI.  Home med of tamsulosin 0.4 mg daily although no diagnosis of BPH. -Follow-up urinalysis -Follow-up urine culture -Tamsulosin 0.4 mg daily  GERD On Protonix 20 mg daily at home. -Continue Protonix  FEN/GI: Regular diet Prophylaxis: SCDs   Disposition: Continue to monitor inpatient  Subjective:  Patient states he feels fine and would like to go home today. He notes that he has never been symptomatic, he only came to the ED at the request of his PCP after his blood work results. He tells me that he has not had a drink since the 4th of July but previously would drink 2 beers in the morning, 2 at lunch time and a 6-pack in the evenings. He has done that for many years but also notes he was able to get sober for a little. No history of prior withdrawal seizure. He has no chest pain, shortness of breath or other symptoms.   Objective: Temp:  [  98 F (36.7 C)-98.4 F (36.9 C)] 98.3 F (36.8 C) (07/13 0602) Pulse Rate:  [60-104] 60 (07/13 0602) Resp:  [12-23] 15 (07/13 0602) BP: (123-184)/(73-97) 123/82 (07/13 0602) SpO2:  [99 %-100 %] 99 % (07/13 0602) Weight:  [86 kg] 86 kg (07/13 0500) Physical Exam: General: No acute distress, awake, alert, sitting up in bed. No jaundice  noted HEENT: + scleral icterus Cardiovascular: RRR, no murmurs Respiratory: CTAB Abdomen: Obese, no fluid wave. Mild tenderness to palpation RUQ, able to palpate liver edge Extremities: 2+ DP, radial  Laboratory: Recent Labs  Lab 03/13/20 1603 03/17/20 1157 03/18/20 0240  WBC 4.0 3.6* 2.6*  HGB 6.2* 6.5* 6.5*  HCT 21.2* 24.2* 23.1*  PLT 30* 80* 60*   Recent Labs  Lab 03/13/20 1603 03/17/20 1157 03/17/20 1516 03/18/20 0240  NA 135 137  --  138  K 4.6 3.5  --  3.7  CL 100 107  --  110  CO2 22 19*  --  19*  BUN 12 16  --  14  CREATININE 0.65* 1.09  --  0.67  CALCIUM 8.7 8.7*  --  8.4*  PROT 9.0* 8.9* 8.7*  --   BILITOT 3.7* 3.6* 3.0*  --   ALKPHOS 118 88 83  --   ALT _0 --   AST 54* 53* 45*  --   GLUCOSE 118* 126*  --  89    Imaging/Diagnostic Tests: CT Abd/Pelv 7/13 IMPRESSION: Hepatic cirrhosis and findings of portal venous hypertension. No radiographic evidence of neoplasm or biliary obstruction. Cholelithiasis. No radiographic evidence of cholecystitis. Aortic Atherosclerosis (ICD10-I70.0).  Sharion Settler, DO 03/18/2020, 6:47 AM PGY-1, [facility] Intern pager: [phone], text pages welcome

## 2020-03-18 NOTE — Plan of Care (Signed)
  Problem: Education: Goal: Knowledge of General Education information will improve Description Including pain rating scale, medication(s)/side effects and non-pharmacologic comfort measures Outcome: Progressing   

## 2020-03-19 ENCOUNTER — Encounter (HOSPITAL_COMMUNITY): Payer: Self-pay | Admitting: Family Medicine

## 2020-03-19 ENCOUNTER — Inpatient Hospital Stay (HOSPITAL_COMMUNITY): Payer: Self-pay | Admitting: Anesthesiology

## 2020-03-19 ENCOUNTER — Encounter (HOSPITAL_COMMUNITY)
Admission: EM | Disposition: A | Discharge status: Home / Self Care | Payer: Self-pay | Source: Home / Self Care | Attending: Family Medicine

## 2020-03-19 DIAGNOSIS — I8510 Secondary esophageal varices without bleeding: Secondary | ICD-10-CM | POA: Diagnosis present

## 2020-03-19 DIAGNOSIS — K766 Portal hypertension: Secondary | ICD-10-CM | POA: Diagnosis present

## 2020-03-19 DIAGNOSIS — K648 Other hemorrhoids: Secondary | ICD-10-CM

## 2020-03-19 DIAGNOSIS — K7460 Unspecified cirrhosis of liver: Secondary | ICD-10-CM

## 2020-03-19 DIAGNOSIS — K649 Unspecified hemorrhoids: Secondary | ICD-10-CM | POA: Diagnosis present

## 2020-03-19 DIAGNOSIS — I8511 Secondary esophageal varices with bleeding: Secondary | ICD-10-CM

## 2020-03-19 DIAGNOSIS — K922 Gastrointestinal hemorrhage, unspecified: Secondary | ICD-10-CM

## 2020-03-19 HISTORY — PX: ESOPHAGOGASTRODUODENOSCOPY (EGD) WITH PROPOFOL: SHX5813

## 2020-03-19 HISTORY — PX: COLONOSCOPY WITH PROPOFOL: SHX5780

## 2020-03-19 HISTORY — PX: ESOPHAGEAL BANDING: SHX5518

## 2020-03-19 LAB — BASIC METABOLIC PANEL
Anion gap: 8 (ref 5–15)
BUN: 11 mg/dL (ref 6–20)
CO2: 18 mmol/L — ABNORMAL LOW (ref 22–32)
Calcium: 8.3 mg/dL — ABNORMAL LOW (ref 8.9–10.3)
Chloride: 112 mmol/L — ABNORMAL HIGH (ref 98–111)
Creatinine, Ser: 0.66 mg/dL (ref 0.61–1.24)
GFR calc Af Amer: >60 mL/min (ref >60)
GFR calc non Af Amer: >60 mL/min (ref >60)
Glucose, Bld: 90 mg/dL (ref 70–99)
Potassium: 3.8 mmol/L (ref 3.5–5.1)
Sodium: 138 mmol/L (ref 135–145)

## 2020-03-19 LAB — TYPE AND SCREEN
ABO/RH(D): A POS
Antibody Screen: NEGATIVE
Unit division: 0
Unit division: 0
Unit division: 0

## 2020-03-19 LAB — CBC
HCT: 29 % — ABNORMAL LOW (ref 39.0–52.0)
Hemoglobin: 8.3 g/dL — ABNORMAL LOW (ref 13.0–17.0)
MCH: 23.9 pg — ABNORMAL LOW (ref 26.0–34.0)
MCHC: 28.6 g/dL — ABNORMAL LOW (ref 30.0–36.0)
MCV: 83.6 fL (ref 80.0–100.0)
Platelets: 66 10*3/uL — ABNORMAL LOW (ref 150–400)
RBC: 3.47 MIL/uL — ABNORMAL LOW (ref 4.22–5.81)
RDW: 18.4 % — ABNORMAL HIGH (ref 11.5–15.5)
WBC: 3 10*3/uL — ABNORMAL LOW (ref 4.0–10.5)
nRBC: 0 % (ref 0.0–0.2)

## 2020-03-19 LAB — BPAM RBC
Blood Product Expiration Date: 202108062359
Blood Product Expiration Date: 202108072359
Blood Product Expiration Date: 202108082359
ISSUE DATE / TIME: 202107121729
ISSUE DATE / TIME: 202107131046
ISSUE DATE / TIME: 202107131350
Unit Type and Rh: 6200
Unit Type and Rh: 6200
Unit Type and Rh: 6200

## 2020-03-19 LAB — URINE CULTURE

## 2020-03-19 SURGERY — ESOPHAGOGASTRODUODENOSCOPY (EGD) WITH PROPOFOL
Anesthesia: Monitor Anesthesia Care

## 2020-03-19 MED ORDER — NADOLOL 20 MG PO TABS
20.0000 mg | ORAL_TABLET | Freq: Every day | ORAL | Status: DC
  Administered 2020-03-20: 20 mg via ORAL
  Filled 2020-03-19: qty 1

## 2020-03-19 MED ORDER — BUTAMBEN-TETRACAINE-BENZOCAINE 2-2-14 % EX AERO
INHALATION_SPRAY | CUTANEOUS | Status: DC | PRN
  Administered 2020-03-19: 2 via TOPICAL

## 2020-03-19 MED ORDER — PNEUMOCOCCAL 13-VAL CONJ VACC IM SUSP
0.5000 mL | INTRAMUSCULAR | Status: DC
  Filled 2020-03-19: qty 0.5

## 2020-03-19 MED ORDER — PROPOFOL 500 MG/50ML IV EMUL
INTRAVENOUS | Status: DC | PRN
  Administered 2020-03-19: 100 ug/kg/min via INTRAVENOUS

## 2020-03-19 MED ORDER — HEPATITIS B VAC RECOMBINANT 10 MCG/ML IJ SUSP
1.0000 mL | Freq: Once | INTRAMUSCULAR | Status: DC
  Filled 2020-03-19: qty 1

## 2020-03-19 MED ORDER — HEPATITIS A VACCINE 1440 EL U/ML IM SUSP
1.0000 mL | Freq: Once | INTRAMUSCULAR | Status: DC
  Filled 2020-03-19: qty 1

## 2020-03-19 MED ORDER — SODIUM CHLORIDE 0.9 % IV SOLN
25.0000 mg | Freq: Once | INTRAVENOUS | Status: AC
  Administered 2020-03-19: 25 mg via INTRAVENOUS
  Filled 2020-03-19: qty 0.5

## 2020-03-19 MED ORDER — SODIUM CHLORIDE 0.9 % IV SOLN
1000.0000 mg | Freq: Once | INTRAVENOUS | Status: AC
  Administered 2020-03-19: 1000 mg via INTRAVENOUS
  Filled 2020-03-19 (×2): qty 20

## 2020-03-19 MED ORDER — PROPOFOL 10 MG/ML IV BOLUS
INTRAVENOUS | Status: DC | PRN
  Administered 2020-03-19: 30 mg via INTRAVENOUS

## 2020-03-19 MED ORDER — LIDOCAINE 2% (20 MG/ML) 5 ML SYRINGE
INTRAMUSCULAR | Status: DC | PRN
Start: 2020-03-19 — End: 2020-03-19
  Administered 2020-03-19: 80 mg via INTRAVENOUS

## 2020-03-19 MED ORDER — LACTATED RINGERS IV SOLN: INTRAVENOUS | Status: DC | PRN

## 2020-03-19 MED ORDER — SODIUM CHLORIDE 0.9 % IV SOLN: INTRAVENOUS | Status: DC

## 2020-03-19 SURGICAL SUPPLY — 25 items

## 2020-03-19 NOTE — Plan of Care (Signed)

## 2020-03-19 NOTE — Interval H&P Note (Signed)
History and Physical Interval Note:  03/19/2020 12:52 PM  Jun A Ocallaghan  has presented today for surgery, with the diagnosis of iron deficiency anemia and cirrhosis.  The various methods of treatment have been discussed with the patient and family. After consideration of risks, benefits and other options for treatment, the patient has consented to  Procedure(s): ESOPHAGOGASTRODUODENOSCOPY (EGD) WITH PROPOFOL (N/A) COLONOSCOPY WITH PROPOFOL (N/A) as a surgical intervention.  The patient's history has been reviewed, patient examined, no change in status, stable for surgery.  I have reviewed the patient's chart and labs.  Questions were answered to the patient's satisfaction.     Stan Head

## 2020-03-19 NOTE — Anesthesia Postprocedure Evaluation (Signed)
Anesthesia Post Note  Patient: NIKALAS BRAMEL  Procedure(s) Performed: ESOPHAGOGASTRODUODENOSCOPY (EGD) WITH PROPOFOL (N/A ) COLONOSCOPY WITH PROPOFOL (N/A ) ESOPHAGEAL BANDING     Patient location during evaluation: PACU Anesthesia Type: MAC Level of consciousness: awake and alert Pain management: pain level controlled Vital Signs Assessment: post-procedure vital signs reviewed and stable Respiratory status: spontaneous breathing, nonlabored ventilation, respiratory function stable and patient connected to nasal cannula oxygen Cardiovascular status: stable and blood pressure returned to baseline Postop Assessment: no apparent nausea or vomiting Anesthetic complications: no   No complications documented.  Last Vitals:  Vitals:   03/19/20 1425 03/19/20 1449  BP: (!) 144/71 (!) 145/76  Pulse: 60 60  Resp: 17 16  Temp:  37 C  SpO2: 100% 100%    Last Pain:  Vitals:   03/19/20 1425  TempSrc:   PainSc: 0-No pain                 Raveena Hebdon DAVID

## 2020-03-19 NOTE — Anesthesia Preprocedure Evaluation (Signed)
Anesthesia Evaluation  Patient identified by MRN, date of birth, ID band Patient awake    Reviewed: Allergy & Precautions, NPO status , Patient's Chart, lab work & pertinent test results  Airway Mallampati: I  TM Distance: >3 FB Neck ROM: Full    Dental   Pulmonary    Pulmonary exam normal        Cardiovascular hypertension, Pt. on medications Normal cardiovascular exam     Neuro/Psych    GI/Hepatic   Endo/Other    Renal/GU      Musculoskeletal   Abdominal   Peds  Hematology   Anesthesia Other Findings   Reproductive/Obstetrics                             Anesthesia Physical Anesthesia Plan  ASA: III  Anesthesia Plan: MAC   Post-op Pain Management:    Induction: Intravenous  PONV Risk Score and Plan: 1 and Treatment may vary due to age or medical condition  Airway Management Planned: Nasal Cannula  Additional Equipment:   Intra-op Plan:   Post-operative Plan:   Informed Consent: I have reviewed the patients History and Physical, chart, labs and discussed the procedure including the risks, benefits and alternatives for the proposed anesthesia with the patient or authorized representative who has indicated his/her understanding and acceptance.       Plan Discussed with: CRNA and Surgeon  Anesthesia Plan Comments:         Anesthesia Quick Evaluation

## 2020-03-19 NOTE — Anesthesia Procedure Notes (Signed)
Procedure Name: MAC Date/Time: 03/19/2020 1:20 PM Performed by: Jenne Campus, CRNA Pre-anesthesia Checklist: Patient identified, Emergency Drugs available, Suction available and Patient being monitored Oxygen Delivery Method: Nasal cannula

## 2020-03-19 NOTE — Progress Notes (Signed)
Pt returned to floor from Endoscopy

## 2020-03-19 NOTE — Op Note (Signed)
[facility] Patient Name: Wesley Williams Procedure Date : 03/19/2020 MRN: [id number] Attending MD: Wesley Williams , MD Date of Birth: [birth year] CSN: [id number] Age: 49 Admit Type: Inpatient Procedure:                Upper GI endoscopy Indications:              Iron deficiency anemia secondary to chronic blood                            loss, Cirrhosis with suspected esophageal varices Providers:                Wesley Boop, MD, Wesley Bailiff, RN, Wesley Williams, Technician, Wesley Minus, CRNA Referring MD:              Medicines:                Propofol per Anesthesia, Monitored Anesthesia Care Complications:            No immediate complications. Estimated Blood Loss:     Estimated blood loss: none. Procedure:                Pre-Anesthesia Assessment:                           - Prior to the procedure, a History and Physical                            was performed, and patient medications and                            allergies were reviewed. The patient's tolerance of                            previous anesthesia was also reviewed. The risks                            and benefits of the procedure and the sedation                            options and risks were discussed with the patient.                            All questions were answered, and informed consent                            was obtained. Prior Anticoagulants: The patient has                            taken no previous anticoagulant or antiplatelet                            agents. ASA Grade Assessment: III - A patient with  severe systemic disease. After reviewing the risks                            and benefits, the patient was deemed in                            satisfactory condition to undergo the procedure.                           After obtaining informed consent, the endoscope was                            passed under direct  vision. Throughout the                            procedure, the patient's blood pressure, pulse, and                            oxygen saturations were monitored continuously. The                            [id number]-[id number]) Olympus gastroscope was                            introduced through the mouth, and advanced to the                            second part of duodenum. The upper GI endoscopy was                            accomplished without difficulty. The patient                            tolerated the procedure well. Scope In: Scope Out: Findings:      Three columns of grade II, large (> 5 mm) varices with stigmata of       recent bleeding were found in the lower third of the esophagus,. They       were 6 mm in largest diameter. Red wale signs were present. Four bands       were successfully placed with complete eradication, resulting in       deflation of varices. There was no bleeding during and at the end of the       procedure.      The entire examined stomach was normal.      The examined duodenum was normal.      The cardia and gastric fundus were normal on retroflexion. Impression:               - Grade II and large (> 5 mm) esophageal varices                            with stigmata of recent bleeding. Completely                            eradicated. Banded.                           -  Normal stomach.                           - Normal examined duodenum.                           - No specimens collected. Recommendation:           - Return patient to hospital ward for ongoing care.                           - Resume regular diet.                           - See the other procedure note for documentation of                            additional recommendations.                           - I CHANGED METOPROLOL TO NADOLOL AS IT IS                            NON-SELECTIVE AND WILL HELP WITH PORTAL HTN                           CHECK HAV TOTAL AND HEP B S AB - MAY  NEED                            VACCINATION                           SHOULD GET PNEUMONIA VACCINE IF NOT DONE                           INFO ABOUT CIRRHOSIS AND VARICES PROVIDED IN                            ENGLISH AND SPANIS FOR DC INSTRUCTIONS - THIS COULD                            AFFECT HIS WORK WOULD AVOID LIFTING > 20# AND                            STRAINING (VALSALVA MAY INDUCE BLEEDING FROM                            VARICES)                           WILL F/U TOMORROW - SHOULD BE ABLE TO GO HOME AND I                            WILL ARRANGE OUTPATIENT F/U ME  I ALSO ORDERDED AN IRON INFUSION Procedure Code(s):        --- Professional ---                           [id number], Esophagogastroduodenoscopy, flexible,                            transoral; with band ligation of esophageal/gastric                            varices Diagnosis Code(s):        --- Professional ---                           K74.60, Unspecified cirrhosis of liver                           I85.11, Secondary esophageal varices with bleeding                           D50.0, Iron deficiency anemia secondary to blood                            loss (chronic) CPT copyright 2019 American Medical Association. All rights reserved. The codes documented in this report are preliminary and upon coder review may  be revised to meet current compliance requirements. Wesley Booparl E Wesley Cropper, MD 03/19/2020 2:21:15 PM This report has been signed electronically. Number of Addenda: 0

## 2020-03-19 NOTE — Op Note (Signed)
[facility] Patient Name: Wesley LopesJose Domenico Procedure Date : 03/19/2020 MRN: [id number] Attending MD: Iva Booparl E Eragon Hammond , MD Date of Birth: February 12, 1971 CSN: [id number] Age: 49 Admit Type: Inpatient Procedure:                Colonoscopy Indications:              Iron deficiency anemia secondary to chronic blood                            loss Providers:                Iva Booparl E. Kinan Safley, MD, [place] BailiffMaggie Chrismon, RN, Everardo PacificKelly                            Moton, Technician, Romie MinusJennifer Rock, CRNA Referring MD:              Medicines:                Propofol per Anesthesia, Monitored Anesthesia Care Complications:            No immediate complications. Estimated Blood Loss:     Estimated blood loss was minimal. Procedure:                Pre-Anesthesia Assessment:                           - Prior to the procedure, a History and Physical                            was performed, and patient medications and                            allergies were reviewed. The patient's tolerance of                            previous anesthesia was also reviewed. The risks                            and benefits of the procedure and the sedation                            options and risks were discussed with the patient.                            All questions were answered, and informed consent                            was obtained. Prior Anticoagulants: The patient has                            taken no previous anticoagulant or antiplatelet                            agents. ASA Grade Assessment: III - A patient with  severe systemic disease. After reviewing the risks                            and benefits, the patient was deemed in                            satisfactory condition to undergo the procedure.                           After obtaining informed consent, the colonoscope                            was passed under direct vision. Throughout the                             procedure, the patient's blood pressure, pulse, and                            oxygen saturations were monitored continuously. The                            CF-HQ190L ([id number]) Olympus colonoscope was                            introduced through the anus and advanced to the the                            cecum, identified by appendiceal orifice and                            ileocecal valve. The colonoscopy was performed                            without difficulty. The patient tolerated the                            procedure well. The quality of the bowel                            preparation was excellent. The ileocecal valve,                            appendiceal orifice, and rectum were photographed.                            The bowel preparation used was MoviPrep via split                            dose instruction. Scope In: 1:47:42 PM Scope Out: 1:57:24 PM Scope Withdrawal Time: 0 hours 6 minutes 8 seconds  Total Procedure Duration: 0 hours 9 minutes 42 seconds  Findings:      The perianal and digital rectal examinations were normal. Pertinent       negatives include normal prostate (size, shape, and consistency).  Rectal varices were found.      A diffuse area of altered vascular, friable (with contact bleeding) and       vascular-pattern-decreased mucosa was found in the entire colon.      A few diverticula were found in the sigmoid colon and ascending colon.      The exam was otherwise without abnormality on direct and retroflexion       views. Impression:               - Rectal varices. SMALL                           - Altered vascular, friable (with contact bleeding)                            and vascular-pattern-decreased mucosa in the entire                            examined colon. SUSPECT PORTAL COLOPATHY                           - Diverticulosis in the sigmoid colon and in the                            ascending colon. FEW                           -  The examination was otherwise normal on direct                            and retroflexion views.                           - No specimens collected. Recommendation:           - Return patient to hospital ward for ongoing care.                           - See the other procedure note for documentation of                            additional recommendations. SEE EGD REPORT                           - No recommendation at this time regarding repeat                            colonoscopy. Procedure Code(s):        --- Professional ---                           [phone], Colonoscopy, flexible; diagnostic, including                            collection of specimen(s) by brushing or washing,  when performed (separate procedure) Diagnosis Code(s):        --- Professional ---                           K92.2, Gastrointestinal hemorrhage, unspecified                           K63.89, Other specified diseases of intestine                           K64.8, Other hemorrhoids                           D50.0, Iron deficiency anemia secondary to blood                            loss (chronic) CPT copyright 2019 American Medical Association. All rights reserved. The codes documented in this report are preliminary and upon coder review may  be revised to meet current compliance requirements. Iva Boop, MD 03/19/2020 2:56:55 PM This report has been signed electronically. Number of Addenda: 0

## 2020-03-19 NOTE — Plan of Care (Signed)

## 2020-03-19 NOTE — Transfer of Care (Signed)
Immediate Anesthesia Transfer of Care Note  Patient: Wesley Williams  Procedure(s) Performed: ESOPHAGOGASTRODUODENOSCOPY (EGD) WITH PROPOFOL (N/A ) COLONOSCOPY WITH PROPOFOL (N/A ) ESOPHAGEAL BANDING  Patient Location: PACU  Anesthesia Type:MAC  Level of Consciousness: oriented, drowsy and patient cooperative  Airway & Oxygen Therapy: Patient Spontanous Breathing and Patient connected to nasal cannula oxygen  Post-op Assessment: Report given to RN and Post -op Vital signs reviewed and stable  Post vital signs: Reviewed  Last Vitals:  Vitals Value Taken Time  BP 111/59 03/19/20 1408  Temp 36.6 C 03/19/20 1408  Pulse 72 03/19/20 1408  Resp 22 03/19/20 1408  SpO2 100 % 03/19/20 1408  Vitals shown include unvalidated device data.  Last Pain:  Vitals:   03/19/20 1408  TempSrc: Temporal  PainSc: 0-No pain         Complications: No complications documented.

## 2020-03-19 NOTE — Progress Notes (Signed)
Family Medicine Teaching Service Daily Progress Note Intern Pager: [phone]  Patient name: Wesley Williams Medical record number: [id number] Date of birth: [birth year] Age: 49 y.o. Gender: male  Primary Care Provider: Horald Pollen, MD Consultants: None Code Status: FULL  Pt Overview and Major Events to Date:  Admitted 7/12  Assessment and Plan: Bricen A Villais a 49 y.o.malepresenting with 1 to 2-week history of chest discomfort and bilateral leg cramping. PMH is significant forrhabdomyolysis, alcohol abuse and withdrawal, hypertension, AKI, dysuria, erectile dysfunction.  Anemiaof Chronic Disease  liver cirrhosis  Hemoglobin 6.2 > 6.5 > 6.5 > 8.7 > 8.3.Has received 2 units, GI advised to hold 3rd unit. CT shows small varices. Based on historical PCP visit notes, this appears to be a longstanding issue with anemia of chronic disease. Magnesium 1.8 INR 1.5, PT 17.8. Hep A and B non-reactive, HIV non-reactive. EGD and colonoscopy planned for tomorrow.  - 2 units transfused thus far - offer Hep A/Hep B/PNA vaccination   - GI on board, plan to do colonoscopy and EGD tomorrow.  Will start prep today - GI recommending Anti-smooth muscle antibody, mitochonrdrial antibodies, ANA, IFA (with reflex), Alpha 1-antitrypsin - Morning BMP and CBC - CIWA scores 0, 0 for the past 2 days - iron supplementation on discharge - Interested in Hep A/B/PNA vaccine  Thrombocytopenia  Leukopenia  Elevated total protein Platelets 66, WBC 3. total protein 8.7, albumin 2.5, total bilirubin 3. PT 17.8, INR 1.5. -AM CBC -Consider hematology consult   Elevated AST AST53 > 45, ALT 22 > 23, alk phos 88 > 83.Likely contributory from longstanding alcoholism.  Hx of rhabdomyolysis Patient present with chief complaint of leg cramps.Episode in August2017: CK 1232 >1119 >800 >429. This admission, CK 111. -Consider the need for IVF  HTN No established diagnosis of hypertension, however is  on home meds of amlodipine 2.5 daily, metoprolol XL 25 mg daily. On admission, is hypertensive. Systolics 073-710, diastolic 62-69. Pulse 61-76. -Continue to monitor vitals -Continue metoprolol 25 mg daily -Previously held amlodipine to be added back after EGD/colonoscopy   Hx alcoholism Hepatic cirrhosis and findings of portal venous hypertension. History of alcoholism, however says that he has cut down on his drinking. Drinking of choice is Bud Light. Last drink on July fourth. Denies ever having shakes or seizures from alcohol withdrawal. AST 45, ALT 23, alk phos 83. -CIWA daily. 0,0 past two days -Thiamine 100 mg daily, multivitamin daily, folic acid 1 mg daily -Continue to monitor vitals  HxUTI  Intermittent suprapubic pain Patient endorses intermittent suprapubic painwith urinationfor the past month or so. History of UTI. Home med of tamsulosin 0.4 mg dailyalthough no diagnosis of BPH. -UA unremarkable, no bacteria -Follow-up urine culture -Tamsulosin 0.4 mg daily  GERD On Protonix 20 mg daily at home. -Continue Protonix  FEN/GI:Regular diet Prophylaxis:SCDs   Disposition: Continue to monitor inpatient  Subjective:  Patient continues to feel asymptomatic. He is hungry but understands that he cannot eat until EGD/colonoscopy is performed. He understands the importance of abstinence from alcohol. States that he has previously maintained sobriety and believes he will be able to do it again.   Objective: Temp:  [98.2 F (36.8 C)-99 F (37.2 C)] 99 F (37.2 C) (07/14 0442) Pulse Rate:  [61-76] 67 (07/14 0442) Resp:  [16-20] 16 (07/14 0442) BP: (140-168)/(78-97) 149/85 (07/14 0442) SpO2:  [100 %] 100 % (07/14 0442) Weight:  [86.2 kg] 86.2 kg (07/14 0551) Physical Exam: General: No acute distress, mildly tremulous hands, no appreciable  jaundice Cardiovascular: RRR, no murmurs Eyes: mildly icteric  Respiratory: CTAB Abdomen: non-tender, no distension, no  fluid wave, edge of liver palpable Extremities: 2+ DP and radial pulses   Laboratory: Recent Labs  Lab 03/18/20 0240 03/18/20 1801 03/19/20 0346  WBC 2.6* 3.4* 3.0*  HGB 6.5* 8.7* 8.3*  HCT 23.1* 30.3* 29.0*  PLT 60* 66* 66*   Recent Labs  Lab 03/13/20 1603 03/13/20 1603 03/17/20 1157 03/17/20 1516 03/18/20 0240 03/19/20 0346  NA 135  --  137  --  138 138  K 4.6   < > 3.5  --  3.7 3.8  CL 100   < > 107  --  110 112*  CO2 22   < > 19*  --  19* 18*  BUN 12  --  16  --  14 11  CREATININE 0.65*   < > 1.09  --  0.67 0.66  CALCIUM 8.7   < > 8.7*  --  8.4* 8.3*  PROT 9.0*  --  8.9* 8.7*  --   --   BILITOT 3.7*  --  3.6* 3.0*  --   --   ALKPHOS 118  --  88 83  --   --   ALT 18  --  22 23  --   --   AST 54*  --  53* 45*  --   --   GLUCOSE 118*  --  126*  --  89 90   < > = values in this interval not displayed.   Imaging/Diagnostic Tests: CT Abd/Pelv 7/13 IMPRESSION: Hepatic cirrhosis and findings of portal venous hypertension. No radiographic evidence of neoplasm or biliary obstruction. Cholelithiasis. No radiographic evidence of cholecystitis. Aortic Atherosclerosis (ICD10-I70.0).  Sharion Settler, DO 03/19/2020, 6:32 AM PGY-1, [place] Intern pager: [phone], text pages welcome

## 2020-03-20 ENCOUNTER — Encounter (HOSPITAL_COMMUNITY): Payer: Self-pay | Admitting: Internal Medicine

## 2020-03-20 DIAGNOSIS — F1010 Alcohol abuse, uncomplicated: Secondary | ICD-10-CM

## 2020-03-20 DIAGNOSIS — K7030 Alcoholic cirrhosis of liver without ascites: Principal | ICD-10-CM

## 2020-03-20 LAB — COMPREHENSIVE METABOLIC PANEL
ALT: 19 U/L (ref 0–44)
AST: 38 U/L (ref 15–41)
Albumin: 2.2 g/dL — ABNORMAL LOW (ref 3.5–5.0)
Alkaline Phosphatase: 83 U/L (ref 38–126)
Anion gap: 9 (ref 5–15)
BUN: 13 mg/dL (ref 6–20)
CO2: 17 mmol/L — ABNORMAL LOW (ref 22–32)
Calcium: 8.5 mg/dL — ABNORMAL LOW (ref 8.9–10.3)
Chloride: 112 mmol/L — ABNORMAL HIGH (ref 98–111)
Creatinine, Ser: 0.71 mg/dL (ref 0.61–1.24)
GFR calc Af Amer: >60 mL/min (ref >60)
GFR calc non Af Amer: >60 mL/min (ref >60)
Glucose, Bld: 93 mg/dL (ref 70–99)
Potassium: 3.7 mmol/L (ref 3.5–5.1)
Sodium: 138 mmol/L (ref 135–145)
Total Bilirubin: 2.9 mg/dL — ABNORMAL HIGH (ref 0.3–1.2)
Total Protein: 7.9 g/dL (ref 6.5–8.1)

## 2020-03-20 LAB — HEPATITIS B SURFACE ANTIBODY,QUALITATIVE: Hep B S Ab: NONREACTIVE

## 2020-03-20 LAB — CBC
HCT: 30.1 % — ABNORMAL LOW (ref 39.0–52.0)
Hemoglobin: 8.7 g/dL — ABNORMAL LOW (ref 13.0–17.0)
MCH: 24.9 pg — ABNORMAL LOW (ref 26.0–34.0)
MCHC: 28.9 g/dL — ABNORMAL LOW (ref 30.0–36.0)
MCV: 86 fL (ref 80.0–100.0)
Platelets: 69 10*3/uL — ABNORMAL LOW (ref 150–400)
RBC: 3.5 MIL/uL — ABNORMAL LOW (ref 4.22–5.81)
RDW: 18.6 % — ABNORMAL HIGH (ref 11.5–15.5)
WBC: 3.1 10*3/uL — ABNORMAL LOW (ref 4.0–10.5)
nRBC: 0 % (ref 0.0–0.2)

## 2020-03-20 LAB — MITOCHONDRIAL ANTIBODIES: Mitochondrial M2 Ab, IgG: <20 Units (ref 0.0–20.0)

## 2020-03-20 LAB — ANTI-SMOOTH MUSCLE ANTIBODY, IGG: F-Actin IgG: 24 Units — ABNORMAL HIGH (ref 0–19)

## 2020-03-20 LAB — ANTINUCLEAR ANTIBODIES, IFA: ANA Ab, IFA: NEGATIVE

## 2020-03-20 LAB — ALPHA-1-ANTITRYPSIN: A-1 Antitrypsin, Ser: 142 mg/dL (ref 101–187)

## 2020-03-20 LAB — HEPATITIS A ANTIBODY, TOTAL: hep A Total Ab: REACTIVE — AB

## 2020-03-20 SURGERY — ESOPHAGOGASTRODUODENOSCOPY (EGD) WITH PROPOFOL
Anesthesia: Monitor Anesthesia Care

## 2020-03-20 MED ORDER — FOLIC ACID 1 MG PO TABS: 1.0000 mg | ORAL_TABLET | Freq: Every day | ORAL | 0 refills | Status: DC

## 2020-03-20 MED ORDER — NADOLOL 20 MG PO TABS: 20.0000 mg | ORAL_TABLET | Freq: Every day | ORAL | 0 refills | Status: DC

## 2020-03-20 MED ORDER — AMLODIPINE BESYLATE 5 MG PO TABS: 5.0000 mg | ORAL_TABLET | Freq: Every day | ORAL | Status: DC

## 2020-03-20 MED ORDER — PNEUMOCOCCAL VAC POLYVALENT 25 MCG/0.5ML IJ INJ
0.5000 mL | INJECTION | Freq: Once | INTRAMUSCULAR | Status: AC
  Administered 2020-03-20: 0.5 mL via INTRAMUSCULAR
  Filled 2020-03-20: qty 0.5

## 2020-03-20 MED ORDER — ADULT MULTIVITAMIN W/MINERALS CH: 1.0000 | ORAL_TABLET | Freq: Every day | ORAL | 0 refills | Status: DC

## 2020-03-20 MED ORDER — THIAMINE HCL 100 MG PO TABS: 100.0000 mg | ORAL_TABLET | Freq: Every day | ORAL | 0 refills | Status: DC

## 2020-03-20 MED ORDER — HEPATITIS B VAC RECOMBINANT 10 MCG/0.5ML IJ SUSP
0.5000 mL | Freq: Once | INTRAMUSCULAR | Status: DC
  Filled 2020-03-20 (×2): qty 0.5

## 2020-03-20 MED ORDER — AMLODIPINE BESYLATE 2.5 MG PO TABS
2.5000 mg | ORAL_TABLET | Freq: Every day | ORAL | Status: DC
  Administered 2020-03-20: 2.5 mg via ORAL
  Filled 2020-03-20: qty 1

## 2020-03-20 MED FILL — NADOLOL 20 MG TAB: 20 | 30 days supply | Qty: 30 | Fill #0

## 2020-03-20 NOTE — Progress Notes (Signed)
Family Medicine Teaching Service Daily Progress Note Intern Pager: [phone]  Patient name: Wesley Williams Medical record number: [id number] Date of birth: 1971/08/26 Age: 49 y.o. Gender: male  Primary Care Provider: Horald Pollen, MD Consultants: GI Code Status: FULL  Pt Overview and Major Events to Date:  Admitted 7/12 EGD and Colonoscopy 7/14  Assessment and Plan: Zhyon A Villais a 49 y.o.malepresenting with 1 to 2-week history of chest discomfort and bilateral leg cramping. PMH is significant forrhabdomyolysis, alcohol abuse and withdrawal, hypertension, AKI, dysuria, erectile dysfunction.  Anemiaof Chronic Disease  liver cirrhosis  Hemoglobin 6.2 > 6.5 > 6.5 > 8.7 > 8.3 > 8.7.Has received 2 units, GI advised to hold 3rd unit. CT shows small varices.Based on historical PCP visit notes, this appears to be a longstanding issue with anemia of chronic disease.Magnesium 1.8 INR 1.5, PT 17.8. Hep A and B non-reactive, HIV non-reactive. EGD and colonoscopy  yesterday.  - Hgb has stabilized  -Hep A/Hep B/PNA vaccination  - GI recommending Anti-smooth muscle antibody, mitochonrdrial antibodies, ANA, IFA (with reflex), Alpha 1-antitrypsin; results pending  - CIWA scores 0, 0  - iron supplementation on discharge - EGD showed Grade II and large (>5 mm) esophageal varices with stigmata of recent bleeding and rectal varices. Metoprolol was changed to nadolol per GI recommendations.   Thrombocytopenia  Leukopenia  Elevated total protein Platelets69, WBC3.1 total protein 7.9, albumin 2.2, total bilirubin 2.9 PT 17.8, INR 1.5. -Hematology consultoutpatient   Elevated AST AST53 >45 > 38, ALT22 >23 > 19, alk phos 88> 83.Likely contributory from longstanding alcoholism.  Hx of rhabdomyolysis Patient present with chief complaint of leg cramps.Episode in August2017: CK 1232 >1119 >800 >429. This admission, CK 111. Has had no complaints of cramping this  admission. - No complaints today.   HTN No established diagnosis of hypertension, however is on home meds of amlodipine 2.5 daily, metoprolol XL 25 mg daily. On admission, is hypertensive. Systolics 341-962, diastolic 22-97. Pulse 60-72 -Continue to monitor vitals -Metoprolol 25 mg changed to nadolol 20 mg -Previously held amlodipine 2.5 to be added back  Hx alcoholism Hepatic cirrhosis and findings of portal venous hypertension.History of alcoholism, however says that he has cut down on his drinking. Drinking of choice is Bud Light. Last drink on July fourth. Denies ever having shakes or seizures from alcohol withdrawal. AST45, ALT 23, alk phos 83. -CIWA daily. 0,0 past two days -Thiamine 100 mg daily, multivitamin daily, folic acid 1 mg daily -Continue to monitor vitals  HxUTI  Intermittent suprapubic pain Patient endorses intermittent suprapubic painwith urinationfor the past month or so. History of UTI. Home med of tamsulosin 0.4 mg dailyalthough no diagnosis of BPH. -UA unremarkable, no bacteria -Urine culture significant for mucus, 6-10 RBCs but no bacteria  -Tamsulosin 0.4 mg daily  GERD On Protonix 20 mg daily at home. -Continue Protonix  FEN/GI:Regular diet Prophylaxis:SCDs  Disposition: Likely discharge today  Subjective:  Patient notes he feels some throat pain from EGD yesterday, however it has improved. No abdominal pain, NVD, SOB or other complaints. He is anxious to get home and states that he plans to stop drinking forever as his family is most important to him.  Objective: Temp:  [97.8 F (36.6 C)-98.8 F (37.1 C)] 98.8 F (37.1 C) (07/15 0440) Pulse Rate:  [60-72] 67 (07/15 0440) Resp:  [14-23] 17 (07/15 0440) BP: (108-179)/(59-94) 156/94 (07/15 0440) SpO2:  [98 %-100 %] 98 % (07/15 0440) Weight:  [86.2 kg] 86.2 kg (07/14 1248) Physical Exam:  General: Awake, alert, no distress Eyes: mild scleral icterus  Cardiovascular:  RRR Respiratory: CTAB Abdomen: Non-tender, liver edge palpable Extremities: No edema, 2+ radial and DP  Laboratory: Recent Labs  Lab 03/18/20 1801 03/19/20 0346 03/20/20 0335  WBC 3.4* 3.0* 3.1*  HGB 8.7* 8.3* 8.7*  HCT 30.3* 29.0* 30.1*  PLT 66* 66* 69*   Recent Labs  Lab 03/17/20 1157 03/17/20 1157 03/17/20 1516 03/18/20 0240 03/19/20 0346 03/20/20 0335  NA 137   < >  --  138 138 138  K 3.5   < >  --  3.7 3.8 3.7  CL 107   < >  --  110 112* 112*  CO2 19*   < >  --  19* 18* 17*  BUN 16   < >  --  _0 CREATININE 1.09   < >  --  0.67 0.66 0.71  CALCIUM 8.7*   < >  --  8.4* 8.3* 8.5*  PROT 8.9*  --  8.7*  --   --  7.9  BILITOT 3.6*  --  3.0*  --   --  2.9*  ALKPHOS 88  --  83  --   --  83  ALT 22  --  23  --   --  19  AST 53*  --  45*  --   --  38  GLUCOSE 126*   < >  --  89 90 93   < > = values in this interval not displayed.    Imaging/Diagnostic Tests: EGD 7/14  - Grade II and large (> 5 mm) esophageal varices with stigmata of recent bleeding. Completely  eradicated. Banded. - Normal stomach.  - Normal examined duodenum.  - No specimens collected  Colonoscopy 7/14 - Rectal varices. SMALL - Altered vascular, friable (with contact bleeding) and vascular-pattern-decreased mucosa in the entire examined colon. SUSPECT PORTAL COLOPATHY - Diverticulosis in the sigmoid colon and in the ascending colon. FEW  - The examination was otherwise normal on direct and retroflexion views. - No specimens collected.   Sharion Settler, DO 03/20/2020, 6:59 AM PGY-1, [place] Intern pager: [phone], text pages welcome

## 2020-03-20 NOTE — Plan of Care (Signed)

## 2020-03-20 NOTE — Progress Notes (Addendum)
Daily Rounding Note  03/20/2020, 8:29 AM  LOS: 2 days   SUBJECTIVE:   Chief complaint:  Anemia, cirrhosis   Feeling better and well.  No complaints  OBJECTIVE:         Vital signs in last 24 hours:    Temp:  [97.8 F (36.6 C)-98.8 F (37.1 C)] 98.8 F (37.1 C) (07/15 0440) Pulse Rate:  [60-72] 67 (07/15 0440) Resp:  [14-23] 17 (07/15 0440) BP: (108-179)/(59-94) 156/94 (07/15 0440) SpO2:  [98 %-100 %] 98 % (07/15 0440) Weight:  [86.2 kg] 86.2 kg (07/14 1248) Last BM Date: 03/19/20 Filed Weights   03/18/20 0500 03/19/20 0551 03/19/20 1248  Weight: 86 kg 86.2 kg 86.2 kg   General: looks well    Heart: RRR Chest: clear Abdomen: soft, NT, ND.  Active BS  Extremities: no CCE Neuro/Psych:  Oriented x 3.  No tremors or weakness.    Intake/Output from previous day: 07/14 0701 - 07/15 0700 In: 898 [P.O.:480; I.V.:160; IV Piggyback:258] Out: 0   Intake/Output this shift: No intake/output data recorded.  Lab Results: Recent Labs    03/18/20 1801 03/19/20 0346 03/20/20 0335  WBC 3.4* 3.0* 3.1*  HGB 8.7* 8.3* 8.7*  HCT 30.3* 29.0* 30.1*  PLT 66* 66* 69*   BMET Recent Labs    03/18/20 0240 03/19/20 0346 03/20/20 0335  NA 138 138 138  K 3.7 3.8 3.7  CL 110 112* 112*  CO2 19* 18* 17*  GLUCOSE 89 90 93  BUN 14 11 13   CREATININE 0.67 0.66 0.71  CALCIUM 8.4* 8.3* 8.5*   LFT Recent Labs    03/17/20 1157 03/17/20 1516 03/20/20 0335  PROT 8.9* 8.7* 7.9  ALBUMIN 2.6* 2.5* 2.2*  AST 53* 45* 38  ALT 22 23 19   ALKPHOS 88 83 83  BILITOT 3.6* 3.0* 2.9*  BILIDIR  --  1.4*  --   IBILI  --  1.6*  --    PT/INR Recent Labs    03/17/20 1322 03/18/20 0240  LABPROT 17.8* 17.8*  INR 1.5* 1.5*   Hepatitis Panel Recent Labs    03/17/20 2241  HEPBSAG NON REACTIVE  HCVAB NON REACTIVE  HEPAIGM NON REACTIVE  HEPBIGM NON REACTIVE    Studies/Results: No results found.   Scheduled Meds:  amLODipine   5 mg Oral Daily   folic acid  1 mg Oral Daily   hepatitis A virus (PF) vaccine  1 mL Intramuscular Once   hepatitis b vaccine for adults  1 mL Intramuscular Once   multivitamin with minerals  1 tablet Oral Daily   nadolol  20 mg Oral Daily   pantoprazole  20 mg Oral Daily   pneumococcal 23 valent vaccine  0.5 mL Intramuscular Once   tamsulosin  0.4 mg Oral Daily   thiamine  100 mg Oral Daily   Continuous Infusions: PRN Meds:.   ASSESMENT:   *  Iron def Anemia.  Vomiting blood.  Epigastric pain.   Hgb 6.2 >> 3 PRBCs >> 8.7 7/14 EGD.  Large esoph varices with stigmata recent bleeding (red wale signs) eradicated w banding x 4.     7/14 Colonoscopy.  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  Nadolol replaced metoprolol  Feraheme 7/14.    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  Improved from 30 >> 69.    *   Coagulopathy .  INR 1.5.  PLAN   *   Hepatitis A, Hep B vaccine depending on results of pending tests.    Pneumovax scheduled.   Avoid heavy lifting > 20#.    *  Dr Reece Agar will contact pt w date for office fup  *  COMPLETE LIFE LONG AVOIDANCE ETOH.    *   Stop Ibuprofen.    *   Check CBC at PMD office in 2 weeks.       Jennye Moccasin  03/20/2020, 8:29 AM Phone [phone]    [facility] Attending   I have taken an interval history, reviewed the chart and examined the patient. I agree with the Advanced Practitioner's note, impression and recommendations.    Iva Boop, MD, [facility] 03/20/2020 4:54 PM

## 2020-03-20 NOTE — Hospital Course (Addendum)
Discharge Instructions:  It is important that you abstain from alcohol. Your Norvasc has been switched to ***          Follow up:   CHANGED METOPROLOL TO NADOLOL AS IT IS                            NON-SELECTIVE AND WILL HELP WITH PORTAL HTN                           CHECK HAV TOTAL AND HEP B S AB - MAY NEED                            VACCINATION                           SHOULD GET PNEUMONIA VACCINE IF NOT DONE                           INFO ABOUT CIRRHOSIS AND VARICES PROVIDED IN                            ENGLISH AND SPANIS FOR DC INSTRUCTIONS - THIS COULD                            AFFECT HIS WORK WOULD AVOID LIFTING > 20# AND                            STRAINING   Recheck CBC in 2 weeks.

## 2020-03-20 NOTE — Discharge Instructions (Signed)
Dear Wesley Williams,  Thank you for letting us participate in your care. You were hospitalized for anemia (low blood levels) and cirrhosis.   POST-HOSPITAL & CARE INSTRUCTIONS 1. Stop drinking alcohol completely. 2. Please don't lift more than 20 lbs and avoid straining. 3. We started a new medication called nadolol. Take Nadolol 20 mg daily. Stop taking metoprolol.  4. You will need to continue to take folate,thiamine, and a multivitamin. 5. You will need to follow up with gastroenterolgy 6. Go to your follow up appointments (listed below)   DOCTOR'S APPOINTMENT   Future Appointments  Date Time Provider Department Center  04/07/2020  1:20 PM Georgina Quint, MD PCP-PCP [facility]    Follow-up Information    Georgina Quint, MD. Schedule an appointment as soon as possible for a visit in 1 day(s).   Specialty: Internal Medicine Why: For hospital follow up  Contact information: [address] [place] [address] [phone]               Take care and be well!  Family Medicine Teaching Service Inpatient Team [place]  [facility]  [address] [place], [place] [address] [phone]

## 2020-03-20 NOTE — Progress Notes (Signed)
Pt discharged home in stable condition 

## 2020-03-20 NOTE — Discharge Summary (Addendum)
Family Medicine Teaching [facility] Discharge Summary  Patient name: Wesley Williams Medical record number: [id number] Date of birth: [birth year] Age: 49 y.o. Gender: male Date of Admission: 03/17/2020  Date of Discharge: 03/20/20 Admitting Physician: Fayette Pho, MD  Primary Care Provider: Georgina Quint, MD Consultants: GI  Indication for Hospitalization: Anemia, Cirrhosis   Discharge Diagnoses/Problem List:  Anemia of Chronic Disease  Alcohol Abuse Liver Cirrhosis  Thrombocytopenia  HTN GERD  Disposition: Home  Discharge Condition: Stable  Discharge Exam:  Temp:  [97.8 F (36.6 C)-98.8 F (37.1 C)] 98.8 F (37.1 C) (07/15 0440) Pulse Rate:  [60-72] 67 (07/15 0440) Resp:  [14-23] 17 (07/15 0440) BP: (108-179)/(59-94) 156/94 (07/15 0440) SpO2:  [98 %-100 %] 98 % (07/15 0440) Weight:  [86.2 kg] 86.2 kg (07/14 1248) Physical Exam: General: Awake, alert, no distress Eyes: mild scleral icterus  Cardiovascular: RRR Respiratory: CTAB Abdomen: Non-tender, liver edge palpable Extremities: No edema, 2+ radial and DP  Brief Hospital Course:  Wesley Williams a 49 y.o.male with a history of hypertension and alcohol abuse who presented on 7/15 for evaluation of worsening anemia at the request of his PCP, found after a several week history of chest discomfort and bilateral leg cramping.   Worsening iron deficiency anemia, likely 2/2 varices:   Hemoglobin 6.2 on admit, mildly symptomatic. BL appeared about 9-10. Received 2 units PRBC's, hemoglobin stabilized. Anemia panel prior to transfusion consistent with iron deficiency (Iron 21, Ferritin 10, Sat ratio 6), folate/B12 wnl.  Initially unclear etiology as patient did not report any evidence of bleeding with FOBT negative, however given substantial alcohol use history, liver cirrhosis as etiology was investigated. CT abdomen showing hepatic cirrhosis with portal venous hypertension and mild esophageal varices without  ascites.  GI was consulted, performedEGD/colonoscopy with evidence of grade II and large (>5 mm) varices with stigmata of recent bleeding, nonbleeding rectal varices, and easily friable portal colopathy. Banded esophageal varices x4.  Started on nadolol for non-selective portal HTN benefits.  At discharge, he was hemodynamically stable with a hemoglobin of 8.7.   New diagnosis Liver Cirrhosis, in setting of chronic alcohol use:  Longstanding alcohol history, drinks approximately 10 beers daily.  GI consulted with CT and EGD/colonoscopy findings stated as above. MELD-Na score 16, <2% 90 day mortality. Poor synthetic function associated: hypoalbuminemia (2.5), thrombocytopenia (60), and elevated INR (1.5).  Provided thiamine, folate, MVI during stay.  No evidence of significant withdrawal during stay, CIWA remained around 0. He was educated on the importance of sobriety and community resources available. Hepatitis panel negative. GI additionally obtained liver serologies to rule out alternative etiology, pending at time of discharge.  He was vaccinated against PNA, will need hepatitis B outpatient.   Issues for Follow Up:  1. Please continue to stress importance of complete alcohol cessation.  2. Obtain CBC on follow up.  3. Started on Nadolol 20 mg to help with varices. Consider switching to Propanolol if problems affording this medication. Monitor BP.   4. Make sure he follows up with GI for further management of cirrhosis.  5. Please follow-up on pending liver serologies. 6. Needs Hep B vaccination (non-immune); Hep A antibody is reactive.  7. Noted intermittent thrombocytopenia (60-80) and leukopenia (~3), please reassess with follow-up CBC.  While likely related to his liver cirrhosis, could consider hematology referral if persistent.  Significant Procedures: EGD and Colonoscopy 7/14; 2 total PRBC transfusions  Significant Labs and Imaging:  Recent Labs  Lab 03/18/20 1801 03/19/20 0346  03/20/20  0335  WBC 3.4* 3.0* 3.1*  HGB 8.7* 8.3* 8.7*  HCT 30.3* 29.0* 30.1*  PLT 66* 66* 69*   Recent Labs  Lab 03/17/20 1157 03/17/20 1157 03/17/20 1516 03/18/20 0240 03/18/20 0240 03/19/20 0346 03/20/20 0335  NA 137  --   --  138  --  138 138  K 3.5   < >  --  3.7   < > 3.8 3.7  CL 107  --   --  110  --  112* 112*  CO2 19*  --   --  19*  --  18* 17*  GLUCOSE 126*  --   --  89  --  90 93  BUN 16  --   --  14  --  11 13  CREATININE 1.09  --   --  0.67  --  0.66 0.71  CALCIUM 8.7*  --   --  8.4*  --  8.3* 8.5*  MG  --   --   --  1.8  --   --   --   ALKPHOS 88  --  83  --   --   --  83  AST 53*  --  45*  --   --   --  38  ALT 22  --  23  --   --   --  19  ALBUMIN 2.6*  --  2.5*  --   --   --  2.2*   < > = values in this interval not displayed.    Ref. Range 03/19/2020 03:46  A-1 Antitrypsin, Ser Latest Ref Range: 101 - 187 mg/dL 782  ANA Ab, IFA Unknown Negative  Mitochondrial M2 Ab, IgG Latest Ref Range: 0.0 - 20.0 Units <20.0    Ref. Range 03/20/2020 03:35  Hep A Ab, Total Latest Ref Range: NON REACTIVE  Reactive (A)    Ref. Range 03/20/2020 03:35  Hep B S Ab Latest Ref Range: NON REACTIVE  NON REACTIVE    Ref. Range 03/19/2020 03:46  F-Actin IgG Latest Ref Range: 0 - 19 Units 24 (H)    EGD 7/14 - Grade II and large (>5 mm) esophageal variceswith stigmata of recent bleeding. Completely eradicated. Banded. - Normal stomach. - Normal examined duodenum. - No specimens collected  Colonoscopy 7/14 - Rectal varices. SMALL - Altered vascular, friable (with contact bleeding)and vascular-pattern-decreased mucosa in the entireexamined colon. SUSPECT PORTAL COLOPATHY - Diverticulosis in the sigmoid colon and in theascending colon. FEW - The examination was otherwise normal on direct and retroflexion views. - No specimens collected.  CT Abdomen/Pelvis 7/12 IMPRESSION: Hepatic cirrhosis and findings of portal venous hypertension. No radiographic evidence of  neoplasm or biliary obstruction. Cholelithiasis. No radiographic evidence of cholecystitis. Aortic Atherosclerosis (ICD10-I70.0).  Chest X-ray 7/12 IMPRESSION: No active cardiopulmonary disease.  Results/Tests Pending at Time of Discharge: Anti-smooth muscle antibody, mitochonrdrial antibodies, ANA, IFA (with reflex), Alpha 1-antitrypsin     Discharge Medications:  Allergies as of 03/20/2020   No Known Allergies     Medication List    STOP taking these medications   metoprolol succinate 25 MG 24 hr tablet Commonly known as: Toprol XL     TAKE these medications   amLODipine 2.5 MG tablet Commonly known as: Norvasc Take 1 tablet (2.5 mg total) by mouth daily.   folic acid 1 MG tablet Commonly known as: FOLVITE Take 1 tablet (1 mg total) by mouth daily.   methocarbamol 500 MG tablet Commonly known as: Robaxin Take 1  in the morning, 1 in the afternoon, and 1 or 2 at bedtime for muscle relaxant as needed.   multivitamin with minerals Tabs tablet Take 1 tablet by mouth daily.   nadolol 20 MG tablet Commonly known as: CORGARD Take 1 tablet (20 mg total) by mouth daily.   pantoprazole 20 MG tablet Commonly known as: Protonix Take 1 tablet (20 mg total) by mouth daily. What changed:   when to take this  reasons to take this   tamsulosin 0.4 MG Caps capsule Commonly known as: FLOMAX Take 1 capsule (0.4 mg total) by mouth daily.   thiamine 100 MG tablet Take 1 tablet (100 mg total) by mouth daily.       Discharge Instructions: Please refer to Patient Instructions section of EMR for full details.  Patient was counseled important signs and symptoms that should prompt return to medical care, changes in medications, dietary instructions, activity restrictions, and follow up appointments.   Follow-Up Appointments:  Follow-up Information    Georgina Quint, MD. Schedule an appointment as soon as possible for a visit in 1 day(s).   Specialty: Internal  Medicine Why: For hospital follow up  Contact information: [address] [place] [address] [phone]               Sabino Dick, DO 03/21/2020, 4:09 PM PGY-1, [facility]  FPTS Upper-Level Resident Addendum My edits for correction/addition/clarification are added. Please see also any attending notes.    Allayne Stack, DO  Family Medicine PGY-3

## 2020-03-21 ENCOUNTER — Other Ambulatory Visit: Payer: Self-pay

## 2020-03-21 ENCOUNTER — Telehealth: Payer: Self-pay

## 2020-03-21 DIAGNOSIS — I8510 Secondary esophageal varices without bleeding: Secondary | ICD-10-CM

## 2020-03-21 NOTE — Telephone Encounter (Signed)
Patient contacted and scheduled for EGD with banding and COVID pre- screen.  For 8/2 and 7/29.  Instructions mailed to the patient. He verbalized understanding of all instructions and dates and times.

## 2020-03-21 NOTE — Telephone Encounter (Signed)
-----   Message from Iva Boop, MD sent at 03/21/2020  2:45 PM EDT ----- Regarding: needs EGD/banding Please see if we can set him up for EGD/banding on 8/2 hospital session  BTW I would like to start at 0730 that day if the schedule can be adjusted.  He also has a PCP appt 8/2 120 PM also it looks though would help if they could see him next week or week after so I can get his varices banded again  I am cing PCP also  CEG

## 2020-03-24 ENCOUNTER — Other Ambulatory Visit: Payer: Self-pay

## 2020-03-24 ENCOUNTER — Emergency Department (HOSPITAL_COMMUNITY): Payer: Self-pay

## 2020-03-24 ENCOUNTER — Encounter (HOSPITAL_COMMUNITY): Payer: Self-pay | Admitting: Emergency Medicine

## 2020-03-24 ENCOUNTER — Emergency Department (HOSPITAL_COMMUNITY)
Admission: EM | Admit: 2020-03-24 | Discharge: 2020-03-24 | Disposition: A | Discharge status: Home / Self Care | Payer: Self-pay | Attending: Emergency Medicine | Admitting: Emergency Medicine

## 2020-03-24 DIAGNOSIS — Z20822 Contact with and (suspected) exposure to covid-19: Secondary | ICD-10-CM | POA: Insufficient documentation

## 2020-03-24 DIAGNOSIS — Z79899 Other long term (current) drug therapy: Secondary | ICD-10-CM | POA: Insufficient documentation

## 2020-03-24 DIAGNOSIS — B349 Viral infection, unspecified: Secondary | ICD-10-CM | POA: Insufficient documentation

## 2020-03-24 DIAGNOSIS — R319 Hematuria, unspecified: Secondary | ICD-10-CM | POA: Insufficient documentation

## 2020-03-24 DIAGNOSIS — I10 Essential (primary) hypertension: Secondary | ICD-10-CM | POA: Insufficient documentation

## 2020-03-24 LAB — URINALYSIS, ROUTINE W REFLEX MICROSCOPIC
Glucose, UA: NEGATIVE mg/dL
Ketones, ur: NEGATIVE mg/dL
Leukocytes,Ua: NEGATIVE
Nitrite: NEGATIVE
Protein, ur: 30 mg/dL — AB
Specific Gravity, Urine: 1.021 (ref 1.005–1.030)
pH: 6 (ref 5.0–8.0)

## 2020-03-24 LAB — CBC WITH DIFFERENTIAL/PLATELET
Abs Immature Granulocytes: 0.04 10*3/uL (ref 0.00–0.07)
Basophils Absolute: 0 10*3/uL (ref 0.0–0.1)
Basophils Relative: 0 %
Eosinophils Absolute: 0.1 10*3/uL (ref 0.0–0.5)
Eosinophils Relative: 1 %
HCT: 31.9 % — ABNORMAL LOW (ref 39.0–52.0)
Hemoglobin: 9.3 g/dL — ABNORMAL LOW (ref 13.0–17.0)
Immature Granulocytes: 1 %
Lymphocytes Relative: 5 %
Lymphs Abs: 0.4 10*3/uL — ABNORMAL LOW (ref 0.7–4.0)
MCH: 24.7 pg — ABNORMAL LOW (ref 26.0–34.0)
MCHC: 29.2 g/dL — ABNORMAL LOW (ref 30.0–36.0)
MCV: 84.6 fL (ref 80.0–100.0)
Monocytes Absolute: 0.8 10*3/uL (ref 0.1–1.0)
Monocytes Relative: 10 %
Neutro Abs: 7 10*3/uL (ref 1.7–7.7)
Neutrophils Relative %: 83 %
Platelets: 90 10*3/uL — ABNORMAL LOW (ref 150–400)
RBC: 3.77 MIL/uL — ABNORMAL LOW (ref 4.22–5.81)
RDW: 20.4 % — ABNORMAL HIGH (ref 11.5–15.5)
WBC: 8.3 10*3/uL (ref 4.0–10.5)
nRBC: 0 % (ref 0.0–0.2)

## 2020-03-24 LAB — COMPREHENSIVE METABOLIC PANEL
ALT: 21 U/L (ref 0–44)
AST: 36 U/L (ref 15–41)
Albumin: 2.6 g/dL — ABNORMAL LOW (ref 3.5–5.0)
Alkaline Phosphatase: 89 U/L (ref 38–126)
Anion gap: 7 (ref 5–15)
BUN: 12 mg/dL (ref 6–20)
CO2: 21 mmol/L — ABNORMAL LOW (ref 22–32)
Calcium: 8.3 mg/dL — ABNORMAL LOW (ref 8.9–10.3)
Chloride: 105 mmol/L (ref 98–111)
Creatinine, Ser: 0.74 mg/dL (ref 0.61–1.24)
GFR calc Af Amer: >60 mL/min (ref >60)
GFR calc non Af Amer: >60 mL/min (ref >60)
Glucose, Bld: 117 mg/dL — ABNORMAL HIGH (ref 70–99)
Potassium: 4.2 mmol/L (ref 3.5–5.1)
Sodium: 133 mmol/L — ABNORMAL LOW (ref 135–145)
Total Bilirubin: 2.6 mg/dL — ABNORMAL HIGH (ref 0.3–1.2)
Total Protein: 7.9 g/dL (ref 6.5–8.1)

## 2020-03-24 LAB — SARS CORONAVIRUS 2 BY RT PCR (HOSPITAL ORDER, PERFORMED IN ~~LOC~~ HOSPITAL LAB): SARS Coronavirus 2: NEGATIVE

## 2020-03-24 NOTE — Discharge Instructions (Signed)
Drink plenty of fluids and follow-up with your family doctor in the next week.  Your test did show some blood in your urine and they will need to follow that up

## 2020-03-24 NOTE — ED Provider Notes (Signed)
[facility] EMERGENCY DEPARTMENT Provider Note   CSN: [id number] Arrival date & time: 03/24/20  1062     History Chief Complaint  Patient presents with  . Fever    Wesley Williams is a 49 y.o. male.  Patient states that he had a fever today.  Patient states that he was sweating.  The history is provided by the patient and medical records.  Fever Temp source:  Subjective Severity:  Mild Onset quality:  Sudden Timing:  Intermittent Progression:  Resolved Chronicity:  New Relieved by:  Nothing Worsened by:  Nothing Ineffective treatments:  None tried Associated symptoms: no chest pain, no congestion, no cough, no diarrhea, no headaches and no rash        Past Medical History:  Diagnosis Date  . Alcohol abuse   . Cirrhosis (HCC)   . Hypertension   . Portal hypertension (HCC)-colopathy   . Rhabdomyolysis 04/23/2016  . Secondary esophageal varices without bleeding [facility])     Patient Active Problem List   Diagnosis Date Noted  . Secondary esophageal varices without bleeding (HCC)   . Rectal varices   . Portal hypertension (HCC)-colopathy   . Alcoholic cirrhosis of liver without ascites (HCC) 03/18/2020  . Jaundice   . Pancytopenia (HCC)   . Anemia 12/12/2017  . Erectile dysfunction 12/12/2017  . Nonintractable headache 11/26/2017  . Urinary pain 11/26/2017  . Syncope and collapse 04/23/2016  . Alcohol abuse 04/23/2016    Past Surgical History:  Procedure Laterality Date  . COLONOSCOPY WITH PROPOFOL N/A 03/19/2020   Procedure: COLONOSCOPY WITH PROPOFOL;  Surgeon: Iva Boop, MD;  Location: [facility] ENDOSCOPY;  Service: Endoscopy;  Laterality: N/A;  . ESOPHAGEAL BANDING  03/19/2020   Procedure: ESOPHAGEAL BANDING;  Surgeon: Iva Boop, MD;  Location: [facility] ENDOSCOPY;  Service: Endoscopy;;  . ESOPHAGOGASTRODUODENOSCOPY (EGD) WITH PROPOFOL N/A 03/19/2020   Procedure: ESOPHAGOGASTRODUODENOSCOPY (EGD) WITH PROPOFOL;  Surgeon: Iva Boop, MD;  [facility]  ENDOSCOPY;  Service: Endoscopy;  Laterality: N/A;       Family History  Problem Relation Age of Onset  . Diabetes Mother     Social History   Tobacco Use  . Smoking status: Never Smoker  . Smokeless tobacco: Never Used  Vaping Use  . Vaping Use: Never used  Substance Use Topics  . Alcohol use: Yes    Comment: daily  . Drug use: No    Home Medications Prior to Admission medications   Medication Sig Start Date End Date Taking? Authorizing Provider  methocarbamol (ROBAXIN) 500 MG tablet Take 1 in the morning, 1 in the afternoon, and 1 or 2 at bedtime for muscle relaxant as needed. 03/13/20  Yes Peyton Najjar, MD  nadolol (CORGARD) 20 MG tablet Take 1 tablet (20 mg total) by mouth daily. 03/21/20  Yes [name], Randa Evens, DO  amLODipine (NORVASC) 2.5 MG tablet Take 1 tablet (2.5 mg total) by mouth daily. Patient not taking: Reported on 03/17/2020 06/18/16   Trixie Dredge, PA-C  folic acid (FOLVITE) 1 MG tablet Take 1 tablet (1 mg total) by mouth daily. Patient not taking: Reported on 03/24/2020 03/21/20   [name], Randa Evens, DO  Multiple Vitamin (MULTIVITAMIN WITH MINERALS) TABS tablet Take 1 tablet by mouth daily. Patient not taking: Reported on 03/24/2020 03/21/20   [name], Randa Evens, DO  pantoprazole (PROTONIX) 20 MG tablet Take 1 tablet (20 mg total) by mouth daily. Patient not taking: Reported on 03/24/2020 03/13/20   Peyton Najjar, MD  tamsulosin (FLOMAX) 0.4 MG CAPS capsule  Take 1 capsule (0.4 mg total) by mouth daily. Patient not taking: Reported on 03/17/2020 06/18/16   Trixie Dredge, PA-C  thiamine 100 MG tablet Take 1 tablet (100 mg total) by mouth daily. Patient not taking: Reported on 03/24/2020 03/21/20   [name], Randa Evens, DO    Allergies    Patient has no known allergies.  Review of Systems   Review of Systems  Constitutional: Positive for fever. Negative for appetite change and fatigue.  HENT: Negative for congestion, ear discharge and sinus pressure.   Eyes: Negative  for discharge.  Respiratory: Negative for cough.   Cardiovascular: Negative for chest pain.  Gastrointestinal: Negative for abdominal pain and diarrhea.  Genitourinary: Negative for frequency and hematuria.  Musculoskeletal: Negative for back pain.  Skin: Negative for rash.  Neurological: Negative for seizures and headaches.  Psychiatric/Behavioral: Negative for hallucinations.    Physical Exam Updated Vital Signs BP 131/81   Pulse 67   Temp 98.8 F (37.1 C) (Oral)   Ht 5\' 5"  (1.651 m)   Wt 90.3 kg   SpO2 100%   BMI 33.12 kg/m   Physical Exam Vitals and nursing note reviewed.  Constitutional:      Appearance: He is well-developed.  HENT:     Head: Normocephalic.     Nose: Nose normal.  Eyes:     General: No scleral icterus.    Conjunctiva/sclera: Conjunctivae normal.  Neck:     Thyroid: No thyromegaly.  Cardiovascular:     Rate and Rhythm: Normal rate and regular rhythm.     Heart sounds: No murmur heard.  No friction rub. No gallop.   Pulmonary:     Breath sounds: No stridor. No wheezing or rales.  Chest:     Chest wall: No tenderness.  Abdominal:     General: There is no distension.     Tenderness: There is no abdominal tenderness. There is no rebound.  Musculoskeletal:        General: Normal range of motion.     Cervical back: Neck supple.  Lymphadenopathy:     Cervical: No cervical adenopathy.  Skin:    Findings: No erythema or rash.  Neurological:     Mental Status: He is alert and oriented to person, place, and time.     Motor: No abnormal muscle tone.     Coordination: Coordination normal.  Psychiatric:        Behavior: Behavior normal.     ED Results / Procedures / Treatments   Labs (all labs ordered are listed, but only abnormal results are displayed) Labs Reviewed  CBC WITH DIFFERENTIAL/PLATELET - Abnormal; Notable for the following components:      Result Value   RBC 3.77 (*)    Hemoglobin 9.3 (*)    HCT 31.9 (*)    MCH 24.7 (*)     MCHC 29.2 (*)    RDW 20.4 (*)    Platelets 90 (*)    Lymphs Abs 0.4 (*)    All other components within normal limits  COMPREHENSIVE METABOLIC PANEL - Abnormal; Notable for the following components:   Sodium 133 (*)    CO2 21 (*)    Glucose, Bld 117 (*)    Calcium 8.3 (*)    Albumin 2.6 (*)    Total Bilirubin 2.6 (*)    All other components within normal limits  URINALYSIS, ROUTINE W REFLEX MICROSCOPIC - Abnormal; Notable for the following components:   Color, Urine AMBER (*)    Hgb urine dipstick  LARGE (*)    Bilirubin Urine SMALL (*)    Protein, ur 30 (*)    Bacteria, UA FEW (*)    All other components within normal limits  SARS CORONAVIRUS 2 BY RT PCR (HOSPITAL ORDER, PERFORMED IN [facility])  URINE CULTURE    EKG None  Radiology DG Chest Port 1 View  Result Date: 03/24/2020 CLINICAL DATA:  Fever. EXAM: PORTABLE CHEST 1 VIEW COMPARISON:  03/17/2020 chest radiograph. FINDINGS: The heart size and mediastinal contours are within normal limits. Both lungs are clear. No pneumothorax or pleural effusion. Multilevel spondylosis. IMPRESSION: No focal airspace disease. Electronically Signed   By: Stana Bunting M.D.   On: 03/24/2020 08:42    Procedures Procedures (including critical care time)  Medications Ordered in ED Medications - No data to display  ED Course  I have reviewed the triage vital signs and the nursing notes.  Pertinent labs & imaging results that were available during my care of the patient were reviewed by me and considered in my medical decision making (see chart for details).    MDM Rules/Calculators/A&P                          Patient with fever subjective.  Labs unremarkable along with x-ray.  He will follow up with PCP.  Patient did have some hematuria and he will get a urine culture       This patient presents to the ED for concern of fever this involves an extensive number of treatment options, and is a complaint that  carries with it a high risk of complications and morbidity.  The differential diagnosis includes bronchitis UTI   Lab Tests:   I Ordered, reviewed, and interpreted labs, which included CBC chemistries urinalysis.  Patient has anemia and hematuria with elevated bili  Medicines ordered:     Imaging Studies ordered:   I ordered imaging studies which included chest x-ray and  I independently visualized and interpreted imaging which showed unremarkable  Additional history obtained:   Additional history obtained from records Previous records obtained and reviewed. Consultations Obtained:    Reevaluation:  After the interventions stated above, I reevaluated the patient and found mild improvement  Critical Interventions:  .   Final Clinical Impression(s) / ED Diagnoses Final diagnoses:  Viral syndrome  Hematuria of unknown cause    Rx / DC Orders ED Discharge Orders    None       Bethann Berkshire, MD 03/24/20 1106

## 2020-03-24 NOTE — ED Triage Notes (Signed)
Pt states "I woke up early this morning sweating and felt like I had a fever" denies any pain or other s/s.

## 2020-03-25 LAB — URINE CULTURE: Culture: NO GROWTH

## 2020-03-27 ENCOUNTER — Encounter: Payer: Self-pay | Admitting: Emergency Medicine

## 2020-03-27 ENCOUNTER — Ambulatory Visit (INDEPENDENT_AMBULATORY_CARE_PROVIDER_SITE_OTHER): Payer: Self-pay | Admitting: Emergency Medicine

## 2020-03-27 ENCOUNTER — Other Ambulatory Visit: Payer: Self-pay

## 2020-03-27 VITALS — BP 128/72 | HR 61 | Temp 97.6°F | Ht 69.0 in | Wt 189.6 lb

## 2020-03-27 DIAGNOSIS — D61818 Other pancytopenia: Secondary | ICD-10-CM

## 2020-03-27 DIAGNOSIS — K766 Portal hypertension: Secondary | ICD-10-CM

## 2020-03-27 DIAGNOSIS — I8510 Secondary esophageal varices without bleeding: Secondary | ICD-10-CM

## 2020-03-27 DIAGNOSIS — K7030 Alcoholic cirrhosis of liver without ascites: Secondary | ICD-10-CM

## 2020-03-27 DIAGNOSIS — F1010 Alcohol abuse, uncomplicated: Secondary | ICD-10-CM

## 2020-03-27 DIAGNOSIS — R319 Hematuria, unspecified: Secondary | ICD-10-CM

## 2020-03-27 MED ORDER — NADOLOL 20 MG PO TABS: 20.0000 mg | ORAL_TABLET | Freq: Every day | ORAL | 3 refills | Status: DC

## 2020-03-27 NOTE — Patient Instructions (Addendum)
If you have lab work done today you will be contacted with your lab results within the next 2 weeks.  If you have not heard from [place] then please contact us. The fastest way to get your results is to register for My Chart.   IF you received an x-ray today, you will receive an invoice from [facility]. Please contact [facility] at [phone] with questions or concerns regarding your invoice.   IF you received labwork today, you will receive an invoice from [facility]. Please contact [facility] at [phone] with questions or concerns regarding your invoice.   Our billing staff will not be able to assist you with questions regarding bills from these companies.  You will be contacted with the lab results as soon as they are available. The fastest way to get your results is to activate your My Chart account. Instructions are located on the last page of this paperwork. If you have not heard from [place] regarding the results in 2 weeks, please contact this office.     Cirrosis Cirrhosis  La cirrosis es una lesin de largo plazo (crnica) en el hgado. El hgado es el rgano interno ms grande del cuerpo, y cumple muchas funciones. Este rgano transforma los [employer], elimina las sustancias txicas de la [place], [place] protenas importantes y absorbe las vitaminas necesarias de los alimentos. En la cirrosis, las clulas hepticas son reemplazadas por tejido cicatricial. Esto impide que la sangre circule por el hgado, lo que dificulta el funcionamiento de este rgano. La fibrosis heptica no es reversible, pero el tratamiento puede evitar que empeore. Cules son las causas? Las causas ms comunes de la cirrosis son la hepatitisC y el consumo prolongado de alcohol. Otras causas son:  Enfermedad por hgado graso no alcohlico. Esto sucede cuando la grasa se deposita en el hgado por otras causas que no sean el alcohol.  Infeccin por hepatitisB.  Hepatitis  autoinmune. En esta enfermedad, el sistema de defensa del cuerpo (sistema inmunitario) ataca por error a las clulas del hgado, lo que causa irritacin e hinchazn (inflamacin).  Enfermedades que [employer] conductos dentro del hgado.  Enfermedades hepticas hereditarias, como la hemocromatosis. Esta es una de las enfermedades hepticas hereditarias ms comunes. En esta enfermedad, depsitos de hierro se acumulan en el hgado y otros rganos.  Reacciones a determinados medicamentos a Air cabin crew, como la [place], un medicamento para Insurance underwriter.  Infecciones por parsitos. Estas incluyen la esquistosomiasis, que es una enfermedad causada por un platelminto.  Contacto prolongado con ciertas sustancias txicas. Estas sustancias txicas incluyen ciertos solventes orgnicos, tales como cloroformo y tolueno. Qu incrementa el riesgo? Es ms probable que usted sufra esta afeccin si:  Tiene ciertos tipos de hepatitis viral.  Consume alcohol en exceso, especialmente si es mujer.  Tiene sobrepeso.  Comparte agujas.  Tiene relaciones sexuales sin usar proteccin con alguien que tiene una hepatitis viral. Cules son los signos o los sntomas? Es posible que no tenga signos ni sntomas al principio. Puede que los sntomas no se manifiesten hasta que el dao heptico empiece a Theme park manager. Entre los primeros sntomas se pueden incluir los siguientes:  Debilidad y cansancio (fatiga).  Cambios en los patrones de sueo o dificultad para dormir.  Picazn.  Dolor a Merchand[employer], retail parte superior derecha del abdomen.  Adelgazamiento y prdida de masa muscular.  Nuseas.  Prdida del apetito.  Aparicin de vasos sanguneos diminutos debajo de la piel. Entre los sntomas que aparecen en [place]  etapa posterior se pueden incluir los siguientes:  Fatiga o debilidad que [place].  Piel y ojos amarillos (ictericia).  Acumulacin de lquido en el abdomen (ascitis). Puede notar que la ropa  se le aprieta alrededor de la cintura.  Aumento de [place].  Hinchazn de los pies y los tobillos (edema).  Dificultad para respirar.  Tener hematomas o hemorragias.  Vmitos de [place].  Heces con sangre o de color negro.  Confusin mental. Cmo se diagnostica? El mdico puede sospechar la presencia de cirrosis en funcin de los sntomas y la historia clnica, en especial si tiene otras enfermedades o antecedentes de consumo de alcohol. El [employer] har un examen fsico para palparle el hgado y buscar signos de cirrosis. Tambin podr realizarle otras pruebas, por ejemplo:  Anlisis de sangre para controlar: ? Si tiene hepatitis B o C. ? La funcin renal. ? La funcin heptica.  Pruebas de diagnstico por imgenes, por ejemplo: ? Health visitor (RM) o exploracin por tomografa computarizada (TC) para buscar los cambios que se observan en los casos de cirrosis [place]. ? Ecografa para determinar si el tejido heptico normal est siendo reemplazado por tejido cicatricial.  Un procedimiento en el que se [place] una aguja larga para tomar Lauris Poag de tejido heptico para estudiarlo en un laboratorio (biopsia). La biopsia de hgado puede confirmar el diagnstico de cirrosis. Cmo se trata? El tratamiento de esta afeccin depende de la magnitud del dao heptico y qu lo caus. Puede incluir el tratamiento de los sntomas de la cirrosis o de las causas preexistentes, a fin de Scientist, research (physical sciences) el avance del [place]. El tratamiento puede incluir lo siguiente:  Cambios de estilo de vida, como: ? Consumir una dieta saludable. Puede tener que consultar al mdico o a un especialista en alimentacin y nutricin (nutricionista) a fin de Architect de alimentacin. ? Restringir la ingesta de sal. ? Mantener un peso saludable. ? No consumir drogas o alcohol.  Tomar medicamentos para: ? Warehouse manager las infecciones del hgado u otras infecciones. ? Controlar la picazn. ? Disminuir  la acumulacin de lquido. ? Reducir ciertas sustancias txicas de la sangre. ? Reducir el riesgo de hemorragia de los vasos sanguneos agrandados en el estmago o el esfago (vrices).  Trasplante de hgado. En este procedimiento, el hgado de un donante se [place] para reemplazar el hgado enfermo. Esto se realiza si la cirrosis ha ocasionado insuficiencia heptica. Otros tratamientos y procedimientos, segn los problemas que usted tenga debido a la cirrosis. Los problemas frecuentes incluyen insuficiencia renal relacionada con el hgado (sndrome hepatorenal). Siga estas indicaciones en su casa:   Tome los medicamentos solamente como se lo haya indicado el mdico. No use medicamentos que sean txicos para el hgado. Consulte al mdico antes de tomar algn medicamento nuevo, incluidos los de [place].  Descanse todo lo que sea necesario.  Mantenga una dieta bien balanceada. Solicite ms informacin al mdico o al nutricionista.  Limite el consumo de sal o de agua, si el mdico le indica que lo haga.  No beba alcohol. Esto es especialmente importante si est tomando paracetamol.  Concurra a todas las visitas de [address] se lo haya indicado el mdico. Esto es importante. Comunquese con un mdico si:  Tiene fatiga o debilidad que empeora.  Observa que se le [employer], los pies, las piernas o la cara.  Tiene fiebre.  Pierde el apetito.  Siente nuseas o vmitos.  Tiene ictericia.  Se le forman hematomas o sangra con facilidad.  Solicite ayuda inmediatamente si:  Vomita sangre de color rojo brillante o una sustancia parecida a los granos de caf.  Tiene [employer].  Nota que las heces son de color negro y de aspecto alquitranado.  Comienza a sentirse confundido.  Tiene dolor en el pecho o dificultad para respirar. Resumen  La cirrosis es una lesin crnica en el hgado. El dao heptico no puede revertirse. Las causas ms comunes son la  hepatitisC y el consumo prolongado de alcohol.  Las pruebas para diagnosticar la cirrosis incluyen anlisis de [place], estudios de diagnstico por imgenes y biopsia del hgado.  El tratamiento de la afeccin incluye el tratamiento de la afeccin preexistente. Evite el consumo de alcohol, drogas, sal y los medicamentos que pueden daar el hgado.  Comunquese con su mdico si presenta ascitis, edema, ictericia, fiebre, nuseas o vmitos, formacin de hematomas o sangrado con facilidad, o empeoramiento de la fatiga. Esta informacin no tiene Theme park manager el consejo del mdico. Asegrese de hacerle al mdico cualquier pregunta que tenga. Document Revised: 12/02/2017 Document Reviewed: 09/16/2017 Elsevier Patient Education  2020 [employer].

## 2020-03-27 NOTE — Progress Notes (Signed)
Wesley Williams 49 y.o.   Chief Complaint  Patient presents with  . Hospitalization Follow-up    Pt was admitte in the hospital on 03/24/2020 with a fever and sweating. Also they found some hematuria   [facility] Discharge Summary  Patient name: Wesley Williams    Medical record number: [id number] Date of birth: 1971-05-01        Age: 49 y.o.    Gender: male Date of Admission: 03/17/2020                      Date of Discharge: 03/20/20 Admitting Physician: Ezequiel Essex, MD  Primary Care Provider: Horald Pollen, MD Consultants: GI  Indication for Hospitalization: Anemia, Cirrhosis   Discharge Diagnoses/Problem List:  Anemia of Chronic Disease  Alcohol Abuse Liver Cirrhosis  Thrombocytopenia  HTN GERD  Disposition: Home  Discharge Condition: Stable Brief Hospital Course:  Deverick A Villais a 49 y.o.malepresenting with 1 to 2-week history of chest discomfort and bilateral leg cramping. PMH is significant forrhabdomyolysis, alcohol abuse and withdrawal, hypertension, AKI, dysuria, erectile dysfunction.  Asymptomatic Anemiaof Chronic Disease   Patient presented to the ED after call from PCP after recent outpatient blood work showed a Hgb of 6.2. FOBT negative. Received 2 units PRBC's, hemoglobin stabilized and 8.7 on day of discharge. CT with evidence of small varices.EGD with evidence of grade II and large (>5 mm) varices with stigmata of recent bleeding. Banded x4. Colonoscopy with evidence of rectal varices. Metoprolol switched to nadolol. Based on historical PCP visit notes, this appears to be a longstanding issue with anemia of chronic disease and previously referred to hematology. GI also ordered Anti-smooth muscle antibody, mitochonrdrial antibodies, ANA, IFA (with reflex), Alpha 1-antitrypsin for which results are still pending.   Alcohol Abuse, Liver cirrhosis CT with hepatic cirrhosis and findings of portal venous  hypertension.History of alcoholism, about 10 beers daily, though states last drink was 7/4. Denies ever having shakes or seizures from alcohol withdrawal. CIWA 0,0 on admission. AST45, ALT 23, alk phos 83. Thiamine, folic acid and multivitamin given to patient. He remained stable, educated on the importance of sobriety and resources available. Will need to follow up with GI outpatient for continued management.   Thrombocytopenia  Leukopenia  Elevated total protein Platelets69, WBC3.1total protein7.9, albumin 2.2, total bilirubin2.9PT 17.8, INR 1.5. Previously received referral to hematology. Stable throughout admission, would still advise outpatient hematology referral to further workup pancytopenia.  HTN No established diagnosis of hypertension, however is on home meds of amlodipine 2.5 daily, metoprolol XL 25 mg daily. Patient non-compliant with his medication. Metoprolol switched to nadolol by GI. Home amlodipine initially held on admission but restarted after pressures elevating 915'A systolic, 56'P diastolic.  Hx of rhabdomyolysis History of leg cramps, previously treated with Ketorolac by PCP. Episode in August2017: CK 1232 >1119 >800 >429. This admission, CK 111. Stable on admission with no complaints of this. Ketorolac iscontinued on admission and discharge.  Issues for Follow Up:  1. Metoprolol 25 mg switched to Nadolol 20 mg to help with varices. Consider switching to Propanolol if problems affording.  2. Received PNA-23 vaccination during hospitalization. 3. Hep B vaccination outpatient; Hep A antibody is reactive 4. Follow up with GI for management of cirrhosis; annual EGD's to follow up on varices 5. Continue to educate patient on importance of sobriety, provide resources available.  Significant Procedures: EGD and Colonoscopy 7/14; 2 total PRBC transfusions   HISTORY OF PRESENT ILLNESS: This is  a 49 y.o. male with history of alcoholic liver cirrhosis here for  follow-up.  Recently in the hospital from 7/12 to 03/20/2020 with anemia and GI blood loss.  Received blood transfusion.  Evaluated by GI service.  Discharge summary reviewed. Today feels well.  No complaints or medical concerns.  HPI   Prior to Admission medications   Medication Sig Start Date End Date Taking? Authorizing Provider  amLODipine (NORVASC) 2.5 MG tablet Take 1 tablet (2.5 mg total) by mouth daily. 06/18/16  Yes West, Emily, PA-C  folic acid (FOLVITE) 1 MG tablet Take 1 tablet (1 mg total) by mouth daily. 03/21/20  Yes [name], [name], DO  methocarbamol (ROBAXIN) 500 MG tablet Take 1 in the morning, 1 in the afternoon, and 1 or 2 at bedtime for muscle relaxant as needed. 03/13/20  Yes Posey Boyer, MD  Multiple Vitamin (MULTIVITAMIN WITH MINERALS) TABS tablet Take 1 tablet by mouth daily. 03/21/20  Yes [name], [name], DO  nadolol (CORGARD) 20 MG tablet Take 1 tablet (20 mg total) by mouth daily. 03/21/20  Yes [name], [name], DO  pantoprazole (PROTONIX) 20 MG tablet Take 1 tablet (20 mg total) by mouth daily. 03/13/20  Yes Posey Boyer, MD  tamsulosin (FLOMAX) 0.4 MG CAPS capsule Take 1 capsule (0.4 mg total) by mouth daily. 06/18/16  Yes West, Emily, PA-C  thiamine 100 MG tablet Take 1 tablet (100 mg total) by mouth daily. 03/21/20  Yes [name], [name], DO    No Known Allergies  Patient Active Problem List   Diagnosis Date Noted  . 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 Rectal varices   . Portal hypertension (HCC)-colopathy   . Alcoholic cirrhosis of liver without ascites ([place]) 03/18/2020  . Jaundice   . Pancytopenia ([place])   . Anemia 12/12/2017  . Erectile dysfunction 12/12/2017  . Nonintractable headache 11/26/2017  . Urinary pain 11/26/2017  . Syncope and collapse 04/23/2016  . Alcohol abuse 04/23/2016    Past Medical History:  Diagnosis Date  . Alcohol abuse   . Cirrhosis ([place])   . Hypertension   . Portal hypertension  (HCC)-colopathy   . Rhabdomyolysis 04/23/2016  . Secondary esophageal varices without bleeding [facility])     Past Surgical History:  Procedure Laterality Date  . COLONOSCOPY WITH PROPOFOL N/A 03/19/2020   Procedure: COLONOSCOPY WITH PROPOFOL;  Surgeon: [name], MD;  Location: [facility] ENDOSCOPY;  Service: Endoscopy;  Laterality: N/A;  . ESOPHAGEAL BANDING  03/19/2020   Procedure: ESOPHAGEAL BANDING;  Surgeon: [name], MD;  Location: [place];  Service: Endoscopy;;  . ESOPHAGOGASTRODUODENOSCOPY (EGD) WITH PROPOFOL N/A 03/19/2020   Procedure: ESOPHAGOGASTRODUODENOSCOPY (EGD) WITH PROPOFOL;  Surgeon: [name], MD;  Location: [place];  Service: Endoscopy;  Laterality: N/A;    Social History   Socioeconomic History  . Marital status: Single    Spouse name: Not on file  . Number of children: Not on file  . Years of education: Not on file  . Highest education level: Not on file  Occupational History  . Not on file  Tobacco Use  . Smoking status: Never Smoker  . Smokeless tobacco: Never Used  Vaping Use  . Vaping Use: Never used  Substance and Sexual Activity  . Alcohol use: Yes    Comment: daily  . Drug use: No  . Sexual activity: Yes  Other Topics Concern  . Not on file  Social History Narrative  . Not on file   Social Determinants of Health  Financial Resource Strain:   . Difficulty of Paying Living Expenses:   Food Insecurity:   . Worried About Charity fundraiser in the Last Year:   . Arboriculturist in the Last Year:   Transportation Needs:   . Film/video editor (Medical):   Marland Kitchen Lack of Transportation (Non-Medical):   Physical Activity:   . Days of Exercise per Week:   . Minutes of Exercise per Session:   Stress:   . Feeling of Stress :   Social Connections:   . Frequency of Communication with Friends and Family:   . Frequency of Social Gatherings with Friends and Family:   . Attends Religious Services:   . Active Member of Clubs or  Organizations:   . Attends Archivist Meetings:   Marland Kitchen Marital Status:   Intimate Partner Violence:   . Fear of Current or Ex-Partner:   . Emotionally Abused:   Marland Kitchen Physically Abused:   . Sexually Abused:     Family History  Problem Relation Age of Onset  . Diabetes Mother      Review of Systems  Constitutional: Negative.  Negative for chills and fever.  HENT: Negative.  Negative for congestion and sore throat.   Respiratory: Negative.  Negative for cough and shortness of breath.   Cardiovascular: Negative.  Negative for chest pain and palpitations.  Gastrointestinal: Negative.  Negative for abdominal pain, blood in stool, diarrhea, melena, nausea and vomiting.  Genitourinary: Negative.  Negative for dysuria.  Musculoskeletal: Negative.  Negative for back pain, myalgias and neck pain.  Skin: Negative.  Negative for rash.  Neurological: Negative.  Negative for dizziness and headaches.  All other systems reviewed and are negative.  Today's Vitals   03/27/20 1104  BP: 128/72  Pulse: 61  Temp: 97.6 F (36.4 C)  TempSrc: Temporal  SpO2: 100%  Weight: 189 lb 9.6 oz (86 kg)  Height: '5\' 9"'  (1.753 m)   Body mass index is 28 kg/m.   Physical Exam Vitals reviewed.  Constitutional:      Appearance: Normal appearance.  HENT:     Head: Normocephalic.  Eyes:     Extraocular Movements: Extraocular movements intact.     Conjunctiva/sclera: Conjunctivae normal.     Pupils: Pupils are equal, round, and reactive to light.  Cardiovascular:     Rate and Rhythm: Normal rate and regular rhythm.     Pulses: Normal pulses.     Heart sounds: Normal heart sounds.  Pulmonary:     Effort: Pulmonary effort is normal.     Breath sounds: Normal breath sounds.  Abdominal:     Palpations: Abdomen is soft.     Tenderness: There is no abdominal tenderness.  Musculoskeletal:        General: Normal range of motion.     Cervical back: Normal range of motion and neck supple.  Skin:     General: Skin is warm and dry.  Neurological:     General: No focal deficit present.     Mental Status: He is alert and oriented to person, place, and time.     Comments: No signs of hepatic encephalopathy.  Psychiatric:        Mood and Affect: Mood normal.        Behavior: Behavior normal.      ASSESSMENT & PLAN: Montay was seen today for hospitalization follow-up.  Diagnoses and all orders for this visit:  Alcoholic cirrhosis of liver without ascites ([place])  Portal hypertension (  HCC)-colopathy  Pancytopenia ([place])  Alcohol abuse  Secondary esophageal varices without bleeding (HCC) -     nadolol (CORGARD) 20 MG tablet; Take 1 tablet (20 mg total) by mouth daily.  Hematuria, unspecified type -     POCT urinalysis dipstick; Future    Patient Instructions       If you have lab work done today you will be contacted with your lab results within the next 2 weeks.  If you have not heard from [place] then please contact us. The fastest way to get your results is to register for My Chart.   IF you received an x-ray today, you will receive an invoice from [facility]. Please contact [facility] at [phone] with questions or concerns regarding your invoice.   IF you received labwork today, you will receive an invoice from [facility]. Please contact [facility] at [phone] with questions or concerns regarding your invoice.   Our billing staff will not be able to assist you with questions regarding bills from these companies.  You will be contacted with the lab results as soon as they are available. The fastest way to get your results is to activate your My Chart account. Instructions are located on the last page of this paperwork. If you have not heard from [place] regarding the results in 2 weeks, please contact this office.     Cirrosis Cirrhosis  La cirrosis es una lesin de largo plazo (crnica) en el hgado. El hgado es el rgano interno ms grande del cuerpo,  y cumple muchas funciones. Este rgano transforma los [employer], elimina las sustancias txicas de la [place] protenas importantes y absorbe las vitaminas necesarias de los alimentos. En la cirrosis, las clulas hepticas son reemplazadas por tejido cicatricial. Esto impide que la sangre circule por el hgado, lo que dificulta el funcionamiento de este rgano. La fibrosis heptica no es reversible, pero el tratamiento puede evitar que empeore. Cules son las causas? Las causas ms comunes de la cirrosis son la hepatitisC y el consumo prolongado de alcohol. Otras causas son:  Enfermedad por hgado graso no alcohlico. Esto sucede cuando la grasa se deposita en el hgado por otras causas que no sean el alcohol.  Infeccin por hepatitisB.  Hepatitis autoinmune. En esta enfermedad, el sistema de defensa del cuerpo (sistema inmunitario) ataca por error a las clulas del hgado, lo que causa irritacin e hinchazn (inflamacin).  Enfermedades que [employer] conductos dentro del hgado.  Enfermedades hepticas hereditarias, como la hemocromatosis. Esta es una de las enfermedades hepticas hereditarias ms comunes. En esta enfermedad, depsitos de hierro se acumulan en el hgado y otros rganos.  Reacciones a determinados medicamentos a Barrister's clerk, como la [place], un medicamento para Film/video editor.  Infecciones por parsitos. Estas incluyen la esquistosomiasis, que es una enfermedad causada por un platelminto.  Contacto prolongado con ciertas sustancias txicas. Estas sustancias txicas incluyen ciertos solventes orgnicos, tales como cloroformo y tolueno. Qu incrementa el riesgo? Es ms probable que usted sufra esta afeccin si:  Tiene ciertos tipos de hepatitis viral.  Consume alcohol en exceso, especialmente si es mujer.  Tiene sobrepeso.  Comparte agujas.  Tiene relaciones sexuales sin usar proteccin con alguien que tiene una hepatitis viral. Cules son los  signos o los sntomas? Es posible que no tenga signos ni sntomas al principio. Puede que los sntomas no se manifiesten hasta que el dao heptico empiece a Copy. Entre los primeros sntomas se pueden incluir los siguientes:  Debilidad y cansancio (fatiga).  Cambios en los patrones de sueo o dificultad para dormir.  Picazn.  Dolor a Doctor, hospital parte superior derecha del abdomen.  Adelgazamiento y prdida de masa muscular.  Nuseas.  Prdida del apetito.  Aparicin de vasos sanguneos diminutos debajo de la piel. Entre los sntomas que aparecen en una etapa posterior se pueden incluir los siguientes:  Fatiga o debilidad que [place].  Piel y ojos amarillos (ictericia).  Acumulacin de lquido en el abdomen (ascitis). Puede notar que la ropa se le aprieta alrededor de la cintura.  Aumento de [place].  Hinchazn de los pies y los tobillos (edema).  Dificultad para respirar.  Tener hematomas o hemorragias.  Vmitos de [place].  Heces con sangre o de color negro.  Confusin mental. Cmo se diagnostica? El mdico puede sospechar la presencia de cirrosis en funcin de los sntomas y la historia clnica, en especial si tiene otras enfermedades o antecedentes de consumo de alcohol. El [employer] har un examen fsico para palparle el hgado y buscar signos de cirrosis. Tambin podr realizarle otras pruebas, por ejemplo:  Anlisis de sangre para controlar: ? Si tiene hepatitis B o C. ? La funcin renal. ? La funcin heptica.  Pruebas de diagnstico por imgenes, por ejemplo: ? Health visitor (RM) o exploracin por tomografa computarizada (TC) para buscar los cambios que se observan en los casos de cirrosis [place]. ? Ecografa para determinar si el tejido heptico normal est siendo reemplazado por tejido cicatricial.  Un procedimiento en el que se [place] una aguja larga para tomar Truddie Coco de tejido heptico para estudiarlo en un laboratorio (biopsia).  La biopsia de hgado puede confirmar el diagnstico de cirrosis. Cmo se trata? El tratamiento de esta afeccin depende de la magnitud del dao heptico y qu lo caus. Puede incluir el tratamiento de los sntomas de la cirrosis o de las causas preexistentes, a fin de Psychologist, clinical el avance del [place]. El tratamiento puede incluir lo siguiente:  Cambios de estilo de vida, como: ? Consumir una dieta saludable. Puede tener que consultar al mdico o a un especialista en alimentacin y nutricin (nutricionista) a fin de Tax adviser de alimentacin. ? Restringir la ingesta de sal. ? Mantener un peso saludable. ? No consumir drogas o alcohol.  Tomar medicamentos para: ? Risk manager las infecciones del hgado u otras infecciones. ? Controlar la picazn. ? Disminuir la acumulacin de lquido. ? Reducir ciertas sustancias txicas de la sangre. ? Reducir el riesgo de hemorragia de los vasos sanguneos agrandados en el estmago o el esfago (vrices).  Trasplante de hgado. En este procedimiento, el hgado de un donante se [place] para reemplazar el hgado enfermo. Esto se realiza si la cirrosis ha ocasionado insuficiencia heptica. Otros tratamientos y procedimientos, segn los problemas que usted tenga debido a la cirrosis. Los problemas frecuentes incluyen insuficiencia renal relacionada con el hgado (sndrome hepatorenal). Siga estas indicaciones en su casa:   Tome los medicamentos solamente como se lo haya indicado el mdico. No use medicamentos que sean txicos para el hgado. Consulte al mdico antes de tomar algn medicamento nuevo, incluidos los de [place].  Descanse todo lo que sea necesario.  Mantenga una dieta bien balanceada. Solicite ms informacin al mdico o al nutricionista.  Limite el consumo de sal o de agua, si el mdico le indica que lo haga.  No beba alcohol. Esto es especialmente importante si est tomando paracetamol.  Concurra a todas las visitas de  seguimiento como se lo haya indicado el mdico.  Esto es importante. Comunquese con un mdico si:  Tiene fatiga o debilidad que empeora.  Observa que se le [employer], los pies, las piernas o la cara.  Tiene fiebre.  Pierde el apetito.  Siente nuseas o vmitos.  Tiene ictericia.  Se le forman hematomas o sangra con facilidad. Solicite ayuda inmediatamente si:  Vomita sangre de color rojo brillante o una sustancia parecida a los granos de caf.  Tiene [employer].  Nota que las heces son de color negro y de aspecto alquitranado.  Comienza a sentirse confundido.  Tiene dolor en el pecho o dificultad para respirar. Resumen  La cirrosis es una lesin crnica en el hgado. El dao heptico no puede revertirse. Las causas ms comunes son la hepatitisC y el consumo prolongado de alcohol.  Las pruebas para diagnosticar la cirrosis incluyen anlisis de [place], estudios de diagnstico por imgenes y biopsia del hgado.  El tratamiento de la afeccin incluye el tratamiento de la afeccin preexistente. Evite el consumo de alcohol, drogas, sal y los medicamentos que pueden daar el hgado.  Comunquese con su mdico si presenta ascitis, edema, ictericia, fiebre, nuseas o vmitos, formacin de hematomas o sangrado con facilidad, o empeoramiento de la fatiga. Esta informacin no tiene Marine scientist el consejo del mdico. Asegrese de hacerle al mdico cualquier pregunta que tenga. Document Revised: 12/02/2017 Document Reviewed: 09/16/2017 Elsevier Patient Education  2020 Elsevier Inc.      Agustina Caroli, MD [facility]

## 2020-04-03 ENCOUNTER — Other Ambulatory Visit (HOSPITAL_COMMUNITY)
Admission: RE | Admit: 2020-04-03 | Discharge: 2020-04-03 | Disposition: A | Discharge status: Home / Self Care | Payer: HRSA Program | Source: Ambulatory Visit | Attending: Internal Medicine | Admitting: Internal Medicine

## 2020-04-03 DIAGNOSIS — Z01812 Encounter for preprocedural laboratory examination: Secondary | ICD-10-CM | POA: Insufficient documentation

## 2020-04-03 DIAGNOSIS — Z20822 Contact with and (suspected) exposure to covid-19: Secondary | ICD-10-CM | POA: Insufficient documentation

## 2020-04-03 LAB — SARS CORONAVIRUS 2 (TAT 6-24 HRS): SARS Coronavirus 2: NEGATIVE

## 2020-04-04 NOTE — Progress Notes (Signed)
Attempted to obtain medical history via telephone, unable to reach at this time. I left a voicemail to return pre surgical testing department's phone call.  

## 2020-04-06 NOTE — Anesthesia Preprocedure Evaluation (Addendum)
Anesthesia Evaluation  Patient identified by MRN, date of birth, ID band Patient awake    Reviewed: Allergy & Precautions, NPO status , Patient's Chart, lab work & pertinent test results  Airway Mallampati: I  TM Distance: >3 FB Neck ROM: Full    Dental  (+) Poor Dentition, Missing, Dental Advisory Given,    Pulmonary neg pulmonary ROS,    Pulmonary exam normal breath sounds clear to auscultation       Cardiovascular hypertension, Normal cardiovascular exam Rhythm:Regular Rate:Normal  TTE 2017 - Left ventricle: The cavity size was normal. Wall thickness wasincreased in a pattern of mild LVH. Systolic function was normal. The estimated ejection fraction was in the range of 60% to 65%. Wall motion was normal; there were no regional wall motionabnormalities. Left ventricular diastolic function parameterswere normal.  - Aortic valve: Moderately calcified annulus. Trileaflet;  moderately thickened leaflets. Valve area (VTI): 2.39 cm^2. Valve area (Vmax): 2.26 cm^2. Valve area (Vmean): 2.4 cm^2.  - Mitral valve: Mildly calcified annulus. Mildly thickened leaflets. There was mild regurgitation.  - Left atrium: The atrium was severely dilated.  - Atrial septum: No defect or patent foramen ovale was identified.  - Pulmonary arteries: Systolic pressure was moderately increased. PA peak pressure: 35 mm Hg (S).  - Technically adequate study   Neuro/Psych negative neurological ROS  negative psych ROS   GI/Hepatic negative GI ROS, (+) Cirrhosis   Esophageal Varices  substance abuse  alcohol use,   Endo/Other  negative endocrine ROS  Renal/GU negative Renal ROS  negative genitourinary   Musculoskeletal negative musculoskeletal ROS (+)   Abdominal   Peds  Hematology  (+) Blood dyscrasia (Hgb 9.3, plt 90), anemia ,   Anesthesia Other Findings   Reproductive/Obstetrics                             Anesthesia Physical Anesthesia Plan  ASA: III  Anesthesia Plan: MAC   Post-op Pain Management:    Induction: Intravenous  PONV Risk Score and Plan: 1 and Propofol infusion and Treatment may vary due to age or medical condition  Airway Management Planned: Natural Airway  Additional Equipment:   Intra-op Plan:   Post-operative Plan:   Informed Consent: I have reviewed the patients History and Physical, chart, labs and discussed the procedure including the risks, benefits and alternatives for the proposed anesthesia with the patient or authorized representative who has indicated his/her understanding and acceptance.     Dental advisory given  Plan Discussed with: CRNA  Anesthesia Plan Comments:         Anesthesia Quick Evaluation

## 2020-04-07 ENCOUNTER — Ambulatory Visit (HOSPITAL_COMMUNITY)
Admission: RE | Admit: 2020-04-07 | Discharge: 2020-04-07 | Disposition: A | Discharge status: Home / Self Care | Payer: Self-pay | Attending: Internal Medicine | Admitting: Internal Medicine

## 2020-04-07 ENCOUNTER — Other Ambulatory Visit: Payer: Self-pay

## 2020-04-07 ENCOUNTER — Encounter (HOSPITAL_COMMUNITY): Payer: Self-pay | Admitting: Internal Medicine

## 2020-04-07 ENCOUNTER — Ambulatory Visit (HOSPITAL_COMMUNITY): Payer: Self-pay | Admitting: Certified Registered Nurse Anesthetist

## 2020-04-07 ENCOUNTER — Ambulatory Visit: Payer: Self-pay | Admitting: Emergency Medicine

## 2020-04-07 ENCOUNTER — Encounter (HOSPITAL_COMMUNITY)
Admission: RE | Disposition: A | Discharge status: Home / Self Care | Payer: Self-pay | Source: Home / Self Care | Attending: Internal Medicine

## 2020-04-07 DIAGNOSIS — K7460 Unspecified cirrhosis of liver: Secondary | ICD-10-CM | POA: Insufficient documentation

## 2020-04-07 DIAGNOSIS — I8510 Secondary esophageal varices without bleeding: Secondary | ICD-10-CM

## 2020-04-07 DIAGNOSIS — K766 Portal hypertension: Secondary | ICD-10-CM | POA: Insufficient documentation

## 2020-04-07 DIAGNOSIS — I10 Essential (primary) hypertension: Secondary | ICD-10-CM | POA: Insufficient documentation

## 2020-04-07 DIAGNOSIS — I8511 Secondary esophageal varices with bleeding: Secondary | ICD-10-CM | POA: Insufficient documentation

## 2020-04-07 HISTORY — PX: ESOPHAGOGASTRODUODENOSCOPY (EGD) WITH PROPOFOL: SHX5813

## 2020-04-07 HISTORY — PX: ESOPHAGEAL BANDING: SHX5518

## 2020-04-07 SURGERY — ESOPHAGOGASTRODUODENOSCOPY (EGD) WITH PROPOFOL
Anesthesia: Monitor Anesthesia Care

## 2020-04-07 MED ORDER — SODIUM CHLORIDE 0.9 % IV SOLN: INTRAVENOUS | Status: DC

## 2020-04-07 MED ORDER — PROPOFOL 10 MG/ML IV BOLUS
INTRAVENOUS | Status: DC | PRN
  Administered 2020-04-07: 20 mg via INTRAVENOUS

## 2020-04-07 MED ORDER — LACTATED RINGERS IV SOLN
INTRAVENOUS | Status: DC
  Administered 2020-04-07: 1000 mL via INTRAVENOUS

## 2020-04-07 MED ORDER — PROPOFOL 500 MG/50ML IV EMUL
INTRAVENOUS | Status: DC | PRN
  Administered 2020-04-07: 125 ug/kg/min via INTRAVENOUS

## 2020-04-07 MED ORDER — FERROUS SULFATE 325 (65 FE) MG PO TABS: 325.0000 mg | ORAL_TABLET | Freq: Every day | ORAL | 3 refills | Status: DC

## 2020-04-07 MED ORDER — LIDOCAINE 2% (20 MG/ML) 5 ML SYRINGE
INTRAMUSCULAR | Status: DC | PRN
  Administered 2020-04-07: 80 mg via INTRAVENOUS

## 2020-04-07 SURGICAL SUPPLY — 14 items

## 2020-04-07 NOTE — Anesthesia Procedure Notes (Signed)
Procedure Name: MAC Date/Time: 04/07/2020 7:38 AM Performed by: West Pugh, CRNA Pre-anesthesia Checklist: Patient identified, Emergency Drugs available, Suction available, Patient being monitored and Timeout performed Patient Re-evaluated:Patient Re-evaluated prior to induction Oxygen Delivery Method: Simple face mask Preoxygenation: Pre-oxygenation with 100% oxygen Induction Type: IV induction Placement Confirmation: positive ETCO2 Dental Injury: Teeth and Oropharynx as per pre-operative assessment

## 2020-04-07 NOTE — H&P (Signed)
[facility] History and Physical   Primary Care Physician:  Georgina Quint, MD   Reason for Procedure:   upper endoscopy and variceal ligation  Plan:    EGD - possible variceal banding    HPI: Wesley Williams is a 49 y.o. male w/ hx recent admit for variceal bleeding s/p first banding and here for f/u egd and possible repeat banding   Past Medical History:  Diagnosis Date  . Alcohol abuse   . Cirrhosis (HCC)   . Hypertension   . Portal hypertension (HCC)-colopathy   . Rhabdomyolysis 04/23/2016  . Secondary esophageal varices without bleeding [facility]     Past Surgical History:  Procedure Laterality Date  . COLONOSCOPY WITH PROPOFOL N/A 03/19/2020   Procedure: COLONOSCOPY WITH PROPOFOL;  Surgeon: Iva Boop, MD;  Location: [facility] ENDOSCOPY;  Service: Endoscopy;  Laterality: N/A;  . ESOPHAGEAL BANDING  03/19/2020   Procedure: ESOPHAGEAL BANDING;  Surgeon: Iva Boop, MD;  Location: [facility] ENDOSCOPY;  Service: Endoscopy;;  . ESOPHAGOGASTRODUODENOSCOPY (EGD) WITH PROPOFOL N/A 03/19/2020   Procedure: ESOPHAGOGASTRODUODENOSCOPY (EGD) WITH PROPOFOL;  Surgeon: Iva Boop, MD;  Location: [facility] ENDOSCOPY;  Service: Endoscopy;  Laterality: N/A;    Prior to Admission medications   Medication Sig Start Date End Date Taking? Authorizing Provider  folic acid (FOLVITE) 1 MG tablet Take 1 tablet (1 mg total) by mouth daily. 03/21/20  Yes [name], Randa Evens, DO  Multiple Vitamin (MULTIVITAMIN WITH MINERALS) TABS tablet Take 1 tablet by mouth daily. 03/21/20  Yes [name], Randa Evens, DO  nadolol (CORGARD) 20 MG tablet Take 1 tablet (20 mg total) by mouth daily. 03/27/20 06/25/20 Yes Sagardia, Eilleen Kempf, MD  pantoprazole (PROTONIX) 20 MG tablet Take 1 tablet (20 mg total) by mouth daily. 03/13/20  Yes Peyton Najjar, MD  tamsulosin (FLOMAX) 0.4 MG CAPS capsule Take 1 capsule (0.4 mg total) by mouth daily. 06/18/16   Trixie Dredge, PA-C  thiamine 100 MG tablet Take 1 tablet (100 mg  total) by mouth daily. 03/21/20   [name], Randa Evens, DO    Current Facility-Administered Medications  Medication Dose Route Frequency Provider Last Rate Last Admin  . 0.9 %  sodium chloride infusion   Intravenous Continuous Iva Boop, MD      . lactated ringers infusion   Intravenous Continuous Iva Boop, MD 125 mL/hr at 04/07/20 0725 1,000 mL at 04/07/20 0725    Allergies as of 03/21/2020  . (No Known Allergies)    Family History  Problem Relation Age of Onset  . Diabetes Mother     Social History   Social History Narrative   Single   Works for a tree [employer]   Alcohol abuse hx   Never smoker, no drugs     Review of Systems:  All other review of systems negative except as mentioned in the HPI.  Physical Exam: Vital signs in last 24 hours: Temp:  [98.2 F (36.8 C)] 98.2 F (36.8 C) (08/02 0721) Pulse Rate:  [60] 60 (08/02 0721) Resp:  [17] 17 (08/02 0721) BP: (146)/(80) 146/80 (08/02 0721) SpO2:  [100 %] 100 % (08/02 0721) Weight:  [68 kg] 68 kg (08/02 0721)   General:   Alert,  Well-developed, well-nourished, pleasant and cooperative in NAD Lungs:  Clear throughout to auscultation.   Heart:  Regular rate and rhythm; no murmurs, clicks, rubs,  or gallops. Abdomen:  Soft, nontender and nondistended. Normal bowel sounds.   Neuro/Psych:  Alert and cooperative. Normal mood and affect. A and  O x 3   @Jeanae Whitmill  , MD, [facility] [phone] (pager) 04/07/2020 7:35 AM@

## 2020-04-07 NOTE — Anesthesia Postprocedure Evaluation (Signed)
Anesthesia Post Note  Patient: Wesley Williams  Procedure(s) Performed: ESOPHAGOGASTRODUODENOSCOPY (EGD) WITH PROPOFOL (N/A ) ESOPHAGEAL BANDING (N/A )     Patient location during evaluation: Endoscopy Anesthesia Type: MAC Level of consciousness: awake and alert Pain management: pain level controlled Vital Signs Assessment: post-procedure vital signs reviewed and stable Respiratory status: spontaneous breathing, nonlabored ventilation, respiratory function stable and patient connected to nasal cannula oxygen Cardiovascular status: blood pressure returned to baseline and stable Postop Assessment: no apparent nausea or vomiting Anesthetic complications: no   No complications documented.  Last Vitals:  Vitals:   04/07/20 0721 04/07/20 0801  BP: (!) 146/80 (!) 141/89  Pulse: 60 57  Resp: 17 18  Temp: 36.8 C 36.7 C  SpO2: 100% 100%    Last Pain:  Vitals:   04/07/20 0810  TempSrc:   PainSc: 2                  Wesley Williams L Wesley Williams

## 2020-04-07 NOTE — Op Note (Signed)
[facility] Patient Name: Wesley Williams Procedure Date: 04/07/2020 MRN: [id number] Attending MD: Iva Boop , MD Date of Birth: 09-Nov-1970 CSN: [id number] Age: 49 Admit Type: Outpatient Procedure:                Upper GI endoscopy Indications:              Esophageal varices, Follow-up of esophageal                            varices, For therapy of esophageal varices Providers:                Iva Boop, MD, Rogue Jury, RN, Arlee Muslim Tech., Technician, Kym Groom, CRNA Referring MD:              Medicines:                Propofol per Anesthesia, Monitored Anesthesia Care Complications:            No immediate complications. Estimated Blood Loss:     Estimated blood loss: none. Procedure:                Pre-Anesthesia Assessment:                           - Prior to the procedure, a History and Physical                            was performed, and patient medications and                            allergies were reviewed. The patient's tolerance of                            previous anesthesia was also reviewed. The risks                            and benefits of the procedure and the sedation                            options and risks were discussed with the patient.                            All questions were answered, and informed consent                            was obtained. Prior Anticoagulants: The patient has                            taken no previous anticoagulant or antiplatelet                            agents. ASA Grade Assessment: III - A patient with  severe systemic disease. After reviewing the risks                            and benefits, the patient was deemed in                            satisfactory condition to undergo the procedure.                           After obtaining informed consent, the endoscope was                            passed under direct vision.  Throughout the                            procedure, the patient's blood pressure, pulse, and                            oxygen saturations were monitored continuously. The                            GIF-H190 ([id number]) Olympus gastroscope was                            introduced through the mouth, and advanced to the                            prepyloric region, stomach. The upper GI endoscopy                            was accomplished without difficulty. The patient                            tolerated the procedure well. Scope In: Scope Out: Findings:      Three columns of grade II varices with stigmata of recent bleeding were       found in the distal esophagus,. They were 7 mm in largest diameter. Red       wale signs were present. Scarring from prior treatment was visible. Five       bands were successfully placed with complete eradication, resulting in       deflation of varices. There was no bleeding during and at the end of the       procedure. Estimated blood loss: none.      The stomach was normal. Impression:               - Grade II esophageal varices with stigmata of                            recent bleeding. Completely eradicated. Banded.                           - Normal stomach.                           - No specimens collected.  exam to stomach Moderate Sedation:      Not Applicable - Patient had care per Anesthesia. Recommendation:           - Patient has a contact number available for                            emergencies. The signs and symptoms of potential                            delayed complications were discussed with the                            patient. Return to normal activities tomorrow.                            Written discharge instructions were provided to the                            patient.                           - Resume previous diet.                           - Continue present medications.                           - Repeat upper  endoscopy September for retreatment.                           - Start ferrous sulfate 325 mg qd Procedure Code(s):        --- Professional ---                           [phone], Esophagoscopy, flexible, transoral; with                            band ligation of esophageal varices Diagnosis Code(s):        --- Professional ---                           I85.01, Esophageal varices with bleeding CPT copyright 2019 American Medical Association. All rights reserved. The codes documented in this report are preliminary and upon coder review may  be revised to meet current compliance requirements. Iva Boop, MD 04/07/2020 8:12:25 AM This report has been signed electronically. Number of Addenda: 0

## 2020-04-07 NOTE — Transfer of Care (Signed)
Immediate Anesthesia Transfer of Care Note  Patient: Wesley Williams  Procedure(s) Performed: ESOPHAGOGASTRODUODENOSCOPY (EGD) WITH PROPOFOL (N/A ) ESOPHAGEAL BANDING (N/A )  Patient Location: Endoscopy Unit  Anesthesia Type:MAC  Level of Consciousness: awake, drowsy and patient cooperative  Airway & Oxygen Therapy: Patient Spontanous Breathing and Patient connected to face mask oxygen  Post-op Assessment: Report given to RN and Post -op Vital signs reviewed and stable  Post vital signs: Reviewed and stable  Last Vitals:  Vitals Value Taken Time  BP    Temp    Pulse 63 04/07/20 0759  Resp 21 04/07/20 0759  SpO2 100 % 04/07/20 0759  Vitals shown include unvalidated device data.  Last Pain:  Vitals:   04/07/20 0721  TempSrc: Oral  PainSc: 0-No pain         Complications: No complications documented.

## 2020-04-07 NOTE — Discharge Instructions (Addendum)
I put rubber bands on the varices again. It went well.  You will need to do this again - my office will contact you again to set up for September.  I want you to start taking an iron pill every day - you do not need a prescription for this. It will make your stools dark, be aware. It is called ferrous sulfate and it is on the medication list. This will help your blood count improve as iron was low when in hospital.  YOU HAD AN ENDOSCOPIC PROCEDURE TODAY: Refer to the procedure report and other information in the discharge instructions given to you for any specific questions about what was found during the examination. If this information does not answer your questions, please call Dr. Marvell Fuller office at [phone] to clarify.   YOU SHOULD EXPECT: Some feelings of bloating in the abdomen. Passage of more gas than usual. Walking can help get rid of the air that was put into your GI tract during the procedure and reduce the bloating. If you had a lower endoscopy (such as a colonoscopy or flexible sigmoidoscopy) you may notice spotting of blood in your stool or on the toilet paper. Some abdominal soreness may be present for a day or two, also.  DIET: Your first meal following the procedure should be a light meal and then it is ok to progress to your normal diet. A half-sandwich or bowl of soup is an example of a good first meal. Heavy or fried foods are harder to digest and may make you feel nauseous or bloated. Drink plenty of fluids but you should avoid alcoholic beverages for 24 hours.   ACTIVITY: Your care partner should take you home directly after the procedure. You should plan to take it easy, moving slowly for the rest of the day. You can resume normal activity the day after the procedure however YOU SHOULD NOT DRIVE, use power tools, machinery or perform tasks that involve climbing or major physical exertion for 24 hours (because of the sedation medicines used during the test).   SYMPTOMS TO  REPORT IMMEDIATELY: A gastroenterologist can be reached at any hour. Please call [phone]  for any of the following symptoms:   Following upper endoscopy (EGD, EUS, ERCP, esophageal dilation) Vomiting of blood or coffee ground material  New, significant abdominal pain  New, significant chest pain or pain under the shoulder blades  Painful or persistently difficult swallowing  New shortness of breath  Black, tarry-looking or red, bloody stools (iron will make stools dark)

## 2020-04-08 ENCOUNTER — Encounter (HOSPITAL_COMMUNITY): Payer: Self-pay | Admitting: Internal Medicine

## 2020-04-30 ENCOUNTER — Telehealth: Payer: Self-pay

## 2020-04-30 ENCOUNTER — Other Ambulatory Visit: Payer: Self-pay

## 2020-04-30 DIAGNOSIS — I8510 Secondary esophageal varices without bleeding: Secondary | ICD-10-CM

## 2020-04-30 NOTE — Telephone Encounter (Signed)
Patient has been scheduled for 05/19/20 9:30 at [facility]

## 2020-04-30 NOTE — Telephone Encounter (Signed)
Patient was contacted by C. Sedano, [facility] in [place].  Instructions for procedure given to patient.  He verbalized understanding to [facility] to go for his COVID screen 9/9 and to arrive at [facility] endo at 8:00 for 9:30 and to be NPO after midnight.

## 2020-04-30 NOTE — Telephone Encounter (Signed)
-----   Message from Iva Boop, MD sent at 04/29/2020  1:20 PM EDT ----- Regarding: FW: next egd Looks like I just need to do on my next available hosp  day   He would be priority 2 I think   need a spanish speaker to call him   ----- Message ----- From: Iva Boop, MD Sent: 04/07/2020   9:18 AM EDT To: Annett Fabian, RN, Iva Boop, MD Subject: next egd                                       My procedure report says sept but I am going to see if I can do in August  Will let you know

## 2020-05-07 ENCOUNTER — Other Ambulatory Visit: Payer: Self-pay

## 2020-05-07 ENCOUNTER — Encounter (HOSPITAL_COMMUNITY): Payer: Self-pay | Admitting: Internal Medicine

## 2020-05-15 ENCOUNTER — Other Ambulatory Visit (HOSPITAL_COMMUNITY)
Admission: RE | Admit: 2020-05-15 | Discharge: 2020-05-15 | Disposition: A | Discharge status: Home / Self Care | Payer: HRSA Program | Source: Ambulatory Visit | Attending: Gastroenterology | Admitting: Gastroenterology

## 2020-05-15 DIAGNOSIS — Z20822 Contact with and (suspected) exposure to covid-19: Secondary | ICD-10-CM | POA: Diagnosis not present

## 2020-05-15 DIAGNOSIS — Z01812 Encounter for preprocedural laboratory examination: Secondary | ICD-10-CM | POA: Insufficient documentation

## 2020-05-15 LAB — SARS CORONAVIRUS 2 (TAT 6-24 HRS): SARS Coronavirus 2: NEGATIVE

## 2020-05-16 NOTE — Progress Notes (Signed)
Pre-op call complete for endo procedure Monday 9/13. Patient states he has been quarantined since covid test, will be NPO at midnight before, and has a driver taking him home post procedure. All questions addressed.

## 2020-05-19 ENCOUNTER — Encounter (HOSPITAL_COMMUNITY)
Admission: RE | Disposition: A | Discharge status: Home / Self Care | Payer: Self-pay | Source: Home / Self Care | Attending: Gastroenterology

## 2020-05-19 ENCOUNTER — Other Ambulatory Visit: Payer: Self-pay

## 2020-05-19 ENCOUNTER — Ambulatory Visit (HOSPITAL_COMMUNITY): Payer: Self-pay | Admitting: Certified Registered Nurse Anesthetist

## 2020-05-19 ENCOUNTER — Encounter (HOSPITAL_COMMUNITY): Payer: Self-pay | Admitting: Gastroenterology

## 2020-05-19 ENCOUNTER — Ambulatory Visit (HOSPITAL_COMMUNITY)
Admission: RE | Admit: 2020-05-19 | Discharge: 2020-05-19 | Disposition: A | Discharge status: Home / Self Care | Payer: Self-pay | Attending: Gastroenterology | Admitting: Gastroenterology

## 2020-05-19 DIAGNOSIS — I8510 Secondary esophageal varices without bleeding: Secondary | ICD-10-CM

## 2020-05-19 DIAGNOSIS — I10 Essential (primary) hypertension: Secondary | ICD-10-CM | POA: Insufficient documentation

## 2020-05-19 DIAGNOSIS — K7030 Alcoholic cirrhosis of liver without ascites: Secondary | ICD-10-CM | POA: Insufficient documentation

## 2020-05-19 DIAGNOSIS — Z79899 Other long term (current) drug therapy: Secondary | ICD-10-CM | POA: Insufficient documentation

## 2020-05-19 DIAGNOSIS — K766 Portal hypertension: Secondary | ICD-10-CM | POA: Insufficient documentation

## 2020-05-19 DIAGNOSIS — K2970 Gastritis, unspecified, without bleeding: Secondary | ICD-10-CM | POA: Insufficient documentation

## 2020-05-19 DIAGNOSIS — Z791 Long term (current) use of non-steroidal anti-inflammatories (NSAID): Secondary | ICD-10-CM | POA: Insufficient documentation

## 2020-05-19 HISTORY — PX: ESOPHAGOGASTRODUODENOSCOPY (EGD) WITH PROPOFOL: SHX5813

## 2020-05-19 HISTORY — PX: ESOPHAGEAL BANDING: SHX5518

## 2020-05-19 SURGERY — ESOPHAGOGASTRODUODENOSCOPY (EGD) WITH PROPOFOL
Anesthesia: Monitor Anesthesia Care

## 2020-05-19 MED ORDER — ONDANSETRON HCL 4 MG/2ML IJ SOLN
INTRAMUSCULAR | Status: DC | PRN
  Administered 2020-05-19: 4 mg via INTRAVENOUS

## 2020-05-19 MED ORDER — PROPOFOL 1000 MG/100ML IV EMUL
INTRAVENOUS | Status: AC
  Filled 2020-05-19: qty 100

## 2020-05-19 MED ORDER — SODIUM CHLORIDE 0.9 % IV SOLN: INTRAVENOUS | Status: DC

## 2020-05-19 MED ORDER — PROPOFOL 10 MG/ML IV BOLUS
INTRAVENOUS | Status: DC | PRN
  Administered 2020-05-19: 20 mg via INTRAVENOUS
  Administered 2020-05-19: 10 mg via INTRAVENOUS

## 2020-05-19 MED ORDER — LIDOCAINE 2% (20 MG/ML) 5 ML SYRINGE
INTRAMUSCULAR | Status: DC | PRN
  Administered 2020-05-19: 60 mg via INTRAVENOUS

## 2020-05-19 MED ORDER — LACTATED RINGERS IV SOLN
INTRAVENOUS | Status: DC
  Administered 2020-05-19: 1000 mL via INTRAVENOUS

## 2020-05-19 MED ORDER — PROPOFOL 500 MG/50ML IV EMUL
INTRAVENOUS | Status: AC
  Filled 2020-05-19: qty 50

## 2020-05-19 MED ORDER — PROPOFOL 500 MG/50ML IV EMUL
INTRAVENOUS | Status: DC | PRN
  Administered 2020-05-19: 125 ug/kg/min via INTRAVENOUS

## 2020-05-19 SURGICAL SUPPLY — 14 items

## 2020-05-19 NOTE — Transfer of Care (Signed)
Immediate Anesthesia Transfer of Care Note  Patient: Wesley Williams  Procedure(s) Performed: ESOPHAGOGASTRODUODENOSCOPY (EGD) WITH PROPOFOL (N/A ) ESOPHAGEAL BANDING (N/A )  Patient Location: PACU and Endoscopy Unit  Anesthesia Type:MAC  Level of Consciousness: awake, alert  and oriented  Airway & Oxygen Therapy: Patient Spontanous Breathing and Patient connected to face mask oxygen  Post-op Assessment: Report given to RN and Post -op Vital signs reviewed and stable  Post vital signs: Reviewed and stable  Last Vitals:  Vitals Value Taken Time  BP 156/100 05/19/20 1010  Temp    Pulse 70 05/19/20 1010  Resp 23 05/19/20 1010  SpO2 100 % 05/19/20 1010  Vitals shown include unvalidated device data.  Last Pain:  Vitals:   05/19/20 0852  TempSrc:   PainSc: 0-No pain         Complications: No complications documented.

## 2020-05-19 NOTE — Discharge Instructions (Signed)
YOU HAD AN ENDOSCOPIC PROCEDURE TODAY: Refer to the procedure report and other information in the discharge instructions given to you for any specific questions about what was found during the examination. If this information does not answer your questions, please call [place] office at [phone] to clarify.   YOU SHOULD EXPECT: Some feelings of bloating in the abdomen. Passage of more gas than usual. Walking can help get rid of the air that was put into your GI tract during the procedure and reduce the bloating. If you had a lower endoscopy (such as a colonoscopy or flexible sigmoidoscopy) you may notice spotting of blood in your stool or on the toilet paper. Some abdominal soreness may be present for a day or two, also.  DIET: Your first meal following the procedure should be a light meal and then it is ok to progress to your normal diet. A half-sandwich or bowl of soup is an example of a good first meal. Heavy or fried foods are harder to digest and may make you feel nauseous or bloated. Drink plenty of fluids but you should avoid alcoholic beverages for 24 hours. If you had a esophageal dilation, please see attached instructions for diet.    ACTIVITY: Your care partner should take you home directly after the procedure. You should plan to take it easy, moving slowly for the rest of the day. You can resume normal activity the day after the procedure however YOU SHOULD NOT DRIVE, use power tools, machinery or perform tasks that involve climbing or major physical exertion for 24 hours (because of the sedation medicines used during the test).   SYMPTOMS TO REPORT IMMEDIATELY: A gastroenterologist can be reached at any hour. Please call [phone]  for any of the following symptoms:   Following upper endoscopy (EGD, EUS, ERCP, esophageal dilation) Vomiting of blood or coffee ground material  New, significant abdominal pain  New, significant chest pain or pain under the shoulder blades  Painful or  persistently difficult swallowing  New shortness of breath  Black, tarry-looking or red, bloody stools  FOLLOW UP:  If any biopsies were taken you will be contacted by phone or by letter within the next 1-3 weeks. Call [phone]  if you have not heard about the biopsies in 3 weeks.  Please also call with any specific questions about appointments or follow up tests.  

## 2020-05-19 NOTE — Op Note (Addendum)
[facility] Name: Wesley LopesJose Helmers Procedure Date: 05/19/2020 MRN: [id number] Attending MD: Lynann Bolognaajesh Rahkim Rabalais , MD Date of Birth: 03-14-1971 CSN: [id number] Age: [age] Admit Type: Outpatient Procedure:                Upper GI endoscopy Indications:              Follow-up of esophageal varices, s/p EVL 02/11/2020 Providers:                Lynann Bolognaajesh Stancil Deisher, MD, Blenda MountsZharia Burton, RN, Sunday CornFaustina                            Mbumina, Technician Referring MD:              Medicines:                Monitored Anesthesia Care Complications:            No immediate complications. Estimated Blood Loss:     Estimated blood loss: none. Procedure:                Pre-Anesthesia Assessment:                           - Prior to the procedure, a History and Physical                            was performed, and patient medications and                            allergies were reviewed. The patient's tolerance of                            previous anesthesia was also reviewed. The risks                            and benefits of the procedure and the sedation                            options and risks were discussed with the patient.                            All questions were answered, and informed consent                            was obtained. Prior Anticoagulants: The patient has                            taken no previous anticoagulant or antiplatelet                            agents. ASA Grade Assessment: III - A patient with                            severe systemic disease. After reviewing the risks  and benefits, the patient was deemed in                            satisfactory condition to undergo the procedure.                           After obtaining informed consent, the endoscope was                            passed under direct vision. Throughout the                            procedure, the patient's blood pressure, pulse, and                             oxygen saturations were monitored continuously. The                            GIF-H190 ([id number]) Olympus gastroscope was                            introduced through the mouth, and advanced to the                            second part of duodenum. The upper GI endoscopy was                            accomplished without difficulty. The patient                            tolerated the procedure well. Scope In: Scope Out: Findings:      Three columns of grade II, large (> 5 mm) varices with no bleeding and       no stigmata of recent bleeding were found in the lower third of the       esophagus,. Red wale signs were present. Five bands were successfully       placed with complete eradication, resulting in deflation of varices.       There was no bleeding at the end of the maneuver.      Localized mild inflammation was found in the gastric antrum. 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 No fundal varices.      The examined duodenum was normal. Impression:               - Grade II and large (> 5 mm) esophageal varices                            with no bleeding and no stigmata of recent                            bleeding. Completely eradicated. Banded.                           - Mild Gastritis. Moderate Sedation:      Not Applicable - Patient had care  per Anesthesia. Recommendation:           - Patient has a contact number available for                            emergencies. The signs and symptoms of potential                            delayed complications were discussed with the                            patient. Return to normal activities tomorrow.                            Written discharge instructions were provided to the                            patient.                           - Low salt diet.                           - Continue present medications.                           - Strictly no ETOH                           - No aspirin, ibuprofen, naproxen, or other                             non-steroidal anti-inflammatory drugs.                           - Return to GI clinic in 2-3 weeks inAPP clinic.                           - Recommend rpt EGD with rpt EVL (if needed) in 4-6                            weeks.                           - Continue Protonix and nadolol for now. Procedure Code(s):        --- Professional ---                           [phone], Esophagogastroduodenoscopy, flexible,                            transoral; with band ligation of esophageal/gastric                            varices Diagnosis Code(s):        --- Professional ---  I85.00, Esophageal varices without bleeding                           K29.70, Gastritis, unspecified, without bleeding CPT copyright 2019 American Medical Association. All rights reserved. The codes documented in this report are preliminary and upon coder review may  be revised to meet current compliance requirements. Lynann Bologna, MD 05/19/2020 10:26:27 AM This report has been signed electronically. Number of Addenda: 0

## 2020-05-19 NOTE — Anesthesia Postprocedure Evaluation (Signed)
Anesthesia Post Note  Patient: Wesley Williams  Procedure(s) Performed: ESOPHAGOGASTRODUODENOSCOPY (EGD) WITH PROPOFOL (N/A ) ESOPHAGEAL BANDING (N/A )     Patient location during evaluation: Endoscopy Anesthesia Type: MAC Level of consciousness: awake and alert Pain management: pain level controlled Vital Signs Assessment: post-procedure vital signs reviewed and stable Respiratory status: spontaneous breathing, nonlabored ventilation and respiratory function stable Cardiovascular status: blood pressure returned to baseline and stable Postop Assessment: no apparent nausea or vomiting Anesthetic complications: no   No complications documented.  Last Vitals:  Vitals:   05/19/20 1025 05/19/20 1030  BP: (!) 176/93 (!) 185/95  Pulse: (!) 58 60  Resp: 17 14  Temp:    SpO2: 100% 100%    Last Pain:  Vitals:   05/19/20 1030  TempSrc:   PainSc: 4                  Candra R Arrington Yohe

## 2020-05-19 NOTE — H&P (Signed)
Cross cover for Dr. 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     Previous notes were reviewed.      Referring Provider: Dr Nita Sells, resident  Primary Care Physician:  Horald Pollen, MD Primary Gastroenterologist:  unassigned    Reason for Consultation:  Anemia.  New cirrhosis.     HPI: Wesley Williams is a 49 y.o. male.  PMH Htn, not taking meds recently.  Anemia, Hgb 10.2 in 04/2016, 7.8/MCV 80 in 11/2017, 8.8 at fup 12/2016 but PMD did not investigate or refer to specialist, then pt lost to fup until last week .  ETOH abuse w symptomatic withdrawal during admission w dehydration, AKI, rhabdo 04/2016.  Leg cramps.  Rarely follows up w PMD   Seen in ED for suturing and suture removal of chain saw related lac to knee 5 weeks ago.  Some bleeding at time, not excessive, resolved after suturing.    Seen re leg cramps and abd bloating, cramping at PMD office 6/8. Rxd w Robaxin, which helped   PMD called pt yesterday and sent pt to ED for blood transfusion, Hgb 6.2.  Platelets 30 K on 7/8.  MCV 78.  Getting 2nd of 3 PRBCs now. platelets to 60, WBCs 2.6 today.   INR 1.5.   Ferritin 10, Iron 21.   t bili 3.7.  Alk phos 118.  AST/ALT 54/18. Acute Hepatitis serologies negative. Na and renal function wnl.    FOBT negative.    CTAP w contrast: Cirrhosis, portosystemic venous collaterals in the gastrohepatic and gastrosplenic ligaments and mild esophageal varices, c/w portal venous htn. Gallstones.  Aortic atherosclerosis.  No ascites.      Good appetite.  No nausea vomiting.  No increased abdominal girth.  No lower extremity edema.  No excessive or unusual bleeding or bruising.  No seizures, no dizziness, no syncope.  No shortness of breath, no weakness.  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  Reports drinking beer in AM when he  sustained chain saw injury last month.  Currently admits to drinking 2 beers at lunch and a sixpack when he gets home from work in the evening.  Lives with his girlfriend/partner of 28 years and her 61 and stepson in [place] history Mother died from complication of diabetes in her 21s.  No family history of alcoholism or liver disease.  No family history of gastrointestinal cancers, anemia.           Past Medical History:  Diagnosis Date  . Alcohol abuse   . Cirrhosis ([place])   . Hypertension     History reviewed. No pertinent surgical history.         Prior to Admission medications   Medication Sig Start Date End Date Taking? Authorizing Provider  diclofenac (VOLTAREN) 75 MG EC tablet Take 1 tablet (75 mg total) by mouth 2 (two) times daily. 03/13/20  Yes Posey Boyer, MD  methocarbamol (ROBAXIN) 500 MG tablet Take 1 in the morning, 1 in the afternoon, and 1 or 2 at bedtime for muscle relaxant as needed. 03/13/20  Yes Posey Boyer, MD  pantoprazole (PROTONIX) 20 MG tablet Take 1 tablet (20 mg total) by mouth daily. Patient taking differently: Take 20 mg by mouth daily as needed for heartburn or indigestion.  03/13/20  Yes Posey Boyer, MD  amLODipine (NORVASC) 2.5 MG tablet Take 1 tablet (2.5 mg total) by mouth daily. Patient not taking: Reported on 03/17/2020 06/18/16   Clayton Bibles, PA-C  metoprolol succinate (TOPROL XL) 25 MG 24 hr tablet Take 1 tablet (25 mg total) by mouth daily. Patient not taking: Reported on 03/17/2020 06/18/16   Clayton Bibles, PA-C  tamsulosin (FLOMAX) 0.4 MG CAPS capsule Take 1 capsule (0.4 mg total) by mouth daily. Patient not taking: Reported on 03/17/2020 06/18/16   Clayton Bibles, PA-C   Social History        Tobacco Use  . Smoking status: Never Smoker  . Smokeless tobacco: Never Used  Vaping Use  . Vaping Use: Never used  Substance Use Topics  . Alcohol use: Yes    Comment: daily  . Drug use: No   Works  for tree service  Scheduled Meds: . folic acid  1 mg Oral Daily  . metoCLOPramide (REGLAN) injection  10 mg Intravenous Once  . [START ON 03/19/2020] metoCLOPramide (REGLAN) injection  10 mg Intravenous Once  . metoprolol succinate  25 mg Oral Daily  . multivitamin with minerals  1 tablet Oral Daily  . pantoprazole  20 mg Oral Daily  . peg 3350 powder  0.5 kit Oral Once  . [START ON 03/19/2020] peg 3350 powder  0.5 kit Oral Once  . tamsulosin  0.4 mg Oral Daily  . thiamine  100 mg Oral Daily   Infusions: PRN Meds:       Allergies as of 03/17/2020  . (No Known Allergies)        REVIEW OF SYSTEMS: Constitutional: Per HPI. ENT:  No nose bleeds Pulm: No shortness of breath, no cough CV:  No palpitations, no LE edema.  No angina GU:  No hematuria, no frequency GI: No dysphagia.  Good appetite.  Stable weight.  No black or bloody stools. Heme: Per HPI. Transfusions: Never transfused before.  Currently finishing up second PRBC. Neuro:  No headaches, no peripheral tingling or numbness.  No dizziness.  No syncope. Derm:  No itching, no rash or sores.  Right knee laceration site healing nicely. Endocrine:  No sweats or chills.  No polyuria or dysuria Immunization: Has not been vaccinated for Covid, received Tdap vaccination on 02/12/2020 Travel:  None beyond local counties in last few months.    PHYSICAL EXAM: Vital signs in last 24 hours:     Vitals:   03/18/20 1430 03/18/20 1630  BP: (!) 159/85 (!) 159/84  Pulse: 65 65  Resp: 18 18  Temp: 98.4 F (36.9 C) 98.4 F (36.9 C)  SpO2:  100%   Wt Readings from Last 3 Encounters:  03/18/20 86 kg  03/13/20 90.4 kg  02/24/20 92 kg    General: Slightly jaundiced, comfortable, alert, not particularly ill-appearing. Head: No facial asymmetry or swelling.  No signs of head trauma. Eyes: Slightly icteric sclera.  No conjunctival pallor.  EOMI. Ears: Good hearing Nose: No congestion or discharge Mouth:  Oropharynx moist, pink, clear.  Tongue midline.  Good dentition. Neck: No JVD, no masses, no thyromegaly Lungs: Clear bilaterally.  No labored breathing or cough Heart: RRR.  No MRG.  S1, S2 present Abdomen: Soft, nontender, nondistended.  Active bowel sounds.  Liver margin palpable just below costovertebral angle..   Rectal: Deferred Musc/Skeltl: Laceration scar healing nicely on right knee. Extremities: No CCE.  Feet are warm.  Good reperfusion in toes and fingers. Neurologic: No tremors.  No asterixis.  Moves all 4 limbs without weakness.  Alert and oriented x3.  Good historian. Skin: Jaundice not appreciated but he is quite tanned and has a olive complexion which might mask subtle jaundice. Tattoos: None observed Nodes: No cervical adenopathy. Psych: Calm, cooperative, pleasant.  Intake/Output from previous day: 07/12 0701 - 07/13 0700 In: 345 [I.V.:30; Blood:315] Out: -  Intake/Output this shift: Total I/O In: 1227.5 [P.O.:820; I.V.:40; Blood:367.5] Out: -   LAB RESULTS: Recent Labs (last 2 labs)       Recent Labs    03/17/20 1157 03/18/20 0240  WBC 3.6* 2.6*  HGB 6.5* 6.5*  HCT 24.2* 23.1*  PLT 80* 60*     BMET Recent Labs       Lab Results  Component Value Date   NA 138 03/18/2020   NA 137 03/17/2020   NA 135 03/13/2020   K 3.7 03/18/2020   K 3.5 03/17/2020   K 4.6 03/13/2020   CL 110 03/18/2020   CL 107 03/17/2020   CL 100 03/13/2020   CO2 19 (L) 03/18/2020   CO2 19 (L) 03/17/2020   CO2 22 03/13/2020   GLUCOSE 89 03/18/2020   GLUCOSE 126 (H) 03/17/2020   GLUCOSE 118 (H) 03/13/2020   BUN 14 03/18/2020   BUN 16 03/17/2020   BUN 12 03/13/2020   CREATININE 0.67 03/18/2020   CREATININE 1.09 03/17/2020   CREATININE 0.65 (L) 03/13/2020   CALCIUM 8.4 (L) 03/18/2020   CALCIUM 8.7 (L) 03/17/2020   CALCIUM 8.7 03/13/2020     LFT Recent Labs (last 2 labs)       Recent Labs    03/17/20 1157 03/17/20 1516  PROT 8.9* 8.7*   ALBUMIN 2.6* 2.5*  AST 53* 45*  ALT 22 23  ALKPHOS 88 83  BILITOT 3.6* 3.0*  BILIDIR  --  1.4*  IBILI  --  1.6*     PT/INR Recent Labs       Lab Results  Component Value Date   INR 1.5 (H) 03/18/2020   INR 1.5 (H) 03/17/2020     Hepatitis Panel Recent Labs (last 2 labs)      Recent Labs    03/17/20 2241  HEPBSAG NON REACTIVE  HCVAB NON REACTIVE  HEPAIGM NON REACTIVE  HEPBIGM NON REACTIVE     C-Diff Recent Labs  No components found for: CDIFF   Lipase  Labs (Brief)  No results found for: LIPASE    Drugs of Abuse  Labs (Brief)          Component Value Date/Time   LABOPIA NONE DETECTED 03/17/2020 1955   COCAINSCRNUR NONE DETECTED 03/17/2020 1955   LABBENZ NONE DETECTED 03/17/2020 1955   AMPHETMU NONE DETECTED 03/17/2020 1955   THCU NONE DETECTED 03/17/2020 1955   LABBARB NONE DETECTED 03/17/2020 1955       RADIOLOGY STUDIES:  Imaging Results (Last 48 hours)  CT ABDOMEN PELVIS W WO CONTRAST  Result Date: 03/17/2020 CLINICAL DATA:  Jaundice.  Fatigue.  Decreased hemoglobin. EXAM: CT ABDOMEN AND PELVIS WITHOUT AND WITH CONTRAST TECHNIQUE: Multidetector CT imaging of the abdomen and pelvis was performed following the standard protocol before and following the bolus administration of intravenous contrast. CONTRAST:  177m OMNIPAQUE IOHEXOL 300 MG/ML  SOLN COMPARISON:  None. FINDINGS: Lower Chest: No acute findings. Hepatobiliary: Advanced hepatic cirrhosis is demonstrated. No hepatic masses are identified. Portal and hepatic veins are patent. Recanalization of paraumbilical veins is consistent with portal venous hypertension. Tiny calcified gallstones are noted, however there is no evidence of acute cholecystitis or biliary ductal  dilatation. Pancreas:  No mass or inflammatory changes. Spleen: Mild-to-moderate splenomegaly, consistent with portal venous hypertension. No splenic masses identified. Adrenals/Urinary Tract: No masses identified. No  evidence of ureteral calculi or hydronephrosis. Stomach/Bowel: No evidence of obstruction, inflammatory process or abnormal fluid collections. Normal appendix visualized. Vascular/Lymphatic: No pathologically enlarged lymph nodes. No abdominal aortic aneurysm. Aortic atherosclerosis noted. Reproductive:  No mass or other significant abnormality. Other: Portosystemic venous collaterals in the gastrohepatic and gastrosplenic ligaments are seen as well as mild esophageal varices, consistent with portal venous hypertension. No evidence of ascites. Musculoskeletal:  No suspicious bone lesions identified. IMPRESSION: Hepatic cirrhosis and findings of portal venous hypertension. No radiographic evidence of neoplasm or biliary obstruction. Cholelithiasis. No radiographic evidence of cholecystitis. Aortic Atherosclerosis (ICD10-I70.0). Electronically Signed   By: Marlaine Hind M.D.   On: 03/17/2020 19:54   DG Chest 2 View  Result Date: 03/17/2020 CLINICAL DATA:  Anemia, fatigue, shortness of breath. EXAM: CHEST - 2 VIEW COMPARISON:  None. FINDINGS: The heart size and mediastinal contours are within normal limits. Both lungs are clear. The visualized skeletal structures are unremarkable. IMPRESSION: No active cardiopulmonary disease. Electronically Signed   By: Titus Dubin M.D.   On: 03/17/2020 13:43      IMPRESSION:   *    Iron def anemia.  FOBT negative on 1 of 1 assays.  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  off   PLAN:     *    Patient understands that he needs total abstinence from alcohol, in his case beer, for the rest of his life.  Pt is aware of this, has been reading about cirrhosis on his phone.    *    Advised resident to hold off on transfusing 3rd unit of blood.  *   EGD and colonoscopy, Thursday.  Start clears tmrw and split prep tmrw PM.  Watch for ETOH withdrawal,  currently asymmptomatic from that standpoint.   Needs variceal screening, screening colonoscopy in 49 y/o as well as diagnostic studies for investigation as to potential GI sources of anemia  *   CBC in AM and after transfusion tonight.     Azucena Freed, PA-C  03/18/2020, 5:35 PM Phone [phone]    [facility] Attending   I have taken an interval history, reviewed the chart and examined the patient. I agree with the Advanced Practitioner's note, impression and recommendations.     After I talked with him sounds like minimal EtOH in past week so I am thinking w/drawl unlikely so will try to do egd and colonoscopy tomorrow PM   Orders written  The risks and benefits as well as alternatives of endoscopic procedure(s) have been discussed and reviewed. All questions answered. The patient agrees to proceed.   This must be EtOH in origin but ? If other underlying conditions so will do a few more labs - alpha-1-antitrypsin, ANA, anti-smooth mm and mitochoncrial Abs to be more certain  [name], MD, [facility]

## 2020-05-19 NOTE — Anesthesia Preprocedure Evaluation (Addendum)
Anesthesia Evaluation  Patient identified by MRN, date of birth, ID band Patient awake    Reviewed: Allergy & Precautions, NPO status , Patient's Chart, lab work & pertinent test results  Airway Mallampati: I  TM Distance: >3 FB Neck ROM: Full    Dental  (+) Poor Dentition, Missing, Dental Advisory Given,  Multiple missing teeth. One upper molar is loose:   Pulmonary neg pulmonary ROS,    Pulmonary exam normal breath sounds clear to auscultation       Cardiovascular hypertension, Normal cardiovascular exam Rhythm:Regular Rate:Normal  TTE 2017 - Left ventricle: The cavity size was normal. Wall thickness wasincreased in a pattern of mild LVH. Systolic function was normal. The estimated ejection fraction was in the range of 60% to 65%. Wall motion was normal; there were no regional wall motionabnormalities. Left ventricular diastolic function parameterswere normal.  - Aortic valve: Moderately calcified annulus. Trileaflet;  moderately thickened leaflets. Valve area (VTI): 2.39 cm^2. Valve area (Vmax): 2.26 cm^2. Valve area (Vmean): 2.4 cm^2.  - Mitral valve: Mildly calcified annulus. Mildly thickened leaflets. There was mild regurgitation.  - Left atrium: The atrium was severely dilated.  - Atrial septum: No defect or patent foramen ovale was identified.  - Pulmonary arteries: Systolic pressure was moderately increased. PA peak pressure: 35 mm Hg (S).  - Technically adequate study   Neuro/Psych  Headaches, negative psych ROS   GI/Hepatic negative GI ROS, (+) Cirrhosis   Esophageal Varices  substance abuse  alcohol use,   Endo/Other  negative endocrine ROS  Renal/GU negative Renal ROS  negative genitourinary   Musculoskeletal negative musculoskeletal ROS (+)   Abdominal   Peds  Hematology  (+) Blood dyscrasia, anemia ,   Anesthesia Other Findings   Reproductive/Obstetrics                             Anesthesia Physical  Anesthesia Plan  ASA: III  Anesthesia Plan: MAC   Post-op Pain Management:    Induction: Intravenous  PONV Risk Score and Plan: 1 and Propofol infusion and Treatment may vary due to age or medical condition  Airway Management Planned: Natural Airway, Nasal Cannula and Simple Face Mask  Additional Equipment:   Intra-op Plan:   Post-operative Plan:   Informed Consent: I have reviewed the patients History and Physical, chart, labs and discussed the procedure including the risks, benefits and alternatives for the proposed anesthesia with the patient or authorized representative who has indicated his/her understanding and acceptance.     Dental advisory given  Plan Discussed with: CRNA and Anesthesiologist  Anesthesia Plan Comments:         Anesthesia Quick Evaluation

## 2020-05-19 NOTE — Anesthesia Procedure Notes (Signed)
Procedure Name: MAC Date/Time: 05/19/2020 9:43 AM Performed by: Maxwell Caul, CRNA Pre-anesthesia Checklist: Patient identified, Emergency Drugs available and Suction available Oxygen Delivery Method: Simple face mask

## 2020-05-21 ENCOUNTER — Encounter (HOSPITAL_COMMUNITY): Payer: Self-pay | Admitting: Gastroenterology

## 2020-06-26 ENCOUNTER — Ambulatory Visit: Payer: Self-pay | Admitting: Emergency Medicine

## 2020-07-22 ENCOUNTER — Other Ambulatory Visit: Payer: Self-pay

## 2020-07-22 ENCOUNTER — Encounter: Payer: Self-pay | Admitting: Emergency Medicine

## 2020-07-22 ENCOUNTER — Ambulatory Visit (INDEPENDENT_AMBULATORY_CARE_PROVIDER_SITE_OTHER): Payer: Self-pay | Admitting: Emergency Medicine

## 2020-07-22 VITALS — BP 128/69 | HR 68 | Temp 98.5°F | Resp 16 | Ht 65.0 in | Wt 194.0 lb

## 2020-07-22 DIAGNOSIS — R252 Cramp and spasm: Secondary | ICD-10-CM

## 2020-07-22 DIAGNOSIS — N529 Male erectile dysfunction, unspecified: Secondary | ICD-10-CM

## 2020-07-22 DIAGNOSIS — I8510 Secondary esophageal varices without bleeding: Secondary | ICD-10-CM

## 2020-07-22 DIAGNOSIS — Z6832 Body mass index (BMI) 32.0-32.9, adult: Secondary | ICD-10-CM

## 2020-07-22 DIAGNOSIS — K7030 Alcoholic cirrhosis of liver without ascites: Secondary | ICD-10-CM

## 2020-07-22 MED ORDER — SILDENAFIL CITRATE 100 MG PO TABS: 50.0000 mg | ORAL_TABLET | Freq: Every day | ORAL | 11 refills | Status: DC | PRN

## 2020-07-22 MED ORDER — NADOLOL 20 MG PO TABS: 20.0000 mg | ORAL_TABLET | Freq: Every day | ORAL | 3 refills | Status: DC

## 2020-07-22 NOTE — Progress Notes (Signed)
Wesley Williams 49 y.o.   Chief Complaint  Patient presents with  . Cirrhosis    follow up 3 months and chronic medical conditions  . Leg Pain    per patient both for 3 weeks he need something for pain    HISTORY OF PRESENT ILLNESS: This is a 49 y.o. male with history of alcoholic liver cirrhosis here for follow-up. Doing well. Not drinking. Working with occasional pains to shoulders and legs. No other complaints or medical concerns today. Presently taking iron, folic acid, and nadolol 20 mg daily. Needs refill.  HPI   Prior to Admission medications   Medication Sig Start Date End Date Taking? Authorizing Provider  ferrous sulfate 325 (65 FE) MG tablet Take 1 tablet (325 mg total) by mouth daily with breakfast. 04/07/20 04/07/21 Yes Iva Boop, MD  folic acid (FOLVITE) 1 MG tablet Take 1 tablet (1 mg total) by mouth daily. Patient taking differently: Take 800 mcg by mouth daily.  03/21/20  Yes [name], Randa Evens, DO  Multiple Vitamin (MULTIVITAMIN WITH MINERALS) TABS tablet Take 1 tablet by mouth daily. Patient not taking: Reported on 07/22/2020 03/21/20   [name], Randa Evens, DO  nadolol (CORGARD) 20 MG tablet Take 1 tablet (20 mg total) by mouth daily. 03/27/20 06/25/20  Georgina Quint, MD  pantoprazole (PROTONIX) 20 MG tablet Take 1 tablet (20 mg total) by mouth daily. Patient not taking: Reported on 07/22/2020 03/13/20   Peyton Najjar, MD  thiamine 100 MG tablet Take 1 tablet (100 mg total) by mouth daily. Patient not taking: Reported on 07/22/2020 03/21/20   [name], Randa Evens, DO    No Known Allergies  Patient Active Problem List   Diagnosis Date Noted  . Secondary esophageal varices without bleeding (HCC)   . Rectal varices   . Portal hypertension (HCC)-colopathy   . Alcoholic cirrhosis of liver without ascites (HCC) 03/18/2020  . Jaundice   . Pancytopenia (HCC)   . Anemia 12/12/2017  . Erectile dysfunction 12/12/2017  . Nonintractable headache 11/26/2017  .  Urinary pain 11/26/2017  . Syncope and collapse 04/23/2016  . Alcohol abuse 04/23/2016    Past Medical History:  Diagnosis Date  . Alcohol abuse   . Cirrhosis (HCC)   . Hypertension   . Portal hypertension (HCC)-colopathy   . Rhabdomyolysis 04/23/2016  . Secondary esophageal varices without bleeding [facility])     Past Surgical History:  Procedure Laterality Date  . COLONOSCOPY WITH PROPOFOL N/A 03/19/2020   Procedure: COLONOSCOPY WITH PROPOFOL;  Surgeon: Iva Boop, MD;  Location: [facility] ENDOSCOPY;  Service: Endoscopy;  Laterality: N/A;  . ESOPHAGEAL BANDING  03/19/2020   Procedure: ESOPHAGEAL BANDING;  Surgeon: Iva Boop, MD;  Location: [facility] ENDOSCOPY;  Service: Endoscopy;;  . ESOPHAGEAL BANDING N/A 04/07/2020   Procedure: ESOPHAGEAL BANDING;  Surgeon: Iva Boop, MD;  Location: [facility] ENDOSCOPY;  Service: Endoscopy;  Laterality: N/A;  . ESOPHAGEAL BANDING N/A 05/19/2020   Procedure: ESOPHAGEAL BANDING;  Surgeon: Lynann Bologna, MD;  Location: [facility] ENDOSCOPY;  Service: Endoscopy;  Laterality: N/A;  . ESOPHAGOGASTRODUODENOSCOPY (EGD) WITH PROPOFOL N/A 03/19/2020   Procedure: ESOPHAGOGASTRODUODENOSCOPY (EGD) WITH PROPOFOL;  Surgeon: Iva Boop, MD;  Location: [facility] ENDOSCOPY;  Service: Endoscopy;  Laterality: N/A;  . ESOPHAGOGASTRODUODENOSCOPY (EGD) WITH PROPOFOL N/A 04/07/2020   Procedure: ESOPHAGOGASTRODUODENOSCOPY (EGD) WITH PROPOFOL;  Surgeon: Iva Boop, MD;  Location: [facility] ENDOSCOPY;  Service: Endoscopy;  Laterality: N/A;  . ESOPHAGOGASTRODUODENOSCOPY (EGD) WITH PROPOFOL N/A 05/19/2020   Procedure: ESOPHAGOGASTRODUODENOSCOPY (EGD) WITH PROPOFOL;  Surgeon:  Lynann Bologna, MD;  Location: [facility] ENDOSCOPY;  Service: Endoscopy;  Laterality: N/A;    Social History   Socioeconomic History  . Marital status: Single    Spouse name: Not on file  . Number of children: Not on file  . Years of education: Not on file  . Highest education level: Not on file  Occupational History  . Not on file    Tobacco Use  . Smoking status: Never Smoker  . Smokeless tobacco: Never Used  Vaping Use  . Vaping Use: Never used  Substance and Sexual Activity  . Alcohol use: Yes    Comment: daily  . Drug use: No  . Sexual activity: Yes  Other Topics Concern  . Not on file  Social History Narrative   Single   Works for a tree service   Alcohol abuse hx   Never smoker, no drugs   Social Determinants of Corporate investment banker Strain:   . Difficulty of Paying Living Expenses: Not on file  Food Insecurity:   . Worried About Programme researcher, broadcasting/film/video in the Last Year: Not on file  . Ran Out of Food in the Last Year: Not on file  Transportation Needs:   . Lack of Transportation (Medical): Not on file  . Lack of Transportation (Non-Medical): Not on file  Physical Activity:   . Days of Exercise per Week: Not on file  . Minutes of Exercise per Session: Not on file  Stress:   . Feeling of Stress : Not on file  Social Connections:   . Frequency of Communication with Friends and Family: Not on file  . Frequency of Social Gatherings with Friends and Family: Not on file  . Attends Religious Services: Not on file  . Active Member of Clubs or Organizations: Not on file  . Attends Banker Meetings: Not on file  . Marital Status: Not on file  Intimate Partner Violence:   . Fear of Current or Ex-Partner: Not on file  . Emotionally Abused: Not on file  . Physically Abused: Not on file  . Sexually Abused: Not on file    Family History  Problem Relation Age of Onset  . Diabetes Mother      Review of Systems  Constitutional: Negative.  Negative for chills and fever.  HENT: Negative.  Negative for congestion and sore throat.   Respiratory: Negative.  Negative for cough and shortness of breath.   Cardiovascular: Negative.  Negative for chest pain and palpitations.  Gastrointestinal: Negative for abdominal pain, diarrhea, nausea and vomiting.  Genitourinary: Negative.  Negative for  dysuria and hematuria.       Erectile dysfunction, requesting medication.  Musculoskeletal:       Shoulder and leg pains  Skin: Negative.  Negative for rash.  Neurological: Negative.  Negative for dizziness and headaches.  All other systems reviewed and are negative.     Today's Vitals   07/22/20 1415  BP: 128/69  Pulse: 68  Resp: 16  Temp: 98.5 F (36.9 C)  TempSrc: Temporal  SpO2: 95%  Weight: 194 lb (88 kg)  Height: 5\' 5"  (1.651 m)   Body mass index is 32.28 kg/m.  Physical Exam Vitals reviewed.  Constitutional:      Appearance: Normal appearance.  HENT:     Head: Normocephalic.  Eyes:     Extraocular Movements: Extraocular movements intact.     Conjunctiva/sclera: Conjunctivae normal.     Pupils: Pupils are equal, round, and reactive to  light.  Cardiovascular:     Rate and Rhythm: Normal rate and regular rhythm.     Pulses: Normal pulses.     Heart sounds: Normal heart sounds.  Pulmonary:     Effort: Pulmonary effort is normal.     Breath sounds: Normal breath sounds.  Abdominal:     General: There is no distension.     Palpations: Abdomen is soft.     Tenderness: There is no abdominal tenderness.  Musculoskeletal:        General: Normal range of motion.     Right shoulder: Normal.     Left shoulder: Normal.     Right elbow: Normal.     Left elbow: Normal.     Cervical back: Normal range of motion.     Right hip: Normal.     Left hip: Normal.     Right upper leg: Normal.     Left upper leg: Normal.     Right knee: Normal.     Left knee: Normal.     Right lower leg: Normal. No tenderness or bony tenderness.     Left lower leg: Normal. No tenderness or bony tenderness.  Skin:    General: Skin is warm and dry.     Capillary Refill: Capillary refill takes less than 2 seconds.  Neurological:     General: No focal deficit present.     Mental Status: He is alert and oriented to person, place, and time.  Psychiatric:        Mood and Affect: Mood normal.         Behavior: Behavior normal.      ASSESSMENT & PLAN: [name] was seen today for cirrhosis and leg pain.  Diagnoses and all orders for this visit:  Alcoholic cirrhosis of liver without ascites (HCC)  Body mass index (BMI) of 32.0-32.9 in adult  Secondary esophageal varices without bleeding (HCC) -     nadolol (CORGARD) 20 MG tablet; Take 1 tablet (20 mg total) by mouth daily.  Erectile dysfunction, unspecified erectile dysfunction type -     sildenafil (VIAGRA) 100 MG tablet; Take 0.5-1 tablets (50-100 mg total) by mouth daily as needed for erectile dysfunction.  Leg cramps    Patient Instructions       If you have lab work done today you will be contacted with your lab results within the next 2 weeks.  If you have not heard from us then please contact us. The fastest way to get your results is to register for My Chart.   IF you received an x-ray today, you will receive an invoice from [facility]. Please contact [facility] at [phone] with questions or concerns regarding your invoice.   IF you received labwork today, you will receive an invoice from [facility]. Please contact [facility] at [phone] with questions or concerns regarding your invoice.   Our billing staff will not be able to assist you with questions regarding bills from these companies.  You will be contacted with the lab results as soon as they are available. The fastest way to get your results is to activate your My Chart account. Instructions are located on the last page of this paperwork. If you have not heard from us regarding the results in 2 weeks, please contact this office.     Cirrosis Cirrhosis  La cirrosis es una lesin de largo plazo (crnica) en el hgado. El hgado es el rgano interno ms grande del cuerpo, y cumple muchas funciones. MirantEste  rgano transforma los [employer], elimina las sustancias txicas de la [place], [place] protenas importantes y absorbe  las vitaminas necesarias de los alimentos. En la cirrosis, las clulas hepticas son reemplazadas por tejido cicatricial. Esto impide que la sangre circule por el hgado, lo que dificulta el funcionamiento de este rgano. La fibrosis heptica no es reversible, pero el tratamiento puede evitar que empeore. Cules son las causas? Las causas ms comunes de la cirrosis son la hepatitisC y el consumo prolongado de alcohol. Otras causas son:  Enfermedad por hgado graso no alcohlico. Esto sucede cuando la grasa se deposita en el hgado por otras causas que no sean el alcohol.  Infeccin por hepatitisB.  Hepatitis autoinmune. En esta enfermedad, el sistema de defensa del cuerpo (sistema inmunitario) ataca por error a las clulas del hgado, lo que causa irritacin e hinchazn (inflamacin).  Enfermedades que [employer] conductos dentro del hgado.  Enfermedades hepticas hereditarias, como la hemocromatosis. Esta es una de las enfermedades hepticas hereditarias ms comunes. En esta enfermedad, depsitos de hierro se acumulan en el hgado y otros rganos.  Reacciones a determinados medicamentos a Air cabin crew, como la [place], un medicamento para Insurance underwriter.  Infecciones por parsitos. Estas incluyen la esquistosomiasis, que es una enfermedad causada por un platelminto.  Contacto prolongado con ciertas sustancias txicas. Estas sustancias txicas incluyen ciertos solventes orgnicos, tales como cloroformo y tolueno. Qu incrementa el riesgo? Es ms probable que usted sufra esta afeccin si:  Tiene ciertos tipos de hepatitis viral.  Consume alcohol en exceso, especialmente si es mujer.  Tiene sobrepeso.  Comparte agujas.  Tiene relaciones sexuales sin usar proteccin con alguien que tiene una hepatitis viral. Cules son los signos o los sntomas? Es posible que no tenga signos ni sntomas al principio. Puede que los sntomas no se manifiesten hasta que el dao heptico empiece a  Theme park manager. Entre los primeros sntomas se pueden incluir los siguientes:  Debilidad y cansancio (fatiga).  Cambios en los patrones de sueo o dificultad para dormir.  Picazn.  Dolor a Merchandiser, retail parte superior derecha del abdomen.  Adelgazamiento y prdida de masa muscular.  Nuseas.  Prdida del apetito.  Aparicin de vasos sanguneos diminutos debajo de la piel. Entre los sntomas que aparecen en una etapa posterior se pueden incluir los siguientes:  Fatiga o debilidad que [place].  Piel y ojos amarillos (ictericia).  Acumulacin de lquido en el abdomen (ascitis). Puede notar que la ropa se le aprieta alrededor de la cintura.  Aumento de [place].  Hinchazn de los pies y los tobillos (edema).  Dificultad para respirar.  Tener hematomas o hemorragias.  Vmitos de [place].  Heces con sangre o de color negro.  Confusin mental. Cmo se diagnostica? El mdico puede sospechar la presencia de cirrosis en funcin de los sntomas y la historia clnica, en especial si tiene otras enfermedades o antecedentes de consumo de alcohol. El [employer] har un examen fsico para palparle el hgado y buscar signos de cirrosis. Tambin podr realizarle otras pruebas, por ejemplo:  Anlisis de sangre para controlar: ? Si tiene hepatitis B o C. ? La funcin renal. ? La funcin heptica.  Pruebas de diagnstico por imgenes, por ejemplo: ? Health visitor (RM) o exploracin por tomografa computarizada (TC) para buscar los cambios que se observan en los casos de cirrosis [place]. ? Ecografa para determinar si el tejido heptico normal est siendo reemplazado por tejido cicatricial.  Un procedimiento en el que se [place] una aguja larga para tomar  Lauris Poag de tejido heptico para estudiarlo en un laboratorio (biopsia). La biopsia de hgado puede confirmar el diagnstico de cirrosis. Cmo se trata? El tratamiento de esta afeccin depende de la magnitud del dao heptico y  qu lo caus. Puede incluir el tratamiento de los sntomas de la cirrosis o de las causas preexistentes, a fin de Scientist, research (physical sciences) el avance del [place]. El tratamiento puede incluir lo siguiente:  Cambios de estilo de vida, como: ? Consumir una dieta saludable. Puede tener que consultar al mdico o a un especialista en alimentacin y nutricin (nutricionista) a fin de Architect de alimentacin. ? Restringir la ingesta de sal. ? Mantener un peso saludable. ? No consumir drogas o alcohol.  Tomar medicamentos para: ? Warehouse manager las infecciones del hgado u otras infecciones. ? Controlar la picazn. ? Disminuir la acumulacin de lquido. ? Reducir ciertas sustancias txicas de la sangre. ? Reducir el riesgo de hemorragia de los vasos sanguneos agrandados en el estmago o el esfago (vrices).  Trasplante de hgado. En este procedimiento, el hgado de un donante se [place] para reemplazar el hgado enfermo. Esto se realiza si la cirrosis ha ocasionado insuficiencia heptica. Otros tratamientos y procedimientos, segn los problemas que usted tenga debido a la cirrosis. Los problemas frecuentes incluyen insuficiencia renal relacionada con el hgado (sndrome hepatorenal). Siga estas indicaciones en su casa:   Tome los medicamentos solamente como se lo haya indicado el mdico. No use medicamentos que sean txicos para el hgado. Consulte al mdico antes de tomar algn medicamento nuevo, incluidos los de [place].  Descanse todo lo que sea necesario.  Mantenga una dieta bien balanceada. Solicite ms informacin al mdico o al nutricionista.  Limite el consumo de sal o de agua, si el mdico le indica que lo haga.  No beba alcohol. Esto es especialmente importante si est tomando paracetamol.  Concurra a todas las visitas de [address] se lo haya indicado el mdico. Esto es importante. Comunquese con un mdico si:  Tiene fatiga o debilidad que empeora.  Observa que se le  [employer], los pies, las piernas o la cara.  Tiene fiebre.  Pierde el apetito.  Siente nuseas o vmitos.  Tiene ictericia.  Se le forman hematomas o sangra con facilidad. Solicite ayuda inmediatamente si:  Vomita sangre de color rojo brillante o una sustancia parecida a los granos de caf.  Tiene [employer].  Nota que las heces son de color negro y de aspecto alquitranado.  Comienza a sentirse confundido.  Tiene dolor en el pecho o dificultad para respirar. Resumen  La cirrosis es una lesin crnica en el hgado. El dao heptico no puede revertirse. Las causas ms comunes son la hepatitisC y el consumo prolongado de alcohol.  Las pruebas para diagnosticar la cirrosis incluyen anlisis de [place], estudios de diagnstico por imgenes y biopsia del hgado.  El tratamiento de la afeccin incluye el tratamiento de la afeccin preexistente. Evite el consumo de alcohol, drogas, sal y los medicamentos que pueden daar el hgado.  Comunquese con su mdico si presenta ascitis, edema, ictericia, fiebre, nuseas o vmitos, formacin de hematomas o sangrado con facilidad, o empeoramiento de la fatiga. Esta informacin no tiene Theme park manager el consejo del mdico. Asegrese de hacerle al mdico cualquier pregunta que tenga. Document Revised: 12/02/2017 Document Reviewed: 09/16/2017 Elsevier Patient Education  2020 Elsevier Inc.      Edwina Barth, MD [facility]

## 2020-07-22 NOTE — Patient Instructions (Addendum)
If you have lab work done today you will be contacted with your lab results within the next 2 weeks.  If you have not heard from [place] then please contact us. The fastest way to get your results is to register for My Chart.   IF you received an x-ray today, you will receive an invoice from [facility]. Please contact [facility] at [phone] with questions or concerns regarding your invoice.   IF you received labwork today, you will receive an invoice from [facility]. Please contact [facility] at [phone] with questions or concerns regarding your invoice.   Our billing staff will not be able to assist you with questions regarding bills from these companies.  You will be contacted with the lab results as soon as they are available. The fastest way to get your results is to activate your My Chart account. Instructions are located on the last page of this paperwork. If you have not heard from [place] regarding the results in 2 weeks, please contact this office.     Cirrosis Cirrhosis  La cirrosis es una lesin de largo plazo (crnica) en el hgado. El hgado es el rgano interno ms grande del cuerpo, y cumple muchas funciones. Este rgano transforma los [employer], elimina las sustancias txicas de la [place], [place] protenas importantes y absorbe las vitaminas necesarias de los alimentos. En la cirrosis, las clulas hepticas son reemplazadas por tejido cicatricial. Esto impide que la sangre circule por el hgado, lo que dificulta el funcionamiento de este rgano. La fibrosis heptica no es reversible, pero el tratamiento puede evitar que empeore. Cules son las causas? Las causas ms comunes de la cirrosis son la hepatitisC y el consumo prolongado de alcohol. Otras causas son:  Enfermedad por hgado graso no alcohlico. Esto sucede cuando la grasa se deposita en el hgado por otras causas que no sean el alcohol.  Infeccin por hepatitisB.  Hepatitis  autoinmune. En esta enfermedad, el sistema de defensa del cuerpo (sistema inmunitario) ataca por error a las clulas del hgado, lo que causa irritacin e hinchazn (inflamacin).  Enfermedades que [employer] conductos dentro del hgado.  Enfermedades hepticas hereditarias, como la hemocromatosis. Esta es una de las enfermedades hepticas hereditarias ms comunes. En esta enfermedad, depsitos de hierro se acumulan en el hgado y otros rganos.  Reacciones a determinados medicamentos a Air cabin crew, como la [place], un medicamento para Insurance underwriter.  Infecciones por parsitos. Estas incluyen la esquistosomiasis, que es una enfermedad causada por un platelminto.  Contacto prolongado con ciertas sustancias txicas. Estas sustancias txicas incluyen ciertos solventes orgnicos, tales como cloroformo y tolueno. Qu incrementa el riesgo? Es ms probable que usted sufra esta afeccin si:  Tiene ciertos tipos de hepatitis viral.  Consume alcohol en exceso, especialmente si es mujer.  Tiene sobrepeso.  Comparte agujas.  Tiene relaciones sexuales sin usar proteccin con alguien que tiene una hepatitis viral. Cules son los signos o los sntomas? Es posible que no tenga signos ni sntomas al principio. Puede que los sntomas no se manifiesten hasta que el dao heptico empiece a Theme park manager. Entre los primeros sntomas se pueden incluir los siguientes:  Debilidad y cansancio (fatiga).  Cambios en los patrones de sueo o dificultad para dormir.  Picazn.  Dolor a Merchandiser, retail parte superior derecha del abdomen.  Adelgazamiento y prdida de masa muscular.  Nuseas.  Prdida del apetito.  Aparicin de vasos sanguneos diminutos debajo de la piel. Entre los sntomas que aparecen en Neomia Dear  etapa posterior se pueden incluir los siguientes:  Fatiga o debilidad que [place].  Piel y ojos amarillos (ictericia).  Acumulacin de lquido en el abdomen (ascitis). Puede notar que la ropa  se le aprieta alrededor de la cintura.  Aumento de [place].  Hinchazn de los pies y los tobillos (edema).  Dificultad para respirar.  Tener hematomas o hemorragias.  Vmitos de [place].  Heces con sangre o de color negro.  Confusin mental. Cmo se diagnostica? El mdico puede sospechar la presencia de cirrosis en funcin de los sntomas y la historia clnica, en especial si tiene otras enfermedades o antecedentes de consumo de alcohol. El [employer] har un examen fsico para palparle el hgado y buscar signos de cirrosis. Tambin podr realizarle otras pruebas, por ejemplo:  Anlisis de sangre para controlar: ? Si tiene hepatitis B o C. ? La funcin renal. ? La funcin heptica.  Pruebas de diagnstico por imgenes, por ejemplo: ? Health visitor (RM) o exploracin por tomografa computarizada (TC) para buscar los cambios que se observan en los casos de cirrosis [place]. ? Ecografa para determinar si el tejido heptico normal est siendo reemplazado por tejido cicatricial.  Un procedimiento en el que se [place] una aguja larga para tomar Lauris Poag de tejido heptico para estudiarlo en un laboratorio (biopsia). La biopsia de hgado puede confirmar el diagnstico de cirrosis. Cmo se trata? El tratamiento de esta afeccin depende de la magnitud del dao heptico y qu lo caus. Puede incluir el tratamiento de los sntomas de la cirrosis o de las causas preexistentes, a fin de Scientist, research (physical sciences) el avance del [place]. El tratamiento puede incluir lo siguiente:  Cambios de estilo de vida, como: ? Consumir una dieta saludable. Puede tener que consultar al mdico o a un especialista en alimentacin y nutricin (nutricionista) a fin de Architect de alimentacin. ? Restringir la ingesta de sal. ? Mantener un peso saludable. ? No consumir drogas o alcohol.  Tomar medicamentos para: ? Warehouse manager las infecciones del hgado u otras infecciones. ? Controlar la picazn. ? Disminuir  la acumulacin de lquido. ? Reducir ciertas sustancias txicas de la sangre. ? Reducir el riesgo de hemorragia de los vasos sanguneos agrandados en el estmago o el esfago (vrices).  Trasplante de hgado. En este procedimiento, el hgado de un donante se [place] para reemplazar el hgado enfermo. Esto se realiza si la cirrosis ha ocasionado insuficiencia heptica. Otros tratamientos y procedimientos, segn los problemas que usted tenga debido a la cirrosis. Los problemas frecuentes incluyen insuficiencia renal relacionada con el hgado (sndrome hepatorenal). Siga estas indicaciones en su casa:   Tome los medicamentos solamente como se lo haya indicado el mdico. No use medicamentos que sean txicos para el hgado. Consulte al mdico antes de tomar algn medicamento nuevo, incluidos los de [place].  Descanse todo lo que sea necesario.  Mantenga una dieta bien balanceada. Solicite ms informacin al mdico o al nutricionista.  Limite el consumo de sal o de agua, si el mdico le indica que lo haga.  No beba alcohol. Esto es especialmente importante si est tomando paracetamol.  Concurra a todas las visitas de [address] se lo haya indicado el mdico. Esto es importante. Comunquese con un mdico si:  Tiene fatiga o debilidad que empeora.  Observa que se le [employer], los pies, las piernas o la cara.  Tiene fiebre.  Pierde el apetito.  Siente nuseas o vmitos.  Tiene ictericia.  Se le forman hematomas o sangra con facilidad.  Solicite ayuda inmediatamente si:  Vomita sangre de color rojo brillante o una sustancia parecida a los granos de caf.  Tiene [employer].  Nota que las heces son de color negro y de aspecto alquitranado.  Comienza a sentirse confundido.  Tiene dolor en el pecho o dificultad para respirar. Resumen  La cirrosis es una lesin crnica en el hgado. El dao heptico no puede revertirse. Las causas ms comunes son la  hepatitisC y el consumo prolongado de alcohol.  Las pruebas para diagnosticar la cirrosis incluyen anlisis de [place], estudios de diagnstico por imgenes y biopsia del hgado.  El tratamiento de la afeccin incluye el tratamiento de la afeccin preexistente. Evite el consumo de alcohol, drogas, sal y los medicamentos que pueden daar el hgado.  Comunquese con su mdico si presenta ascitis, edema, ictericia, fiebre, nuseas o vmitos, formacin de hematomas o sangrado con facilidad, o empeoramiento de la fatiga. Esta informacin no tiene Theme park manager el consejo del mdico. Asegrese de hacerle al mdico cualquier pregunta que tenga. Document Revised: 12/02/2017 Document Reviewed: 09/16/2017 Elsevier Patient Education  2020 [employer].

## 2020-10-22 ENCOUNTER — Ambulatory Visit: Payer: Self-pay | Admitting: Emergency Medicine

## 2020-11-05 ENCOUNTER — Ambulatory Visit: Payer: Self-pay | Admitting: Emergency Medicine

## 2020-11-26 ENCOUNTER — Telehealth: Payer: Self-pay | Admitting: Emergency Medicine

## 2020-12-03 ENCOUNTER — Ambulatory Visit: Payer: Self-pay | Admitting: Emergency Medicine

## 2020-12-24 ENCOUNTER — Encounter: Payer: Self-pay | Admitting: Emergency Medicine

## 2020-12-24 ENCOUNTER — Other Ambulatory Visit: Payer: Self-pay

## 2020-12-24 ENCOUNTER — Ambulatory Visit (INDEPENDENT_AMBULATORY_CARE_PROVIDER_SITE_OTHER): Payer: Self-pay | Admitting: Emergency Medicine

## 2020-12-24 VITALS — BP 130/90 | HR 77 | Temp 98.0°F | Ht 65.0 in | Wt 205.0 lb

## 2020-12-24 DIAGNOSIS — K766 Portal hypertension: Secondary | ICD-10-CM

## 2020-12-24 DIAGNOSIS — D61818 Other pancytopenia: Secondary | ICD-10-CM

## 2020-12-24 DIAGNOSIS — N529 Male erectile dysfunction, unspecified: Secondary | ICD-10-CM

## 2020-12-24 DIAGNOSIS — K7030 Alcoholic cirrhosis of liver without ascites: Secondary | ICD-10-CM

## 2020-12-24 DIAGNOSIS — I8510 Secondary esophageal varices without bleeding: Secondary | ICD-10-CM

## 2020-12-24 MED ORDER — NADOLOL 20 MG PO TABS: 20.0000 mg | ORAL_TABLET | Freq: Every day | ORAL | 3 refills | Status: DC

## 2020-12-24 MED ORDER — SILDENAFIL CITRATE 100 MG PO TABS: 50.0000 mg | ORAL_TABLET | Freq: Every day | ORAL | 11 refills | Status: DC | PRN

## 2020-12-24 NOTE — Patient Instructions (Signed)
Enfermedad heptica por alcoholismo Alcoholic Liver Disease  La enfermedad heptica por alcoholismo es el dao heptico causado por el consumo de gran cantidad de alcohol durante un largo perodo de [place]. Si sufre esta enfermedad, debe dejar de beber alcohol. Cules son las causas? Esta afeccin es causada por el consumo abundante y prolongado (crnico) de alcohol. Qu incrementa el riesgo? Es ms probable que Web designer en los siguientes casos:  Mujeres.  Personas que tienen mucho sobrepeso.  Personas con antecedentes familiares de la enfermedad.  Personas que beben con frecuencia grandes cantidades de alcohol en un perodo de tiempo corto (consumo intensivo).  Personas que tienen otra enfermedad heptica.  Personas que no siguen una dieta saludable.  Personas que carecen de ciertos nutrientes en su dieta, como el folato o la tiamina. Cules son los signos o sntomas? La mayora de las personas no tienen sntomas en las etapas iniciales de esta enfermedad. Si aparecen sntomas tempranos, pueden ser los siguientes:  Prdida del apetito.  Tener ganas de vomitar, o vomitar. Los sntomas de enfermedad moderada incluyen los siguientes:  Materia fecal lquida (diarrea).  [place].  Sensacin de cansancio (fatiga).  Prdida de peso.  Coloracin amarillenta en la piel y en la parte blanca de los ojos (ictericia).  Dolor e hinchazn en el vientre (abdomen). Los sntomas de enfermedad avanzada incluyen los siguientes:  Adelgazamiento y prdida de masa muscular.  Picazn en la piel.  Aumento de lquido en el vientre (ascitis).  Hinchazn de los pies y los tobillos (edema).  Hemorragias nasales o encas sangrantes.  Dificultad para concentrarse.  Cambios en el estado de nimo o confusin. Cmo se trata? La parte ms importante del tratamiento es dejar de consumir alcohol. Tambin puede:  Tomar medicamentos para [employer] se producen al  dejar de consumir alcohol o (sndrome de abstinencia).  Entrar en un programa de tratamiento que lo ayude a dejar de beber.  Unirse a un grupo de apoyo.  Seguir una dieta saludable. Esto incluye alimentos que contengan mucho zinc o vitaminas B.  Tomar vitaminas.  Tomar medicamentos para disminuir la hinchazn (inflamacin) del hgado.  Recibir un hgado donado (trasplante de hgado). Esto solo se Sports coach graves. Es solo para las personas que han dejado de beber y pueden comprometerse a que nunca ms volvern a Media planner. Siga estas instrucciones en su casa:  No beba alcohol. Siga su plan de tratamiento. Colabore con el mdico si necesita ayuda.  Considere la posibilidad de unirse a un grupo de apoyo por alcoholismo.  Use los medicamentos de venta libre y los recetados solamente como se lo haya indicado el mdico. Incluya tambin las vitaminas.  No tome medicamentos ni consuma alimentos que contengan alcohol, a menos que su mdico le diga que es seguro.  Siga las indicaciones del mdico respecto de seguir una dieta saludable.  Cumpla con todas las visitas de seguimiento.   Dnde buscar apoyo  Alcoholics Anonymous (Alcohlicos Annimos, AA): [email or web address] Comunquese con un mdico si:  Tiene fiebre o escalofros.  Su piel comienza a ponerse ms amarillenta, plida u oscura.  Tiene dolores de [place]. Solicite ayuda de inmediato si:  Vomita sangre.  Observa sangre de color rojo brillante en las heces (materia fecal).  Su materia fecal es de color negro o similar al alquitrn.  Tiene dificultad para hacer lo siguiente: ? Razonar. ? Caminar. ? Mantener el equilibrio. ? Respirar. Estos sntomas pueden Customer service manager. Solicite ayuda de inmediato. Comunquese con el servicio  de emergencias de su localidad (911 en los [place]).  No espere a ver si los sntomas desaparecen.  No conduzca por sus propios medios [employer] hospital. Resumen  La  enfermedad heptica por alcoholismo es el dao heptico causado por el consumo de gran cantidad de alcohol durante un largo perodo de [place].  Si sufre esta enfermedad, debe dejar de beber alcohol. Siga el plan de tratamiento, y colabore con su mdico segn sea necesario.  Considere la posibilidad de unirse a un grupo de apoyo por alcoholismo. Esta informacin no tiene Theme park manager el consejo del mdico. Asegrese de hacerle al mdico cualquier pregunta que tenga. Document Revised: 06/30/2020 Document Reviewed: 06/30/2020 Elsevier Patient Education  2021 [employer].

## 2020-12-24 NOTE — Progress Notes (Signed)
Wesley Williams 50 y.o.   Chief Complaint  Patient presents with  . Medication Problem    Per patient he will talk about problem    HISTORY OF PRESENT ILLNESS: This is a 50 y.o. male with history of alcoholic liver cirrhosis needs refill on nadolol 20 mg daily. Also requesting refill of Viagra. Doing well.  No complications.  Physically active at work.  Eating better.  Not drinking any alcohol. No other complaints or medical concerns today.  HPI   Prior to Admission medications   Medication Sig Start Date End Date Taking? Authorizing Provider  ferrous sulfate 325 (65 FE) MG tablet Take 1 tablet (325 mg total) by mouth daily with breakfast. 04/07/20 04/07/21  Iva Boop, MD  folic acid (FOLVITE) 1 MG tablet Take 1 tablet (1 mg total) by mouth daily. Patient taking differently: Take 800 mcg by mouth daily.  03/21/20   [name], Randa Evens, DO  Multiple Vitamin (MULTIVITAMIN WITH MINERALS) TABS tablet Take 1 tablet by mouth daily. Patient not taking: Reported on 07/22/2020 03/21/20   [name], Randa Evens, DO  nadolol (CORGARD) 20 MG tablet Take 1 tablet (20 mg total) by mouth daily. 07/22/20 10/20/20  Georgina Quint, MD  pantoprazole (PROTONIX) 20 MG tablet Take 1 tablet (20 mg total) by mouth daily. Patient not taking: Reported on 07/22/2020 03/13/20   Peyton Najjar, MD  sildenafil (VIAGRA) 100 MG tablet Take 0.5-1 tablets (50-100 mg total) by mouth daily as needed for erectile dysfunction. 07/22/20   Georgina Quint, MD  thiamine 100 MG tablet Take 1 tablet (100 mg total) by mouth daily. Patient not taking: Reported on 07/22/2020 03/21/20   [name], Randa Evens, DO    No Known Allergies  Patient Active Problem List   Diagnosis Date Noted  . Secondary esophageal varices without bleeding (HCC)   . Rectal varices   . Portal hypertension (HCC)-colopathy   . Alcoholic cirrhosis of liver without ascites (HCC) 03/18/2020  . Pancytopenia (HCC)   . Anemia 12/12/2017  . Erectile  dysfunction 12/12/2017  . Alcohol abuse 04/23/2016    Past Medical History:  Diagnosis Date  . Alcohol abuse   . Cirrhosis (HCC)   . Hypertension   . Portal hypertension (HCC)-colopathy   . Rhabdomyolysis 04/23/2016  . Secondary esophageal varices without bleeding [facility])     Past Surgical History:  Procedure Laterality Date  . COLONOSCOPY WITH PROPOFOL N/A 03/19/2020   Procedure: COLONOSCOPY WITH PROPOFOL;  Surgeon: Iva Boop, MD;  Location: [facility] ENDOSCOPY;  Service: Endoscopy;  Laterality: N/A;  . ESOPHAGEAL BANDING  03/19/2020   Procedure: ESOPHAGEAL BANDING;  Surgeon: Iva Boop, MD;  Location: [facility] ENDOSCOPY;  Service: Endoscopy;;  . ESOPHAGEAL BANDING N/A 04/07/2020   Procedure: ESOPHAGEAL BANDING;  Surgeon: Iva Boop, MD;  Location: [facility] ENDOSCOPY;  Service: Endoscopy;  Laterality: N/A;  . ESOPHAGEAL BANDING N/A 05/19/2020   Procedure: ESOPHAGEAL BANDING;  Surgeon: Lynann Bologna, MD;  Location: [facility] ENDOSCOPY;  Service: Endoscopy;  Laterality: N/A;  . ESOPHAGOGASTRODUODENOSCOPY (EGD) WITH PROPOFOL N/A 03/19/2020   Procedure: ESOPHAGOGASTRODUODENOSCOPY (EGD) WITH PROPOFOL;  Surgeon: Iva Boop, MD;  Location: [facility] ENDOSCOPY;  Service: Endoscopy;  Laterality: N/A;  . ESOPHAGOGASTRODUODENOSCOPY (EGD) WITH PROPOFOL N/A 04/07/2020   Procedure: ESOPHAGOGASTRODUODENOSCOPY (EGD) WITH PROPOFOL;  Surgeon: Iva Boop, MD;  Location: [facility] ENDOSCOPY;  Service: Endoscopy;  Laterality: N/A;  . ESOPHAGOGASTRODUODENOSCOPY (EGD) WITH PROPOFOL N/A 05/19/2020   Procedure: ESOPHAGOGASTRODUODENOSCOPY (EGD) WITH PROPOFOL;  Surgeon: Lynann Bologna, MD;  Location: [facility] ENDOSCOPY;  Service: Endoscopy;  Laterality: N/A;    Social History   Socioeconomic History  . Marital status: Single    Spouse name: Not on file  . Number of children: Not on file  . Years of education: Not on file  . Highest education level: Not on file  Occupational History  . Not on file  Tobacco Use  . Smoking status:  Never Smoker  . Smokeless tobacco: Never Used  Vaping Use  . Vaping Use: Never used  Substance and Sexual Activity  . Alcohol use: Yes    Comment: daily  . Drug use: No  . Sexual activity: Yes  Other Topics Concern  . Not on file  Social History Narrative   Single   Works for a tree service   Alcohol abuse hx   Never smoker, no drugs   Social Determinants of Corporate investment bankerHealth   Financial Resource Strain: Not on file  Food Insecurity: Not on file  Transportation Needs: Not on file  Physical Activity: Not on file  Stress: Not on file  Social Connections: Not on file  Intimate Partner Violence: Not on file    Family History  Problem Relation Age of Onset  . Diabetes Mother      Review of Systems  Constitutional: Negative.  Negative for chills and fever.  HENT: Negative.  Negative for congestion and sore throat.   Respiratory: Negative.  Negative for cough and shortness of breath.   Cardiovascular: Negative.  Negative for chest pain and palpitations.  Gastrointestinal: Negative.  Negative for abdominal pain, blood in stool, diarrhea, melena, nausea and vomiting.  Genitourinary: Negative.  Negative for dysuria and hematuria.  Skin: Negative.  Negative for rash.  Neurological: Negative.  Negative for dizziness and headaches.  All other systems reviewed and are negative.   Today's Vitals   12/24/20 0953  BP: 130/90  Pulse: 77  Temp: 98 F (36.7 C)  TempSrc: Oral  SpO2: 99%  Weight: 205 lb (93 kg)  Height: 5\' 5"  (1.651 m)   Body mass index is 34.11 kg/m. Wt Readings from Last 3 Encounters:  12/24/20 205 lb (93 kg)  07/22/20 194 lb (88 kg)  05/19/20 190 lb (86.2 kg)    Physical Exam Vitals reviewed.  Constitutional:      Appearance: Normal appearance.  HENT:     Head: Normocephalic.  Eyes:     Extraocular Movements: Extraocular movements intact.     Conjunctiva/sclera: Conjunctivae normal.     Pupils: Pupils are equal, round, and reactive to light.   Cardiovascular:     Rate and Rhythm: Normal rate and regular rhythm.     Pulses: Normal pulses.     Heart sounds: Normal heart sounds.  Pulmonary:     Effort: Pulmonary effort is normal.     Breath sounds: Normal breath sounds.  Abdominal:     General: There is no distension.     Palpations: Abdomen is soft.     Tenderness: There is no abdominal tenderness.  Musculoskeletal:     Cervical back: Normal range of motion and neck supple.     Right lower leg: No edema.     Left lower leg: No edema.  Skin:    General: Skin is warm and dry.     Capillary Refill: Capillary refill takes less than 2 seconds.  Neurological:     General: No focal deficit present.     Mental Status: He is alert and oriented to person, place, and time.  Psychiatric:  Mood and Affect: Mood normal.        Behavior: Behavior normal.      ASSESSMENT & PLAN: Clinically stable.  No medical concerns identified during this visit. Continue present medications.  No changes. Diet and nutrition discussed. Follow-up in 6 months. Wesley Williams was seen today for medication problem.  Diagnoses and all orders for this visit:  Alcoholic cirrhosis of liver without ascites (HCC)  Secondary esophageal varices without bleeding (HCC) -     nadolol (CORGARD) 20 MG tablet; Take 1 tablet (20 mg total) by mouth daily.  Portal hypertension (HCC)-colopathy  Pancytopenia (HCC)  Erectile dysfunction, unspecified erectile dysfunction type -     sildenafil (VIAGRA) 100 MG tablet; Take 0.5-1 tablets (50-100 mg total) by mouth daily as needed for erectile dysfunction.    Patient Instructions   Enfermedad heptica por alcoholismo Alcoholic Liver Disease  La enfermedad heptica por alcoholismo es el dao heptico causado por el consumo de gran cantidad de alcohol durante un largo perodo de [place]. Si sufre esta enfermedad, debe dejar de beber alcohol. Cules son las causas? Esta afeccin es causada por el consumo abundante y  prolongado (crnico) de alcohol. Qu incrementa el riesgo? Es ms probable que Web designer en los siguientes casos:  Mujeres.  Personas que tienen mucho sobrepeso.  Personas con antecedentes familiares de la enfermedad.  Personas que beben con frecuencia grandes cantidades de alcohol en un perodo de tiempo corto (consumo intensivo).  Personas que tienen otra enfermedad heptica.  Personas que no siguen una dieta saludable.  Personas que carecen de ciertos nutrientes en su dieta, como el folato o la tiamina. Cules son los signos o sntomas? La mayora de las personas no tienen sntomas en las etapas iniciales de esta enfermedad. Si aparecen sntomas tempranos, pueden ser los siguientes:  Prdida del apetito.  Tener ganas de vomitar, o vomitar. Los sntomas de enfermedad moderada incluyen los siguientes:  Materia fecal lquida (diarrea).  Grant Ruts.  Sensacin de cansancio (fatiga).  Prdida de peso.  Coloracin amarillenta en la piel y en la parte blanca de los ojos (ictericia).  Dolor e hinchazn en el vientre (abdomen). Los sntomas de enfermedad avanzada incluyen los siguientes:  Adelgazamiento y prdida de masa muscular.  Picazn en la piel.  Aumento de lquido en el vientre (ascitis).  Hinchazn de los pies y los tobillos (edema).  Hemorragias nasales o encas sangrantes.  Dificultad para concentrarse.  Cambios en el estado de nimo o confusin. Cmo se trata? La parte ms importante del tratamiento es dejar de consumir alcohol. Tambin puede:  Tomar medicamentos para [employer] se producen al dejar de consumir alcohol o (sndrome de abstinencia).  Entrar en un programa de tratamiento que lo ayude a dejar de beber.  Unirse a un grupo de apoyo.  Seguir una dieta saludable. Esto incluye alimentos que contengan mucho zinc o vitaminas B.  Tomar vitaminas.  Tomar medicamentos para disminuir la hinchazn (inflamacin) del  hgado.  Recibir un hgado donado (trasplante de hgado). Esto solo se Sports coach graves. Es solo para las personas que han dejado de beber y pueden comprometerse a que nunca ms volvern a Media planner. Siga estas instrucciones en su casa:  No beba alcohol. Siga su plan de tratamiento. Colabore con el mdico si necesita ayuda.  Considere la posibilidad de unirse a un grupo de apoyo por alcoholismo.  Use los medicamentos de venta libre y los recetados solamente como se lo haya indicado el mdico. Incluya tambin las  vitaminas.  No tome medicamentos ni consuma alimentos que contengan alcohol, a menos que su mdico le diga que es seguro.  Siga las indicaciones del mdico respecto de seguir una dieta saludable.  Cumpla con todas las visitas de seguimiento.   Dnde buscar apoyo  Alcoholics Anonymous (Alcohlicos Annimos, AA): [email or web address] Comunquese con un mdico si:  Tiene fiebre o escalofros.  Su piel comienza a ponerse ms amarillenta, plida u oscura.  Tiene dolores de [place]. Solicite ayuda de inmediato si:  Vomita sangre.  Observa sangre de color rojo brillante en las heces (materia fecal).  Su materia fecal es de color negro o similar al alquitrn.  Tiene dificultad para hacer lo siguiente: ? Razonar. ? Caminar. ? Mantener el equilibrio. ? Respirar. Estos sntomas pueden Customer service manager. Solicite ayuda de inmediato. Comunquese con el servicio de emergencias de su localidad (911 en los [place]).  No espere a ver si los sntomas desaparecen.  No conduzca por sus propios medios [employer] hospital. Resumen  La enfermedad heptica por alcoholismo es el dao heptico causado por el consumo de gran cantidad de alcohol durante un largo perodo de [place].  Si sufre esta enfermedad, debe dejar de beber alcohol. Siga el plan de tratamiento, y colabore con su mdico segn sea necesario.  Considere la posibilidad de unirse a un grupo de apoyo por  alcoholismo. Esta informacin no tiene Theme park manager el consejo del mdico. Asegrese de hacerle al mdico cualquier pregunta que tenga. Document Revised: 06/30/2020 Document Reviewed: 06/30/2020 Elsevier Patient Education  2021 Elsevier Inc.      Edwina Barth, MD [facility]

## 2021-02-24 ENCOUNTER — Other Ambulatory Visit: Payer: Self-pay

## 2021-02-24 ENCOUNTER — Encounter (HOSPITAL_COMMUNITY): Payer: Self-pay

## 2021-02-24 ENCOUNTER — Emergency Department (HOSPITAL_COMMUNITY): Payer: Self-pay

## 2021-02-24 ENCOUNTER — Emergency Department (HOSPITAL_COMMUNITY)
Admission: EM | Admit: 2021-02-24 | Discharge: 2021-02-24 | Disposition: A | Discharge status: Home / Self Care | Payer: Self-pay | Attending: Emergency Medicine | Admitting: Emergency Medicine

## 2021-02-24 DIAGNOSIS — Z79899 Other long term (current) drug therapy: Secondary | ICD-10-CM | POA: Insufficient documentation

## 2021-02-24 DIAGNOSIS — E119 Type 2 diabetes mellitus without complications: Secondary | ICD-10-CM | POA: Insufficient documentation

## 2021-02-24 DIAGNOSIS — R531 Weakness: Secondary | ICD-10-CM | POA: Insufficient documentation

## 2021-02-24 DIAGNOSIS — Y900 Blood alcohol level of less than 20 mg/100 ml: Secondary | ICD-10-CM | POA: Insufficient documentation

## 2021-02-24 DIAGNOSIS — R112 Nausea with vomiting, unspecified: Secondary | ICD-10-CM | POA: Insufficient documentation

## 2021-02-24 DIAGNOSIS — Z20822 Contact with and (suspected) exposure to covid-19: Secondary | ICD-10-CM | POA: Insufficient documentation

## 2021-02-24 DIAGNOSIS — R61 Generalized hyperhidrosis: Secondary | ICD-10-CM | POA: Insufficient documentation

## 2021-02-24 DIAGNOSIS — I10 Essential (primary) hypertension: Secondary | ICD-10-CM | POA: Insufficient documentation

## 2021-02-24 DIAGNOSIS — K625 Hemorrhage of anus and rectum: Secondary | ICD-10-CM | POA: Insufficient documentation

## 2021-02-24 LAB — PROTIME-INR
INR: 1.5 — ABNORMAL HIGH (ref 0.8–1.2)
Prothrombin Time: 18.2 seconds — ABNORMAL HIGH (ref 11.4–15.2)

## 2021-02-24 LAB — CBC WITH DIFFERENTIAL/PLATELET
Abs Immature Granulocytes: 0.08 10*3/uL — ABNORMAL HIGH (ref 0.00–0.07)
Basophils Absolute: 0 10*3/uL (ref 0.0–0.1)
Basophils Relative: 1 %
Eosinophils Absolute: 0 10*3/uL (ref 0.0–0.5)
Eosinophils Relative: 1 %
HCT: 31.1 % — ABNORMAL LOW (ref 39.0–52.0)
Hemoglobin: 10.9 g/dL — ABNORMAL LOW (ref 13.0–17.0)
Immature Granulocytes: 1 %
Lymphocytes Relative: 9 %
Lymphs Abs: 0.5 10*3/uL — ABNORMAL LOW (ref 0.7–4.0)
MCH: 34.3 pg — ABNORMAL HIGH (ref 26.0–34.0)
MCHC: 35 g/dL (ref 30.0–36.0)
MCV: 97.8 fL (ref 80.0–100.0)
Monocytes Absolute: 0.4 10*3/uL (ref 0.1–1.0)
Monocytes Relative: 7 %
Neutro Abs: 4.9 10*3/uL (ref 1.7–7.7)
Neutrophils Relative %: 81 %
Platelets: 67 10*3/uL — ABNORMAL LOW (ref 150–400)
RBC: 3.18 MIL/uL — ABNORMAL LOW (ref 4.22–5.81)
RDW: 15.4 % (ref 11.5–15.5)
WBC: 6.1 10*3/uL (ref 4.0–10.5)
nRBC: 0 % (ref 0.0–0.2)

## 2021-02-24 LAB — URINALYSIS, ROUTINE W REFLEX MICROSCOPIC
Bacteria, UA: NONE SEEN
Bilirubin Urine: NEGATIVE
Glucose, UA: >=500 mg/dL — AB
Ketones, ur: 20 mg/dL — AB
Leukocytes,Ua: NEGATIVE
Nitrite: NEGATIVE
Protein, ur: NEGATIVE mg/dL
Specific Gravity, Urine: 1.029 (ref 1.005–1.030)
pH: 6 (ref 5.0–8.0)

## 2021-02-24 LAB — CBG MONITORING, ED
Glucose-Capillary: 433 mg/dL — ABNORMAL HIGH (ref 70–99)
Glucose-Capillary: 391 mg/dL — ABNORMAL HIGH (ref 70–99)

## 2021-02-24 LAB — COMPREHENSIVE METABOLIC PANEL
ALT: 29 U/L (ref 0–44)
AST: 50 U/L — ABNORMAL HIGH (ref 15–41)
Albumin: 2.7 g/dL — ABNORMAL LOW (ref 3.5–5.0)
Alkaline Phosphatase: 81 U/L (ref 38–126)
Anion gap: 9 (ref 5–15)
BUN: 28 mg/dL — ABNORMAL HIGH (ref 6–20)
CO2: 17 mmol/L — ABNORMAL LOW (ref 22–32)
Calcium: 8.1 mg/dL — ABNORMAL LOW (ref 8.9–10.3)
Chloride: 101 mmol/L (ref 98–111)
Creatinine, Ser: 1.08 mg/dL (ref 0.61–1.24)
GFR, Estimated: >60 mL/min (ref >60)
Glucose, Bld: 612 mg/dL (ref 70–99)
Potassium: 5 mmol/L (ref 3.5–5.1)
Sodium: 127 mmol/L — ABNORMAL LOW (ref 135–145)
Total Bilirubin: 3.7 mg/dL — ABNORMAL HIGH (ref 0.3–1.2)
Total Protein: 7.3 g/dL (ref 6.5–8.1)

## 2021-02-24 LAB — POC OCCULT BLOOD, ED: Fecal Occult Bld: POSITIVE — AB

## 2021-02-24 LAB — RESP PANEL BY RT-PCR (FLU A&B, COVID) ARPGX2
Influenza A by PCR: NEGATIVE
Influenza B by PCR: NEGATIVE
SARS Coronavirus 2 by RT PCR: NEGATIVE

## 2021-02-24 LAB — TYPE AND SCREEN
ABO/RH(D): A POS
Antibody Screen: NEGATIVE

## 2021-02-24 LAB — BASIC METABOLIC PANEL
Anion gap: 6 (ref 5–15)
BUN: 31 mg/dL — ABNORMAL HIGH (ref 6–20)
CO2: 23 mmol/L (ref 22–32)
Calcium: 8.2 mg/dL — ABNORMAL LOW (ref 8.9–10.3)
Chloride: 103 mmol/L (ref 98–111)
Creatinine, Ser: 0.88 mg/dL (ref 0.61–1.24)
GFR, Estimated: >60 mL/min (ref >60)
Glucose, Bld: 373 mg/dL — ABNORMAL HIGH (ref 70–99)
Potassium: 4.3 mmol/L (ref 3.5–5.1)
Sodium: 132 mmol/L — ABNORMAL LOW (ref 135–145)

## 2021-02-24 LAB — ETHANOL: Alcohol, Ethyl (B): <10 mg/dL (ref <10)

## 2021-02-24 LAB — LIPASE, BLOOD: Lipase: 48 U/L (ref 11–51)

## 2021-02-24 MED ORDER — INSULIN ASPART 100 UNIT/ML IJ SOLN
10.0000 [IU] | Freq: Once | INTRAMUSCULAR | Status: AC
  Administered 2021-02-24: 10 [IU] via SUBCUTANEOUS
  Filled 2021-02-24: qty 1

## 2021-02-24 MED ORDER — PANTOPRAZOLE SODIUM 40 MG IV SOLR
40.0000 mg | Freq: Once | INTRAVENOUS | Status: AC
  Administered 2021-02-24: 40 mg via INTRAVENOUS
  Filled 2021-02-24: qty 40

## 2021-02-24 MED ORDER — METFORMIN HCL 500 MG PO TABS: 500.0000 mg | ORAL_TABLET | Freq: Two times a day (BID) | ORAL | 0 refills | Status: DC

## 2021-02-24 MED ORDER — SODIUM CHLORIDE 0.9 % IV BOLUS
1000.0000 mL | Freq: Once | INTRAVENOUS | Status: AC
  Administered 2021-02-24: 1000 mL via INTRAVENOUS

## 2021-02-24 MED ORDER — INSULIN ASPART 100 UNIT/ML IV SOLN
8.0000 [IU] | Freq: Once | INTRAVENOUS | Status: AC
  Administered 2021-02-24: 8 [IU] via INTRAVENOUS

## 2021-02-24 MED ORDER — INSULIN ASPART 100 UNIT/ML IJ SOLN
6.0000 [IU] | Freq: Once | INTRAMUSCULAR | Status: AC
  Administered 2021-02-24: 6 [IU] via INTRAVENOUS

## 2021-02-24 MED ORDER — PANTOPRAZOLE SODIUM 40 MG PO TBEC: 40.0000 mg | DELAYED_RELEASE_TABLET | Freq: Every day | ORAL | 0 refills | Status: DC

## 2021-02-24 NOTE — Discharge Instructions (Signed)
Your test today showed that you have diabetes.  You have been prescribed medication to help lower your blood sugar.  Avoid sodas and sweets.  It is important that you call your primary doctor to arrange a follow-up appointment for later this week.  Also, your tests show that you have blood in your stool.  I recommend that you call your GI provider, Dr. Leone Payor to arrange follow-up.  Return to the emergency department for any new or worsening symptoms.

## 2021-02-24 NOTE — ED Triage Notes (Signed)
Pt reports generalized weakness x 3 days.  Reports was working today, suddenly felt hot and nauseated.  Reports vomited bright red blood twice and had noticed bright red blood in stool today.  Pt c/o pain in both shoulders x 5 days.  Says hurts to move his shoulders.

## 2021-02-24 NOTE — ED Notes (Signed)
PA at bedside.

## 2021-02-24 NOTE — ED Provider Notes (Signed)
[facility] EMERGENCY DEPARTMENT Provider Note   CSN: [id number] Arrival date & time: 02/24/21  1015     History Chief Complaint  Patient presents with   Weakness    Wesley Williams is a 50 y.o. male.   Weakness Associated symptoms: nausea and vomiting   Associated symptoms: no abdominal pain, no arthralgias, no chest pain, no cough, no dizziness, no dysuria, no fever, no headaches, no myalgias and no shortness of breath        Wesley Williams is a 50 y.o. male with past medical history of cirrhosis, anemia, prior alcohol abuse, hypertension, and esophageal varices without bleeding who presents to the Emergency Department complaining of generalized weakness x3 days.  He is employed with a [employer], he was working this morning when he had a sudden episode of generalized weakness and diaphoresis.  He states he went to sit down and vomited x2 both with bright red blood.  He also had 1 episode of loose stool this morning with bright red blood as well.  He states he has had decreased appetite and not eating a normal amount of food x3 days.  Upon arrival, he reports feeling back to his baseline.  He denies any chest pain, abdominal pain, shortness of breath, fever or chills.  No recent illness.  States that he stopped drinking alcohol July 2021.  Denies any excessive aspirin or NSAID use.  Past Medical History:  Diagnosis Date   Alcohol abuse    Cirrhosis (HCC)    Hypertension    Portal hypertension (HCC)-colopathy    Rhabdomyolysis 04/23/2016   Secondary esophageal varices without bleeding [facility](HCC)     Patient Active Problem List   Diagnosis Date Noted   Secondary esophageal varices without bleeding (HCC)    Rectal varices    Portal hypertension (HCC)-colopathy    Alcoholic cirrhosis of liver without ascites (HCC) 03/18/2020   Pancytopenia (HCC)    Anemia 12/12/2017   Erectile dysfunction 12/12/2017   Alcohol abuse 04/23/2016    Past Surgical History:  Procedure  Laterality Date   COLONOSCOPY WITH PROPOFOL N/A 03/19/2020   Procedure: COLONOSCOPY WITH PROPOFOL;  Surgeon: Iva BoopGessner, Carl E, MD;  [facility];  Service: Endoscopy;  Laterality: N/A;   ESOPHAGEAL BANDING  03/19/2020   Procedure: ESOPHAGEAL BANDING;  Surgeon: Iva BoopGessner, Carl E, MD;  [facility];  Service: Endoscopy;;   ESOPHAGEAL BANDING N/A 04/07/2020   Procedure: ESOPHAGEAL BANDING;  Surgeon: Iva BoopGessner, Carl E, MD;  Location: [facility] ENDOSCOPY;  Service: Endoscopy;  Laterality: N/A;   ESOPHAGEAL BANDING N/A 05/19/2020   Procedure: ESOPHAGEAL BANDING;  Surgeon: Lynann BolognaGupta, Rajesh, MD;  Location: [facility] ENDOSCOPY;  Service: Endoscopy;  Laterality: N/A;   ESOPHAGOGASTRODUODENOSCOPY (EGD) WITH PROPOFOL N/A 03/19/2020   Procedure: ESOPHAGOGASTRODUODENOSCOPY (EGD) WITH PROPOFOL;  Surgeon: Iva BoopGessner, Carl E, MD;  [facility];  Service: Endoscopy;  Laterality: N/A;   ESOPHAGOGASTRODUODENOSCOPY (EGD) WITH PROPOFOL N/A 04/07/2020   Procedure: ESOPHAGOGASTRODUODENOSCOPY (EGD) WITH PROPOFOL;  Surgeon: Iva BoopGessner, Carl E, MD;  Location: [facility] ENDOSCOPY;  Service: Endoscopy;  Laterality: N/A;   ESOPHAGOGASTRODUODENOSCOPY (EGD) WITH PROPOFOL N/A 05/19/2020   Procedure: ESOPHAGOGASTRODUODENOSCOPY (EGD) WITH PROPOFOL;  Surgeon: Lynann BolognaGupta, Rajesh, MD;  Location: [facility] ENDOSCOPY;  Service: Endoscopy;  Laterality: N/A;       Family History  Problem Relation Age of Onset   Diabetes Mother     Social History   Tobacco Use   Smoking status: Never   Smokeless tobacco: Never  Vaping Use   Vaping Use: Never used  Substance Use Topics   Alcohol use: Yes    Comment: former etoh abuse, none since july 2021.   Drug use: No    Home Medications Prior to Admission medications   Medication Sig Start Date End Date Taking? Authorizing Provider  ferrous sulfate 325 (65 FE) MG tablet Take 1 tablet (325 mg total) by mouth daily with breakfast. 04/07/20 04/07/21  Iva Boop, MD  folic acid (FOLVITE) 1 MG tablet Take 1  tablet (1 mg total) by mouth daily. Patient taking differently: Take 800 mcg by mouth daily. 03/21/20   [name], Randa Evens, DO  Multiple Vitamin (MULTIVITAMIN WITH MINERALS) TABS tablet Take 1 tablet by mouth daily. Patient not taking: Reported on 12/24/2020 03/21/20   [name], Randa Evens, DO  nadolol (CORGARD) 20 MG tablet Take 1 tablet (20 mg total) by mouth daily. 12/24/20 03/24/21  Georgina Quint, MD  pantoprazole (PROTONIX) 20 MG tablet Take 1 tablet (20 mg total) by mouth daily. Patient not taking: Reported on 12/24/2020 03/13/20   Peyton Najjar, MD  sildenafil (VIAGRA) 100 MG tablet Take 0.5-1 tablets (50-100 mg total) by mouth daily as needed for erectile dysfunction. 12/24/20   Georgina Quint, MD  thiamine 100 MG tablet Take 1 tablet (100 mg total) by mouth daily. 03/21/20   [name], Randa Evens, DO    Allergies    Patient has no known allergies.  Review of Systems   Review of Systems  Constitutional:  Positive for appetite change. Negative for chills, fatigue and fever.  HENT:  Negative for sore throat and trouble swallowing.   Respiratory:  Negative for cough, shortness of breath and wheezing.   Cardiovascular:  Negative for chest pain and palpitations.  Gastrointestinal:  Positive for blood in stool, nausea and vomiting. Negative for abdominal pain.  Genitourinary:  Negative for decreased urine volume, difficulty urinating, dysuria and hematuria.  Musculoskeletal:  Negative for arthralgias, back pain, myalgias and neck pain.  Skin:  Negative for color change and rash.  Neurological:  Positive for weakness. Negative for dizziness, syncope, numbness and headaches.  Hematological:  Does not bruise/bleed easily.  Psychiatric/Behavioral:  Negative for confusion.    Physical Exam Updated Vital Signs BP 106/66   Pulse 69   Temp 98.1 F (36.7 C) (Oral)   Resp 20   Ht 5\' 5"  (1.651 m)   Wt 93 kg   SpO2 96%   BMI 34.11 kg/m   Physical Exam Vitals and nursing note  reviewed. Exam conducted with a chaperone present.  Constitutional:      General: He is not in acute distress.    Appearance: Normal appearance. He is not ill-appearing or toxic-appearing.  HENT:     Mouth/Throat:     Mouth: Mucous membranes are moist.  Eyes:     Extraocular Movements: Extraocular movements intact.     Conjunctiva/sclera: Conjunctivae normal.     Pupils: Pupils are equal, round, and reactive to light.  Cardiovascular:     Rate and Rhythm: Normal rate and regular rhythm.     Pulses: Normal pulses.  Pulmonary:     Effort: Pulmonary effort is normal.     Breath sounds: Normal breath sounds.  Abdominal:     General: There is no distension.     Tenderness: There is no abdominal tenderness. There is no guarding.  Genitourinary:    Rectum: Guaiac result positive. No mass, tenderness, anal fissure or external hemorrhoid. Normal anal tone.     Comments: Digital rectal exam performed by me with  chaperone present.  Gross blood present.  No palpable rectal masses or tenderness. Musculoskeletal:        General: No tenderness.     Cervical back: Normal range of motion. No tenderness.     Right lower leg: No edema.     Left lower leg: No edema.  Skin:    General: Skin is warm.     Capillary Refill: Capillary refill takes less than 2 seconds.     Findings: No rash.  Neurological:     General: No focal deficit present.     Mental Status: He is alert.     Sensory: Sensation is intact. No sensory deficit.     Motor: Motor function is intact. No weakness.     Comments: CN II-XII grossly intact.  Speech clear.      ED Results / Procedures / Treatments   Labs (all labs ordered are listed, but only abnormal results are displayed) Labs Reviewed  CBC WITH DIFFERENTIAL/PLATELET - Abnormal; Notable for the following components:      Result Value   RBC 3.18 (*)    Hemoglobin 10.9 (*)    HCT 31.1 (*)    MCH 34.3 (*)    Platelets 67 (*)    Lymphs Abs 0.5 (*)    Abs Immature  Granulocytes 0.08 (*)    All other components within normal limits  PROTIME-INR - Abnormal; Notable for the following components:   Prothrombin Time 18.2 (*)    INR 1.5 (*)    All other components within normal limits  COMPREHENSIVE METABOLIC PANEL - Abnormal; Notable for the following components:   Sodium 127 (*)    CO2 17 (*)    Glucose, Bld 612 (*)    BUN 28 (*)    Calcium 8.1 (*)    Albumin 2.7 (*)    AST 50 (*)    Total Bilirubin 3.7 (*)    All other components within normal limits  URINALYSIS, ROUTINE W REFLEX MICROSCOPIC - Abnormal; Notable for the following components:   Color, Urine AMBER (*)    Glucose, UA >=500 (*)    Hgb urine dipstick MODERATE (*)    Ketones, ur 20 (*)    All other components within normal limits  BASIC METABOLIC PANEL - Abnormal; Notable for the following components:   Sodium 132 (*)    Glucose, Bld 373 (*)    BUN 31 (*)    Calcium 8.2 (*)    All other components within normal limits  POC OCCULT BLOOD, ED - Abnormal; Notable for the following components:   Fecal Occult Bld POSITIVE (*)    All other components within normal limits  CBG MONITORING, ED - Abnormal; Notable for the following components:   Glucose-Capillary 433 (*)    All other components within normal limits  CBG MONITORING, ED - Abnormal; Notable for the following components:   Glucose-Capillary 391 (*)    All other components within normal limits  RESP PANEL BY RT-PCR (FLU A&B, COVID) ARPGX2  ETHANOL  LIPASE, BLOOD  TYPE AND SCREEN    EKG EKG Interpretation  Date/Time:  Tuesday February 24 2021 10:41:38 EDT Ventricular Rate:  68 PR Interval:  143 QRS Duration: 94 QT Interval:  454 QTC Calculation: 483 R Axis:   60 Text Interpretation: Sinus rhythm Borderline prolonged QT interval No significant change since last tracing Confirmed by Jacalyn Lefevre [phone]) on 02/24/2021 11:05:19 AM  Radiology DG Chest Portable 1 View  Result Date: 02/24/2021 CLINICAL DATA:  Weakness.  EXAM: PORTABLE CHEST 1 VIEW COMPARISON:  Chest x-ray 03/24/2020. FINDINGS: Mediastinum and hilar structures normal. Heart size normal. Low lung volumes. No focal infiltrate. No pleural effusion or pneumothorax. Degenerative change thoracic spine. IMPRESSION: Low lung volumes. No acute cardiopulmonary disease. Chest is stable from prior exam. Electronically Signed   By: Maisie Fus  Register   On: 02/24/2021 11:29     Procedures Procedures   Medications Ordered in ED Medications  pantoprazole (PROTONIX) injection 40 mg (40 mg Intravenous Given 02/24/21 1111)    ED Course  I have reviewed the triage vital signs and the nursing notes.  Pertinent labs & imaging results that were available during my care of the patient were reviewed by me and considered in my medical decision making (see chart for details).    MDM Rules/Calculators/A&P                          Pt here for evaluation of sudden onset of generalized weakness, diaphoresis nausea and vomiting.  Notes having 2 episodes of vomiting with bright red blood and 1 episode of loose stools with bright red blood.  Has history of esophageal varices and alcohol abuse but denies any alcohol intake since July 2021.  Digital rectal exam reveals gross blood.  No palpable rectal masses or external hemorrhoids.  Labs interpreted by me, show hemoglobin improved from 1 year ago.  No leukocytosis.  Thrombocytopenia with history of same.  Urinalysis without evidence of infection.  COVID test negative.  Blood glucose greater than 600.  No reported history of diabetes.  BUN mildly elevated creatinine unremarkable.  Anion gap is within normal limits.  And GFR is greater than 60.  Patient will be started on IV fluids and given insulin.  On recheck, patient resting comfortably.  Vitals reassuring.  He has been given 2 L of IV fluids and repeated doses of insulin.  Currently blood sugar is 373.  He has tolerated oral fluids without difficulty and states he is feeling  better and ready for discharge home.  His abdomen remains soft and nontender.  No clinical evidence for DKA or HHS.  His hemoglobin is improved from 1 year ago.  No further reports of weakness or dizziness.  He is ambulatory with steady gait.  He is followed by GI in [place] and had previous EGD and colonoscopy 1 year ago.  I feel that patient is appropriate for discharge home.  Will start on metformin and he agrees to close follow-up with his PCP this week and will also contact his GI provider.  Strict return precautions were discussed.   Final Clinical Impression(s) / ED Diagnoses Final diagnoses:  New onset type 2 diabetes mellitus (HCC)  Bright red rectal bleeding    Rx / DC Orders ED Discharge Orders     None        Rosey Bath 02/26/21 2157    Jacalyn Lefevre, MD 02/28/21 [phone]

## 2021-03-26 ENCOUNTER — Telehealth: Payer: Self-pay | Admitting: Emergency Medicine

## 2021-03-26 NOTE — Telephone Encounter (Signed)
Pt needs refill on metFORMIN (GLUCOPHAGE) 500 MG tablet and pantoprazole (PROTONIX) 40 MG tablet sent to [facility]

## 2021-03-30 ENCOUNTER — Other Ambulatory Visit: Payer: Self-pay

## 2021-03-30 MED ORDER — METFORMIN HCL 500 MG PO TABS: 500.0000 mg | ORAL_TABLET | Freq: Two times a day (BID) | ORAL | 0 refills | Status: DC

## 2021-03-30 MED ORDER — PANTOPRAZOLE SODIUM 40 MG PO TBEC: 40.0000 mg | DELAYED_RELEASE_TABLET | Freq: Every day | ORAL | 0 refills | Status: DC

## 2021-03-30 NOTE — Telephone Encounter (Signed)
Medication refilled

## 2021-05-04 ENCOUNTER — Telehealth: Payer: Self-pay | Admitting: Emergency Medicine

## 2021-05-04 NOTE — Telephone Encounter (Signed)
1.Medication Requested: metFORMIN HCl  2. Pharmacy (Name, Street, [place]): [facility] - [place] ([place]), [place] - [address]  [address] [place] ([place]) [place] [address]  Phone:  [phone]  Fax:  [phone]  3. On Med List: Y  4. Last Visit with PCP: 12/24/2020  5. Next visit date with PCP: 06/29/2021   Agent: Please be advised that RX refills may take up to 3 business days. We ask that you follow-up with your pharmacy.

## 2021-05-06 MED ORDER — METFORMIN HCL 500 MG PO TABS: 500.0000 mg | ORAL_TABLET | Freq: Two times a day (BID) | ORAL | 0 refills | Status: DC

## 2021-05-06 NOTE — Telephone Encounter (Signed)
Refilled Metformin.

## 2021-06-15 ENCOUNTER — Emergency Department (HOSPITAL_COMMUNITY)
Admission: EM | Admit: 2021-06-15 | Discharge: 2021-06-15 | Disposition: A | Discharge status: Home / Self Care | Payer: Self-pay | Attending: Emergency Medicine | Admitting: Emergency Medicine

## 2021-06-15 ENCOUNTER — Emergency Department (HOSPITAL_COMMUNITY): Payer: Self-pay

## 2021-06-15 ENCOUNTER — Encounter (HOSPITAL_COMMUNITY): Payer: Self-pay | Admitting: *Deleted

## 2021-06-15 ENCOUNTER — Other Ambulatory Visit: Payer: Self-pay

## 2021-06-15 DIAGNOSIS — M1712 Unilateral primary osteoarthritis, left knee: Secondary | ICD-10-CM | POA: Insufficient documentation

## 2021-06-15 DIAGNOSIS — M25562 Pain in left knee: Secondary | ICD-10-CM

## 2021-06-15 DIAGNOSIS — Z7984 Long term (current) use of oral hypoglycemic drugs: Secondary | ICD-10-CM | POA: Insufficient documentation

## 2021-06-15 DIAGNOSIS — I10 Essential (primary) hypertension: Secondary | ICD-10-CM | POA: Insufficient documentation

## 2021-06-15 MED ORDER — DICLOFENAC SODIUM 1 % EX GEL: 2.0000 g | Freq: Three times a day (TID) | CUTANEOUS | 0 refills | Status: DC | PRN

## 2021-06-15 NOTE — ED Triage Notes (Signed)
Pt c/o left knee pain and swelling since Saturday night. Denies injury.

## 2021-06-15 NOTE — Discharge Instructions (Addendum)
You were seen in the emergency department for left knee pain.  Your x-ray showed significant arthritis.  We are prescribing you some gel to use on the knee.  Brace as needed for comfort.  Please contact your primary care doctor and the orthopedic doctor for follow-up.

## 2021-06-15 NOTE — ED Provider Notes (Signed)
[facility] EMERGENCY DEPARTMENT Provider Note   CSN: [id number] Arrival date & time: 06/15/21  7829     History Chief Complaint  Patient presents with   Knee Pain    Wesley Williams is a 50 y.o. male.  He is here with a complaint of left knee pain that is been going on for 3 days.  He denies any trauma.  He says this is happened to him before.  Pain is worse with walking.  Has tried nothing for it.  No numbness or weakness.  No fevers or chills.  The history is provided by the patient.  Knee Pain Location:  Knee Time since incident:  3 days Injury: no   Knee location:  L knee Pain details:    Quality:  Sharp   Radiates to:  Does not radiate   Severity:  Severe   Onset quality:  Gradual   Duration:  3 days   Timing:  Intermittent   Progression:  Unchanged Chronicity:  Recurrent Dislocation: no   Relieved by:  None tried Worsened by:  Activity Ineffective treatments:  None tried Associated symptoms: no fever and no numbness       Past Medical History:  Diagnosis Date   Alcohol abuse    Cirrhosis (HCC)    Hypertension    Portal hypertension (HCC)-colopathy    Rhabdomyolysis 04/23/2016   Secondary esophageal varices without bleeding [facility])     Patient Active Problem List   Diagnosis Date Noted   Secondary esophageal varices without bleeding (HCC)    Rectal varices    Portal hypertension (HCC)-colopathy    Alcoholic cirrhosis of liver without ascites (HCC) 03/18/2020   Pancytopenia (HCC)    Anemia 12/12/2017   Erectile dysfunction 12/12/2017   Alcohol abuse 04/23/2016    Past Surgical History:  Procedure Laterality Date   COLONOSCOPY WITH PROPOFOL N/A 03/19/2020   Procedure: COLONOSCOPY WITH PROPOFOL;  Surgeon: Iva Boop, MD;  Location: [facility] ENDOSCOPY;  Service: Endoscopy;  Laterality: N/A;   ESOPHAGEAL BANDING  03/19/2020   Procedure: ESOPHAGEAL BANDING;  Surgeon: Iva Boop, MD;  Location: [facility] ENDOSCOPY;  Service: Endoscopy;;   ESOPHAGEAL BANDING N/A  04/07/2020   Procedure: ESOPHAGEAL BANDING;  Surgeon: Iva Boop, MD;  Location: [facility] ENDOSCOPY;  Service: Endoscopy;  Laterality: N/A;   ESOPHAGEAL BANDING N/A 05/19/2020   Procedure: ESOPHAGEAL BANDING;  Surgeon: Lynann Bologna, MD;  Location: [facility] ENDOSCOPY;  Service: Endoscopy;  Laterality: N/A;   ESOPHAGOGASTRODUODENOSCOPY (EGD) WITH PROPOFOL N/A 03/19/2020   Procedure: ESOPHAGOGASTRODUODENOSCOPY (EGD) WITH PROPOFOL;  Surgeon: Iva Boop, MD;  Location: [facility] ENDOSCOPY;  Service: Endoscopy;  Laterality: N/A;   ESOPHAGOGASTRODUODENOSCOPY (EGD) WITH PROPOFOL N/A 04/07/2020   Procedure: ESOPHAGOGASTRODUODENOSCOPY (EGD) WITH PROPOFOL;  Surgeon: Iva Boop, MD;  Location: [facility] ENDOSCOPY;  Service: Endoscopy;  Laterality: N/A;   ESOPHAGOGASTRODUODENOSCOPY (EGD) WITH PROPOFOL N/A 05/19/2020   Procedure: ESOPHAGOGASTRODUODENOSCOPY (EGD) WITH PROPOFOL;  Surgeon: Lynann Bologna, MD;  Location: [facility] ENDOSCOPY;  Service: Endoscopy;  Laterality: N/A;       Family History  Problem Relation Age of Onset   Diabetes Mother     Social History   Tobacco Use   Smoking status: Never   Smokeless tobacco: Never  Vaping Use   Vaping Use: Never used  Substance Use Topics   Alcohol use: Not Currently    Comment: former etoh abuse, none since july 2021.   Drug use: No    Home Medications Prior to Admission medications   Medication Sig Start  Date End Date Taking? Authorizing Provider  ferrous sulfate 325 (65 FE) MG tablet Take 1 tablet (325 mg total) by mouth daily with breakfast. 04/07/20 04/07/21  Iva Boop, MD  folic acid (FOLVITE) 1 MG tablet Take 1 tablet (1 mg total) by mouth daily. Patient taking differently: Take 800 mcg by mouth daily. 03/21/20   [name], Randa Evens, DO  metFORMIN (GLUCOPHAGE) 500 MG tablet Take 1 tablet (500 mg total) by mouth 2 (two) times daily with a meal. 05/06/21   Sagardia, Eilleen Kempf, MD  Multiple Vitamin (MULTIVITAMIN WITH MINERALS) TABS tablet Take 1 tablet by mouth  daily. Patient not taking: Reported on 12/24/2020 03/21/20   [name], Randa Evens, DO  nadolol (CORGARD) 20 MG tablet Take 1 tablet (20 mg total) by mouth daily. 12/24/20 03/24/21  Georgina Quint, MD  pantoprazole (PROTONIX) 40 MG tablet Take 1 tablet (40 mg total) by mouth daily. 03/30/21   Georgina Quint, MD  sildenafil (VIAGRA) 100 MG tablet Take 0.5-1 tablets (50-100 mg total) by mouth daily as needed for erectile dysfunction. 12/24/20   Georgina Quint, MD  thiamine 100 MG tablet Take 1 tablet (100 mg total) by mouth daily. 03/21/20   [name], Randa Evens, DO    Allergies    Patient has no known allergies.  Review of Systems   Review of Systems  Constitutional:  Negative for fever.  Musculoskeletal:  Positive for gait problem.  Skin:  Negative for wound.  Neurological:  Negative for weakness and numbness.   Physical Exam Updated Vital Signs BP (!) 148/106 (BP Location: Left Arm)   Pulse 65   Temp 98.4 F (36.9 C) (Oral)   Resp 16   Ht 5\' 5"  (1.651 m)   Wt 89.8 kg   SpO2 100%   BMI 32.95 kg/m   Physical Exam Vitals and nursing note reviewed.  Constitutional:      Appearance: Normal appearance. He is well-developed.  HENT:     Head: Normocephalic and atraumatic.  Eyes:     Conjunctiva/sclera: Conjunctivae normal.  Pulmonary:     Effort: Pulmonary effort is normal.  Musculoskeletal:        General: Tenderness present. No signs of injury.     Cervical back: Neck supple.     Comments: Range of motion of left hip and left ankle.  Left knee has decreased range of motion secondary to pain.  Diffuse tenderness over lateral and medial joint line.  Extensor mechanism intact.  No overlying erythema or warmth.  Skin:    General: Skin is warm and dry.  Neurological:     Mental Status: He is alert.     GCS: GCS eye subscore is 4. GCS verbal subscore is 5. GCS motor subscore is 6.    ED Results / Procedures / Treatments   Labs (all labs ordered are listed, but only  abnormal results are displayed) Labs Reviewed - No data to display  EKG None  Radiology DG Knee Complete 4 Views Left  Result Date: 06/15/2021 CLINICAL DATA:  Knee pain with swelling since Saturday EXAM: LEFT KNEE - COMPLETE 4+ VIEW COMPARISON:  Knee radiographs 04/29/2016 FINDINGS: There is no acute fracture or dislocation. Knee alignment is normal. There is severe medial and mild lateral tibiofemoral joint space narrowing with associated subchondral sclerosis and osteophytosis, consistent with osteoarthritis, progressed since 2017. There is chondrocalcinosis in the lateral compartment. There is no definite effusion. IMPRESSION: 1. No acute fracture or dislocation. 2. Severe joint space narrowing involving the medial compartment  with associated subchondral sclerosis and osteophytosis, progressed since 2017. 3. Chondrocalcinosis. Electronically Signed   By: Lesia Hausen M.D.   On: 06/15/2021 08:20    Procedures Procedures   Medications Ordered in ED Medications - No data to display  ED Course  I have reviewed the triage vital signs and the nursing notes.  Pertinent labs & imaging results that were available during my care of the patient were reviewed by me and considered in my medical decision making (see chart for details).  Clinical Course as of 06/15/21 2135  Mon Jun 15, 2021  2505 Left knee x-ray does not show any acute fracture or dislocation.  Does have significant osteoarthritis.  Awaiting radiology reading. [MB]    Clinical Course User Index [MB] Terrilee Files, MD   MDM Rules/Calculators/A&P                          50 year old male here with atraumatic left knee pain.  History of same.  Diffuse tenderness on exam with no obvious signs of infection.  X-ray ordered and interpreted as degenerative changes.  Reviewed with patient.  He does have a history of significant alcohol use and prior GI bleed so we will hold on oral NSAIDs.  Prescribed topical NSAIDs and knee sleeve.   Recommended close follow-up with PCP and orthopedics.  Return instructions discussed  Final Clinical Impression(s) / ED Diagnoses Final diagnoses:  Acute pain of left knee  Osteoarthritis of left knee, unspecified osteoarthritis type    Rx / DC Orders ED Discharge Orders     None        Terrilee Files, MD 06/15/21 2137

## 2021-06-19 ENCOUNTER — Telehealth: Payer: Self-pay | Admitting: Emergency Medicine

## 2021-06-19 NOTE — Telephone Encounter (Signed)
1.Medication Requested: metFORMIN (GLUCOPHAGE) 500 MG tablet 2. Pharmacy (Name, Street, [place]): [facility] - [place] ([place]), [place] - [address] Phone:  [phone]  Fax:  [phone]     3. On Med List: Y  4. Last Visit with PCP: 12/24/2020  5. Next visit date with PCP: 06/29/2021   Agent: Please be advised that RX refills may take up to 3 business days. We ask that you follow-up with your pharmacy.

## 2021-06-22 MED ORDER — METFORMIN HCL 500 MG PO TABS
500.0000 mg | ORAL_TABLET | Freq: Two times a day (BID) | ORAL | 0 refills | Status: DC
Start: 2021-06-22 — End: 2021-06-29

## 2021-06-22 NOTE — Telephone Encounter (Signed)
Refilled medication

## 2021-06-29 ENCOUNTER — Other Ambulatory Visit: Payer: Self-pay

## 2021-06-29 ENCOUNTER — Encounter: Payer: Self-pay | Admitting: Emergency Medicine

## 2021-06-29 ENCOUNTER — Ambulatory Visit (INDEPENDENT_AMBULATORY_CARE_PROVIDER_SITE_OTHER): Payer: Self-pay | Admitting: Emergency Medicine

## 2021-06-29 VITALS — BP 132/88 | HR 63 | Temp 97.9°F | Ht 65.0 in | Wt 188.0 lb

## 2021-06-29 DIAGNOSIS — I8510 Secondary esophageal varices without bleeding: Secondary | ICD-10-CM

## 2021-06-29 DIAGNOSIS — K7030 Alcoholic cirrhosis of liver without ascites: Secondary | ICD-10-CM

## 2021-06-29 DIAGNOSIS — N481 Balanitis: Secondary | ICD-10-CM

## 2021-06-29 DIAGNOSIS — R739 Hyperglycemia, unspecified: Secondary | ICD-10-CM | POA: Insufficient documentation

## 2021-06-29 LAB — COMPREHENSIVE METABOLIC PANEL
ALT: 26 U/L (ref 0–53)
AST: 39 U/L — ABNORMAL HIGH (ref 0–37)
Albumin: 3.5 g/dL (ref 3.5–5.2)
Alkaline Phosphatase: 104 U/L (ref 39–117)
BUN: 13 mg/dL (ref 6–23)
CO2: 24 mEq/L (ref 19–32)
Calcium: 9.6 mg/dL (ref 8.4–10.5)
Chloride: 106 mEq/L (ref 96–112)
Creatinine, Ser: 0.54 mg/dL (ref 0.40–1.50)
GFR: 115.37 mL/min (ref >60.00)
Glucose, Bld: 109 mg/dL — ABNORMAL HIGH (ref 70–99)
Potassium: 4.3 mEq/L (ref 3.5–5.1)
Sodium: 138 mEq/L (ref 135–145)
Total Bilirubin: 2 mg/dL — ABNORMAL HIGH (ref 0.2–1.2)
Total Protein: 8.4 g/dL — ABNORMAL HIGH (ref 6.0–8.3)

## 2021-06-29 LAB — CBC WITH DIFFERENTIAL/PLATELET
Basophils Absolute: 0 10*3/uL (ref 0.0–0.1)
Basophils Relative: 1 % (ref 0.0–3.0)
Eosinophils Absolute: 0.2 10*3/uL (ref 0.0–0.7)
Eosinophils Relative: 4.9 % (ref 0.0–5.0)
HCT: 37.9 % — ABNORMAL LOW (ref 39.0–52.0)
Hemoglobin: 13.2 g/dL (ref 13.0–17.0)
Lymphocytes Relative: 19.3 % (ref 12.0–46.0)
Lymphs Abs: 0.7 10*3/uL (ref 0.7–4.0)
MCHC: 34.8 g/dL (ref 30.0–36.0)
MCV: 97.3 fl (ref 78.0–100.0)
Monocytes Absolute: 0.5 10*3/uL (ref 0.1–1.0)
Monocytes Relative: 13.5 % — ABNORMAL HIGH (ref 3.0–12.0)
Neutro Abs: 2.3 10*3/uL (ref 1.4–7.7)
Neutrophils Relative %: 61.3 % (ref 43.0–77.0)
Platelets: 67 10*3/uL — ABNORMAL LOW (ref 150.0–400.0)
RBC: 3.89 Mil/uL — ABNORMAL LOW (ref 4.22–5.81)
RDW: 14.9 % (ref 11.5–15.5)
WBC: 3.7 10*3/uL — ABNORMAL LOW (ref 4.0–10.5)

## 2021-06-29 LAB — POCT GLYCOSYLATED HEMOGLOBIN (HGB A1C): Hemoglobin A1C: 4.8 % (ref 4.0–5.6)

## 2021-06-29 LAB — LIPID PANEL
Cholesterol: 165 mg/dL (ref 0–200)
HDL: 45.4 mg/dL (ref >39.00)
LDL Cholesterol: 99 mg/dL (ref 0–99)
NonHDL: 119.31
Total CHOL/HDL Ratio: 4
Triglycerides: 102 mg/dL (ref 0.0–149.0)
VLDL: 20.4 mg/dL (ref 0.0–40.0)

## 2021-06-29 MED ORDER — NADOLOL 20 MG PO TABS: 20.0000 mg | ORAL_TABLET | Freq: Every day | ORAL | 3 refills | Status: DC

## 2021-06-29 MED ORDER — CLOTRIMAZOLE-BETAMETHASONE 1-0.05 % EX CREA: 1.0000 "application " | TOPICAL_CREAM | Freq: Two times a day (BID) | CUTANEOUS | 1 refills | Status: DC

## 2021-06-29 NOTE — Assessment & Plan Note (Addendum)
Globin A1c at 4.8.  Losing weight.  No history of diabetes. Stop metformin. Diet and nutrition discussed. Follow-up in 6 months.

## 2021-06-29 NOTE — Patient Instructions (Signed)
Stop metformin. Continue nadolol 20 mg daily. Cirrosis Cirrhosis La cirrosis es una lesin de largo plazo (crnica) en el hgado. El hgado es el rgano interno ms grande del cuerpo, y cumple muchas funciones. Este rgano transforma los [employer], elimina las sustancias txicas de la [place], [place] protenas importantes y absorbe las vitaminas necesarias de los alimentos. En la cirrosis, las clulas hepticas son reemplazadas por tejido cicatricial. Esto impide que la sangre circule por el hgado y dificulta que el hgado realice sus funciones. Cules son las causas? Las causas ms comunes de la cirrosis son la hepatitis C y el consumo prolongado de alcohol. Otras causas son las siguientes: Enfermedad por hgado graso no alcohlico [facility]). Esto sucede cuando la grasa se deposita en el hgado por otras causas que no sean el alcohol. Infeccin por hepatitis B. Hepatitis autoinmune. En esta enfermedad, el sistema de defensa del cuerpo (sistema inmunitario) ataca por error las clulas del hgado, lo que causa inflamacin. Enfermedades que [employer] conductos dentro del hgado. Enfermedades hepticas hereditarias, como la hemocromatosis. Esta es una de las enfermedades hepticas hereditarias ms comunes. En esta enfermedad, depsitos de hierro se acumulan en el hgado y otros rganos. Reacciones a determinados medicamentos a Air cabin crew, como la [place], un medicamento para Insurance underwriter. Infecciones por parsitos. Estas incluyen la esquistosomiasis, que es una enfermedad causada por un platelminto. Contacto prolongado con ciertas sustancias txicas. Estas sustancias txicas incluyen ciertos solventes orgnicos, tales como cloroformo y tolueno. Qu incrementa el riesgo? Es ms probable que sufra esta afeccin si: Tiene ciertos tipos de hepatitis viral. Consume alcohol en exceso, especialmente si es mujer. Tiene sobrepeso. [place] drogas intravenosas y comparte agujas. Tiene  relaciones sexuales sin usar proteccin con alguien que tiene una hepatitis viral. Cules son los signos o sntomas? Es posible que no tenga signos ni sntomas al principio. Puede que los sntomas no se manifiesten hasta que el dao heptico empiece a Theme park manager. Entre los primeros sntomas, se pueden incluir los siguientes: Debilidad y Merchant navy officer (fatiga). Cambios en los patrones de sueo o dificultad para dormir. Picazn. Dolor a Merchandiser, retail parte superior derecha del abdomen. Adelgazamiento y prdida de masa muscular. Nuseas. Prdida del apetito. Entre los sntomas que aparecen en una etapa posterior se pueden incluir los siguientes: Fatiga o debilidad que [place]. Piel y ojos amarillos (ictericia). Acumulacin de lquido en el abdomen (ascitis). Puede notar que la ropa se le aprieta alrededor de la cintura. Aumento de peso e hinchazn de los pies y los tobillos (edema). Dificultad para respirar. Tener hematomas o hemorragias. Vomitar sangre o tener heces negras o con sangre. Confusin mental. Cmo se diagnostica? El mdico puede sospechar la presencia de cirrosis en funcin de los sntomas y la historia clnica, en especial si tiene otras enfermedades o antecedentes de consumo de alcohol. El [employer] har un examen fsico para palparle el hgado y buscar signos de cirrosis. Las pruebas pueden incluir las siguientes: Anlisis de sangre para Chief Operating Officer lo siguiente: Si tiene hepatitis B o C. La funcin renal. La funcin heptica. Pruebas de diagnstico por imgenes, por ejemplo: Resonancia magntica (RM) o exploracin por tomografa computarizada (TC) para buscar los cambios que se observan en los casos de cirrosis [place]. Ecografa para determinar si el tejido heptico normal est siendo reemplazado por tejido cicatricial. Un procedimiento en el que se [place] una aguja larga para tomar Lauris Poag de tejido heptico para estudiarlo en un laboratorio (biopsia). La biopsia de  hgado puede confirmar el diagnstico de cirrosis.  Cmo se trata? El tratamiento de esta afeccin depende de la magnitud del dao heptico y qu lo caus. Puede incluir el tratamiento de los sntomas de la cirrosis o de las causas subyacentes, a fin de Engineer, manufacturing el avance del [place]. El tratamiento puede incluir: Cambios de [place] de vida, como: Seguir una dieta saludable. Puede tener que consultar al mdico o a un nutricionista para Runner, broadcasting/film/video de alimentacin. Restringir la ingesta de sal. Mantener un peso saludable. No consumir drogas o alcohol. Tomar medicamentos para: Warehouse manager las infecciones del hgado u otras infecciones. Controlar la picazn. Disminuir la acumulacin de lquido. Reducir ciertas sustancias txicas de la sangre. Reducir el riesgo de hemorragia de los vasos sanguneos agrandados en el estmago o el esfago (vrices). Trasplante de hgado. En este procedimiento, el hgado de un donante se [place] para reemplazar el hgado enfermo. Esto se realiza si la cirrosis ha ocasionado insuficiencia heptica. Otros tratamientos y procedimientos, segn los problemas que usted tenga debido a la cirrosis. Los problemas frecuentes incluyen insuficiencia renal relacionada con el hgado (sndrome hepatorenal). Siga estas instrucciones en su casa:  Tome los medicamentos solamente como se lo haya indicado el mdico. No use medicamentos que sean txicos para el hgado. Consulte al mdico antes de tomar algn [employer], incluidos los de [address], [place] los antiinflamatorios no esteroideos [place]). Descanse todo lo que sea necesario. Siga una alimentacin bien equilibrada. Limite el consumo de sal o de agua, si el mdico le indica que lo haga. No beba alcohol. Esto es especialmente importante si toma paracetamol de manera habitual. Cumpla con todas las visitas de seguimiento. Esto es importante. Comunquese con un mdico si: Tiene fatiga o debilidad que empeora. Se le hinchan las  manos, los pies o las piernas, o tiene acumulacin de lquido en el abdomen (ascitis). Tiene fiebre o escalofros. Pierde el apetito. Siente nuseas o vmitos. Tiene ictericia. Se le forman hematomas o sangra con facilidad. Solicite ayuda de inmediato si: Vomita sangre de color rojo brillante o una sustancia parecida a los granos de caf. Tiene [employer]. Nota que las heces son de color negro y de aspecto alquitranado. Comienza a sentirse confundido. Tiene dolor en el pecho o dificultad para respirar. Estos sntomas pueden representar un problema grave que constituye Radio broadcast assistant. No espere a ver si los sntomas desaparecen. Solicite atencin mdica de inmediato. Comunquese con el servicio de emergencias de su localidad (911 en los [place]). No conduzca por sus propios medios [employer]. Resumen La cirrosis es una lesin crnica en el hgado. Las causas ms comunes son la hepatitis C y el consumo prolongado de alcohol. Las pruebas para diagnosticar la cirrosis incluyen anlisis de [place], estudios de diagnstico por imgenes y biopsia del hgado. El tratamiento de la afeccin incluye el tratamiento de la afeccin preexistente. Evite el consumo de alcohol, drogas, sal y los medicamentos que pueden daar el hgado. Busque ayuda de inmediato si vomita sangre de color rojo brillante o una sustancia parecida a los granos de caf. Esta informacin no tiene Theme park manager el consejo del mdico. Asegrese de hacerle al mdico cualquier pregunta que tenga. Document Revised: 06/23/2020 Document Reviewed: 06/23/2020 Elsevier Patient Education  2022 [employer].

## 2021-06-29 NOTE — Assessment & Plan Note (Addendum)
Clinically euvolemic.  Stable vital signs.  No signs of hepatic encephalopathy.  No significant jaundice.  No abdominal pain.  Afebrile. Clinically stable.  No longer drinking alcohol.

## 2021-06-29 NOTE — Assessment & Plan Note (Signed)
No clinical bleeding.  No melena. Continue nadolol 20 mg daily.

## 2021-06-29 NOTE — Progress Notes (Signed)
Wesley Williams 50 y.o.   Chief Complaint  Patient presents with   Follow-up    6 mon. F/U on chronic conditions. Recently in ED for knee pain    HISTORY OF PRESENT ILLNESS: This is a 50 y.o. male with history of alcoholic liver cirrhosis here for follow-up. Was recently seen in the emergency department for knee pain and found to have hyperglycemia.  Presently taking metformin 500 mg twice a day. History of hypertension on nadolol 20 mg daily.  Needs prescription refill.  HPI   Prior to Admission medications   Medication Sig Start Date End Date Taking? Authorizing Provider  diclofenac Sodium (VOLTAREN) 1 % GEL Apply 2 g topically 3 (three) times daily as needed. 06/15/21  Yes Terrilee Files, MD  ferrous sulfate 325 (65 FE) MG tablet Take 1 tablet (325 mg total) by mouth daily with breakfast. 04/07/20 06/29/21 Yes Iva Boop, MD  metFORMIN (GLUCOPHAGE) 500 MG tablet Take 1 tablet (500 mg total) by mouth 2 (two) times daily with a meal. 06/22/21  Yes Beila Purdie, Eilleen Kempf, MD  Multiple Vitamin (MULTIVITAMIN WITH MINERALS) TABS tablet Take 1 tablet by mouth daily. 03/21/20  Yes [name], Randa Evens, DO  pantoprazole (PROTONIX) 40 MG tablet Take 1 tablet (40 mg total) by mouth daily. 03/30/21  Yes [place] Steidle, Eilleen Kempf, MD  sildenafil (VIAGRA) 100 MG tablet Take 0.5-1 tablets (50-100 mg total) by mouth daily as needed for erectile dysfunction. 12/24/20  Yes Chasey Dull, Eilleen Kempf, MD  thiamine 100 MG tablet Take 1 tablet (100 mg total) by mouth daily. 03/21/20  Yes [name], Randa Evens, DO  folic acid (FOLVITE) 1 MG tablet Take 1 tablet (1 mg total) by mouth daily. Patient not taking: Reported on 06/29/2021 03/21/20   [name], Randa Evens, DO  nadolol (CORGARD) 20 MG tablet Take 1 tablet (20 mg total) by mouth daily. 12/24/20 03/24/21  Georgina Quint, MD    No Known Allergies  Patient Active Problem List   Diagnosis Date Noted   Secondary esophageal varices without bleeding (HCC)     Rectal varices    Portal hypertension (HCC)-colopathy    Alcoholic cirrhosis of liver without ascites (HCC) 03/18/2020   Pancytopenia (HCC)    Anemia 12/12/2017   Erectile dysfunction 12/12/2017   Alcohol abuse 04/23/2016    Past Medical History:  Diagnosis Date   Alcohol abuse    Cirrhosis (HCC)    Hypertension    Portal hypertension (HCC)-colopathy    Rhabdomyolysis 04/23/2016   Secondary esophageal varices without bleeding [facility])     Past Surgical History:  Procedure Laterality Date   COLONOSCOPY WITH PROPOFOL N/A 03/19/2020   Procedure: COLONOSCOPY WITH PROPOFOL;  Surgeon: Iva Boop, MD;  Location: [facility] ENDOSCOPY;  Service: Endoscopy;  Laterality: N/A;   ESOPHAGEAL BANDING  03/19/2020   Procedure: ESOPHAGEAL BANDING;  Surgeon: Iva Boop, MD;  Location: [facility] ENDOSCOPY;  Service: Endoscopy;;   ESOPHAGEAL BANDING N/A 04/07/2020   Procedure: ESOPHAGEAL BANDING;  Surgeon: Iva Boop, MD;  Location: [facility] ENDOSCOPY;  Service: Endoscopy;  Laterality: N/A;   ESOPHAGEAL BANDING N/A 05/19/2020   Procedure: ESOPHAGEAL BANDING;  Surgeon: Lynann Bologna, MD;  Location: [facility] ENDOSCOPY;  Service: Endoscopy;  Laterality: N/A;   ESOPHAGOGASTRODUODENOSCOPY (EGD) WITH PROPOFOL N/A 03/19/2020   Procedure: ESOPHAGOGASTRODUODENOSCOPY (EGD) WITH PROPOFOL;  Surgeon: Iva Boop, MD;  Location: [facility] ENDOSCOPY;  Service: Endoscopy;  Laterality: N/A;   ESOPHAGOGASTRODUODENOSCOPY (EGD) WITH PROPOFOL N/A 04/07/2020   Procedure: ESOPHAGOGASTRODUODENOSCOPY (EGD) WITH PROPOFOL;  Surgeon: Iva Boop,  MD;  Location: [facility] ENDOSCOPY;  Service: Endoscopy;  Laterality: N/A;   ESOPHAGOGASTRODUODENOSCOPY (EGD) WITH PROPOFOL N/A 05/19/2020   Procedure: ESOPHAGOGASTRODUODENOSCOPY (EGD) WITH PROPOFOL;  Surgeon: Lynann Bologna, MD;  Location: [facility] ENDOSCOPY;  Service: Endoscopy;  Laterality: N/A;    Social History   Socioeconomic History   Marital status: Single    Spouse name: Not on file   Number of children: Not  on file   Years of education: Not on file   Highest education level: Not on file  Occupational History   Not on file  Tobacco Use   Smoking status: Never   Smokeless tobacco: Never  Vaping Use   Vaping Use: Never used  Substance and Sexual Activity   Alcohol use: Not Currently    Comment: former etoh abuse, none since july 2021.   Drug use: No   Sexual activity: Yes  Other Topics Concern   Not on file  Social History Narrative   Single   Works for a tree service   Alcohol abuse hx   Never smoker, no drugs   Social Determinants of Corporate investment banker Strain: Not on file  Food Insecurity: Not on file  Transportation Needs: Not on file  Physical Activity: Not on file  Stress: Not on file  Social Connections: Not on file  Intimate Partner Violence: Not on file    Family History  Problem Relation Age of Onset   Diabetes Mother      Review of Systems  Constitutional: Negative.  Negative for fever.  HENT: Negative.  Negative for congestion and sore throat.   Respiratory: Negative.  Negative for cough and shortness of breath.   Cardiovascular: Negative.  Negative for chest pain and palpitations.  Gastrointestinal:  Negative for abdominal pain, nausea and vomiting.  Genitourinary: Negative.        Small red spots in glans penis  Skin: Negative.  Negative for rash.  Neurological:  Negative for dizziness and headaches.  All other systems reviewed and are negative.  Today's Vitals   06/29/21 1001  BP: 132/88  Pulse: 63  Temp: 97.9 F (36.6 C)  TempSrc: Oral  SpO2: 99%  Weight: 188 lb (85.3 kg)  Height: 5\' 5"  (1.651 m)   Body mass index is 31.28 kg/m. Wt Readings from Last 3 Encounters:  06/29/21 188 lb (85.3 kg)  06/15/21 198 lb (89.8 kg)  02/24/21 205 lb (93 kg)    Physical Exam Vitals reviewed.  Constitutional:      Appearance: Normal appearance.  HENT:     Head: Normocephalic.     Mouth/Throat:     Mouth: Mucous membranes are moist.      Pharynx: Oropharynx is clear.  Eyes:     Extraocular Movements: Extraocular movements intact.     Conjunctiva/sclera: Conjunctivae normal.     Pupils: Pupils are equal, round, and reactive to light.  Cardiovascular:     Rate and Rhythm: Normal rate and regular rhythm.     Pulses: Normal pulses.     Heart sounds: Normal heart sounds.  Pulmonary:     Effort: Pulmonary effort is normal.     Breath sounds: Normal breath sounds.  Abdominal:     General: Bowel sounds are normal.     Palpations: Abdomen is soft.     Tenderness: There is no abdominal tenderness. There is no guarding.  Genitourinary:    Penis: Uncircumcised.      Comments: Erythematous rash to glans penis compatible with balanitis Musculoskeletal:  General: Normal range of motion.     Cervical back: Normal range of motion and neck supple.  Skin:    General: Skin is warm and dry.     Capillary Refill: Capillary refill takes less than 2 seconds.  Neurological:     General: No focal deficit present.     Mental Status: He is alert and oriented to person, place, and time.    Results for orders placed or performed in visit on 06/29/21 (from the past 24 hour(s))  POCT glycosylated hemoglobin (Hb A1C)     Status: None   Collection Time: 06/29/21 10:14 AM  Result Value Ref Range   Hemoglobin A1C 4.8 4.0 - 5.6 %   HbA1c POC (<> result, manual entry)     HbA1c, POC (prediabetic range)     HbA1c, POC (controlled diabetic range)      ASSESSMENT & PLAN: Problem List Items Addressed This Visit       Cardiovascular and Mediastinum   Secondary esophageal varices without bleeding (HCC)    No clinical bleeding.  No melena. Continue nadolol 20 mg daily.      Relevant Medications   nadolol (CORGARD) 20 MG tablet     Digestive   Alcoholic cirrhosis of liver without ascites (HCC) - Primary    Clinically euvolemic.  Stable vital signs.  No signs of hepatic encephalopathy.  No significant jaundice.  No abdominal pain.   Afebrile. Clinically stable.  No longer drinking alcohol.      Relevant Orders   Comprehensive metabolic panel   Lipid panel   CBC with Differential/Platelet     Other   Hyperglycemia    Globin A1c at 4.8.  Losing weight.  No history of diabetes. Stop metformin. Diet and nutrition discussed. Follow-up in 6 months.      Relevant Orders   POCT glycosylated hemoglobin (Hb A1C) (Completed)   Other Visit Diagnoses     Balanitis       Relevant Medications   clotrimazole-betamethasone (LOTRISONE) cream      Patient Instructions  Stop metformin. Continue nadolol 20 mg daily. Cirrosis Cirrhosis La cirrosis es una lesin de largo plazo (crnica) en el hgado. El hgado es el rgano interno ms grande del cuerpo, y cumple muchas funciones. Este rgano transforma los [employer], elimina las sustancias txicas de la [place] protenas importantes y absorbe las vitaminas necesarias de los alimentos. En la cirrosis, las clulas hepticas son reemplazadas por tejido cicatricial. Esto impide que la sangre circule por el hgado y dificulta que el hgado realice sus funciones. Cules son las causas? Las causas ms comunes de la cirrosis son la hepatitis C y el consumo prolongado de alcohol. Otras causas son las siguientes: Enfermedad por hgado graso no alcohlico [facility]). Esto sucede cuando la grasa se deposita en el hgado por otras causas que no sean el alcohol. Infeccin por hepatitis B. Hepatitis autoinmune. En esta enfermedad, el sistema de defensa del cuerpo (sistema inmunitario) ataca por error las clulas del hgado, lo que causa inflamacin. Enfermedades que [employer] conductos dentro del hgado. Enfermedades hepticas hereditarias, como la hemocromatosis. Esta es una de las enfermedades hepticas hereditarias ms comunes. En esta enfermedad, depsitos de hierro se acumulan en el hgado y otros rganos. Reacciones a determinados medicamentos a Air cabin crew, como  la [place], un medicamento para Insurance underwriter. Infecciones por parsitos. Estas incluyen la esquistosomiasis, que es una enfermedad causada por un platelminto. Contacto prolongado con ciertas sustancias txicas. Estas sustancias txicas incluyen ciertos  solventes orgnicos, tales como cloroformo y tolueno. Qu incrementa el riesgo? Es ms probable que sufra esta afeccin si: Tiene ciertos tipos de hepatitis viral. Consume alcohol en exceso, especialmente si es mujer. Tiene sobrepeso. [place] drogas intravenosas y comparte agujas. Tiene relaciones sexuales sin usar proteccin con alguien que tiene una hepatitis viral. Cules son los signos o sntomas? Es posible que no tenga signos ni sntomas al principio. Puede que los sntomas no se manifiesten hasta que el dao heptico empiece a Theme park manager. Entre los primeros sntomas, se pueden incluir los siguientes: Debilidad y Merchant navy officer (fatiga). Cambios en los patrones de sueo o dificultad para dormir. Picazn. Dolor a Merchandiser, retail parte superior derecha del abdomen. Adelgazamiento y prdida de masa muscular. Nuseas. Prdida del apetito. Entre los sntomas que aparecen en una etapa posterior se pueden incluir los siguientes: Fatiga o debilidad que [place]. Piel y ojos amarillos (ictericia). Acumulacin de lquido en el abdomen (ascitis). Puede notar que la ropa se le aprieta alrededor de la cintura. Aumento de peso e hinchazn de los pies y los tobillos (edema). Dificultad para respirar. Tener hematomas o hemorragias. Vomitar sangre o tener heces negras o con sangre. Confusin mental. Cmo se diagnostica? El mdico puede sospechar la presencia de cirrosis en funcin de los sntomas y la historia clnica, en especial si tiene otras enfermedades o antecedentes de consumo de alcohol. El [employer] har un examen fsico para palparle el hgado y buscar signos de cirrosis. Las pruebas pueden incluir las siguientes: Anlisis de sangre para  Chief Operating Officer lo siguiente: Si tiene hepatitis B o C. La funcin renal. La funcin heptica. Pruebas de diagnstico por imgenes, por ejemplo: Resonancia magntica (RM) o exploracin por tomografa computarizada (TC) para buscar los cambios que se observan en los casos de cirrosis [place]. Ecografa para determinar si el tejido heptico normal est siendo reemplazado por tejido cicatricial. Un procedimiento en el que se [place] una aguja larga para tomar Lauris Poag de tejido heptico para estudiarlo en un laboratorio (biopsia). La biopsia de hgado puede confirmar el diagnstico de cirrosis. Cmo se trata? El tratamiento de esta afeccin depende de la magnitud del dao heptico y qu lo caus. Puede incluir el tratamiento de los sntomas de la cirrosis o de las causas subyacentes, a fin de Engineer, manufacturing el avance del [place]. El tratamiento puede incluir: Cambios de [place] de vida, como: Seguir una dieta saludable. Puede tener que consultar al mdico o a un nutricionista para Runner, broadcasting/film/video de alimentacin. Restringir la ingesta de sal. Mantener un peso saludable. No consumir drogas o alcohol. Tomar medicamentos para: Warehouse manager las infecciones del hgado u otras infecciones. Controlar la picazn. Disminuir la acumulacin de lquido. Reducir ciertas sustancias txicas de la sangre. Reducir el riesgo de hemorragia de los vasos sanguneos agrandados en el estmago o el esfago (vrices). Trasplante de hgado. En este procedimiento, el hgado de un donante se [place] para reemplazar el hgado enfermo. Esto se realiza si la cirrosis ha ocasionado insuficiencia heptica. Otros tratamientos y procedimientos, segn los problemas que usted tenga debido a la cirrosis. Los problemas frecuentes incluyen insuficiencia renal relacionada con el hgado (sndrome hepatorenal). Siga estas instrucciones en su casa:  Tome los medicamentos solamente como se lo haya indicado el mdico. No use medicamentos que sean txicos para  el hgado. Consulte al mdico antes de tomar algn [employer], incluidos los de [address], [place] los antiinflamatorios no esteroideos [place]). Descanse todo lo que sea necesario. Siga una alimentacin bien equilibrada. Limite el consumo de sal  o de agua, si el [employer] que lo haga. No beba alcohol. Esto es especialmente importante si toma paracetamol de manera habitual. Cumpla con todas las visitas de seguimiento. Esto es importante. Comunquese con un mdico si: Tiene fatiga o debilidad que empeora. Se le hinchan las manos, los pies o las piernas, o tiene acumulacin de lquido en el abdomen (ascitis). Tiene fiebre o escalofros. Pierde el apetito. Siente nuseas o vmitos. Tiene ictericia. Se le forman hematomas o sangra con facilidad. Solicite ayuda de inmediato si: Vomita sangre de color rojo brillante o una sustancia parecida a los granos de caf. Tiene [employer]. Nota que las heces son de color negro y de aspecto alquitranado. Comienza a sentirse confundido. Tiene dolor en el pecho o dificultad para respirar. Estos sntomas pueden representar un problema grave que constituye Radio broadcast assistant. No espere a ver si los sntomas desaparecen. Solicite atencin mdica de inmediato. Comunquese con el servicio de emergencias de su localidad (911 en los [place]). No conduzca por sus propios medios [employer]. Resumen La cirrosis es una lesin crnica en el hgado. Las causas ms comunes son la hepatitis C y el consumo prolongado de alcohol. Las pruebas para diagnosticar la cirrosis incluyen anlisis de [place], estudios de diagnstico por imgenes y biopsia del hgado. El tratamiento de la afeccin incluye el tratamiento de la afeccin preexistente. Evite el consumo de alcohol, drogas, sal y los medicamentos que pueden daar el hgado. Busque ayuda de inmediato si vomita sangre de color rojo brillante o una sustancia parecida a los granos de caf. Esta  informacin no tiene Theme park manager el consejo del mdico. Asegrese de hacerle al mdico cualquier pregunta que tenga. Document Revised: 06/23/2020 Document Reviewed: 06/23/2020 Elsevier Patient Education  2022 Elsevier Inc.    Edwina Barth, MD [facility]

## 2021-07-02 ENCOUNTER — Telehealth: Payer: Self-pay | Admitting: Emergency Medicine

## 2021-07-02 DIAGNOSIS — I8510 Secondary esophageal varices without bleeding: Secondary | ICD-10-CM

## 2021-07-02 DIAGNOSIS — N481 Balanitis: Secondary | ICD-10-CM

## 2021-07-02 NOTE — Telephone Encounter (Signed)
Patient says prescriptions were sent to wrong pharmacy  Please send rx for nadolol (CORGARD) 20 MG tablet & clotrimazole-betamethasone (LOTRISONE) cream to correct pharmacy:  [id number] - [address] ([place]), [place] - [address]  Phone:  [phone] Fax:  [phone]

## 2021-07-03 MED ORDER — CLOTRIMAZOLE-BETAMETHASONE 1-0.05 % EX CREA: 1.0000 "application " | TOPICAL_CREAM | Freq: Two times a day (BID) | CUTANEOUS | 1 refills | Status: DC

## 2021-07-03 MED ORDER — NADOLOL 20 MG PO TABS: 20.0000 mg | ORAL_TABLET | Freq: Every day | ORAL | 3 refills | Status: DC

## 2021-07-03 NOTE — Telephone Encounter (Signed)
Reordered medication to [facility] pharmacy.

## 2021-10-15 NOTE — Telephone Encounter (Signed)
Erroneous encounter. Please disregard.

## 2021-10-16 ENCOUNTER — Telehealth: Payer: Self-pay | Admitting: Emergency Medicine

## 2021-10-16 NOTE — Telephone Encounter (Signed)
1.Medication Requested: Metformin  2. Pharmacy (Name, Street, [place]): [facility] - [place] ([place]), [place] - [address] Phone:  [phone]  Fax:  [phone]     3. On Med List:   4. Last Visit with PCP:  5. Next visit date with PCP:  Pt is completely out of meds. States it's been a long time. States MD suggested he stop, but blood pressure and blood sugar high.   Agent: Please be advised that RX refills may take up to 3 business days. We ask that you follow-up with your pharmacy.

## 2021-10-21 ENCOUNTER — Ambulatory Visit: Payer: Self-pay | Admitting: Emergency Medicine

## 2021-10-25 ENCOUNTER — Other Ambulatory Visit: Payer: Self-pay | Admitting: Emergency Medicine

## 2021-10-31 ENCOUNTER — Other Ambulatory Visit: Payer: Self-pay | Admitting: Emergency Medicine

## 2021-11-18 IMAGING — CR DG CHEST 2V
2 series · 2 of 2 positions shown · non-contrast
Comparison: None.

CLINICAL DATA: Anemia, fatigue, shortness of breath.

EXAM:
CHEST - 2 VIEW

[chest pa]
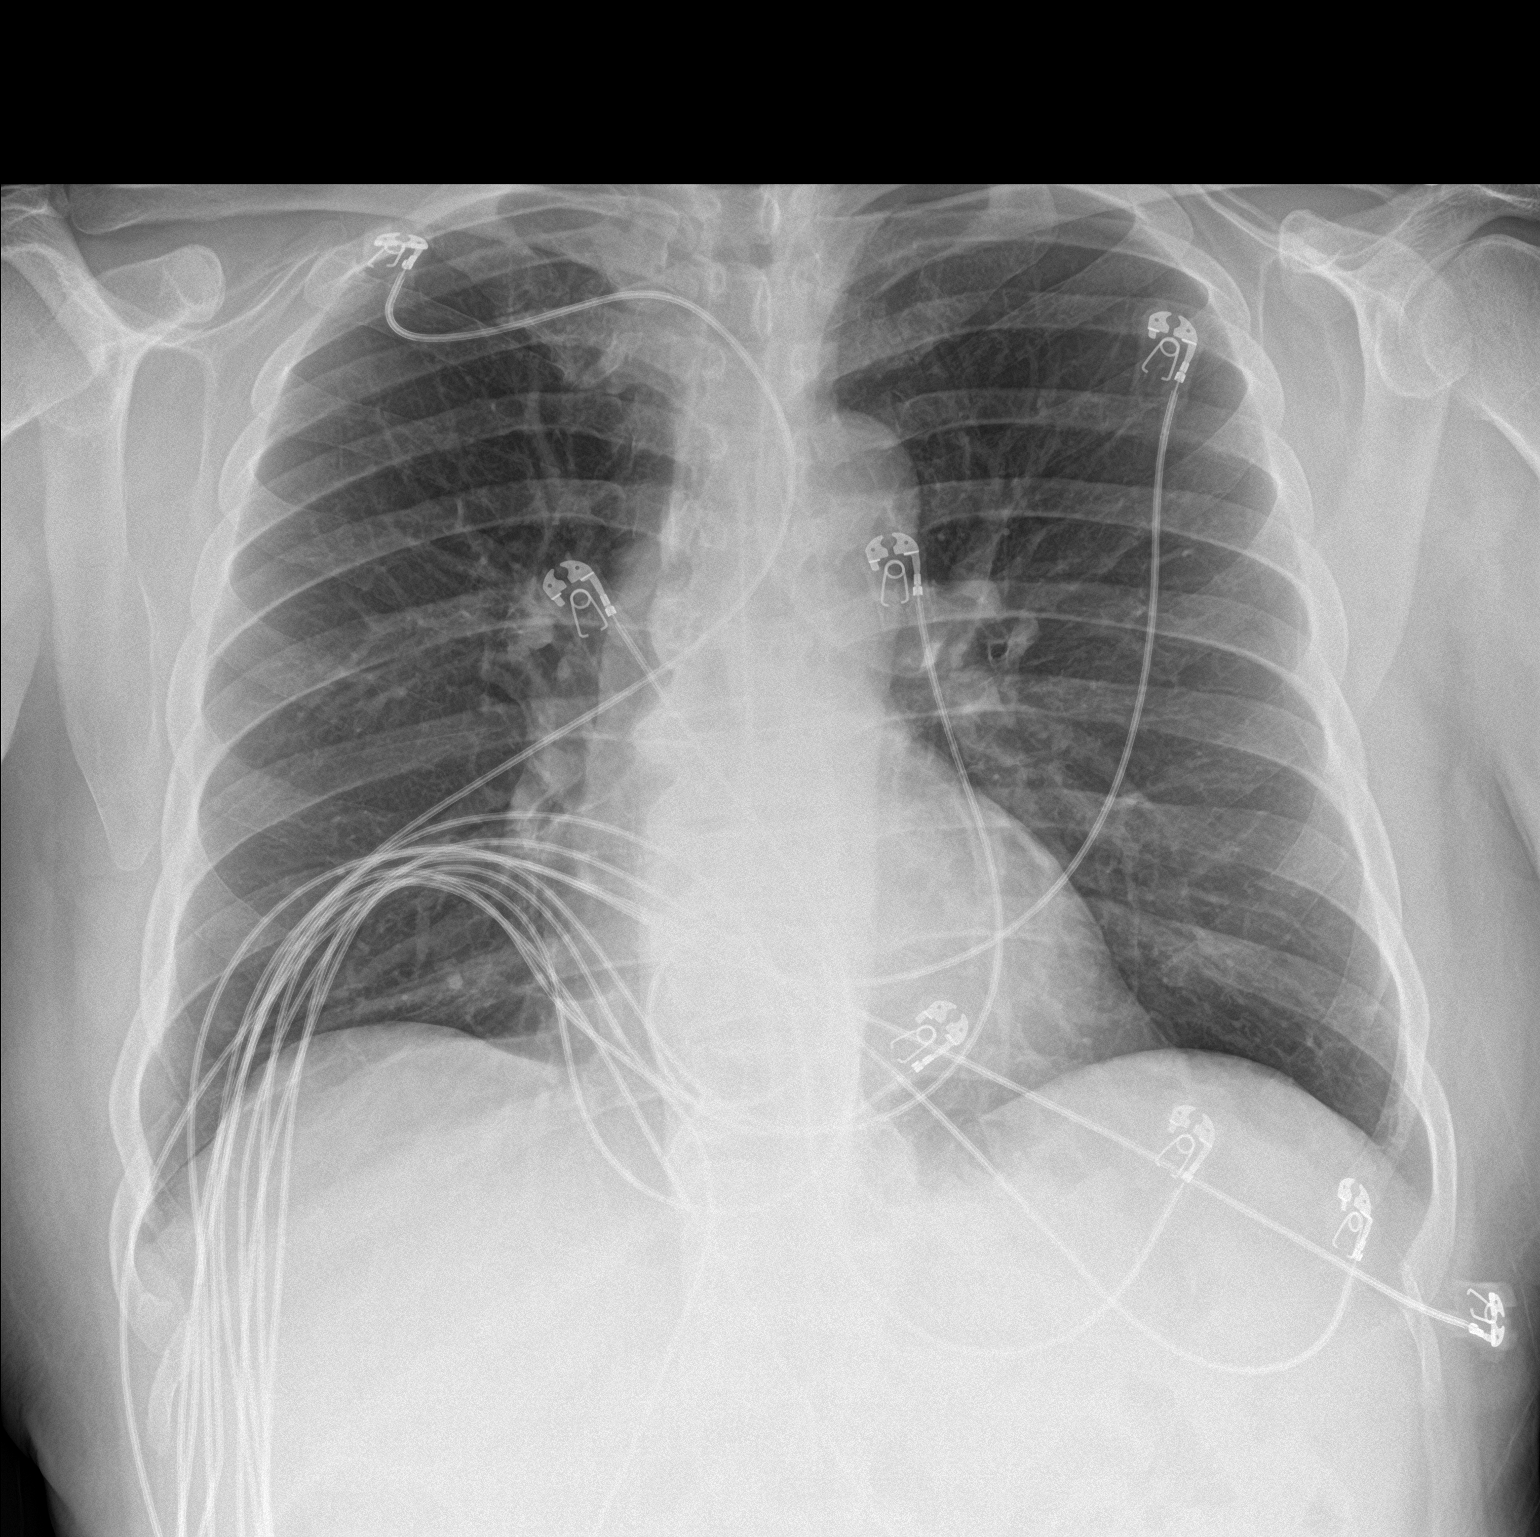

[chest lat]
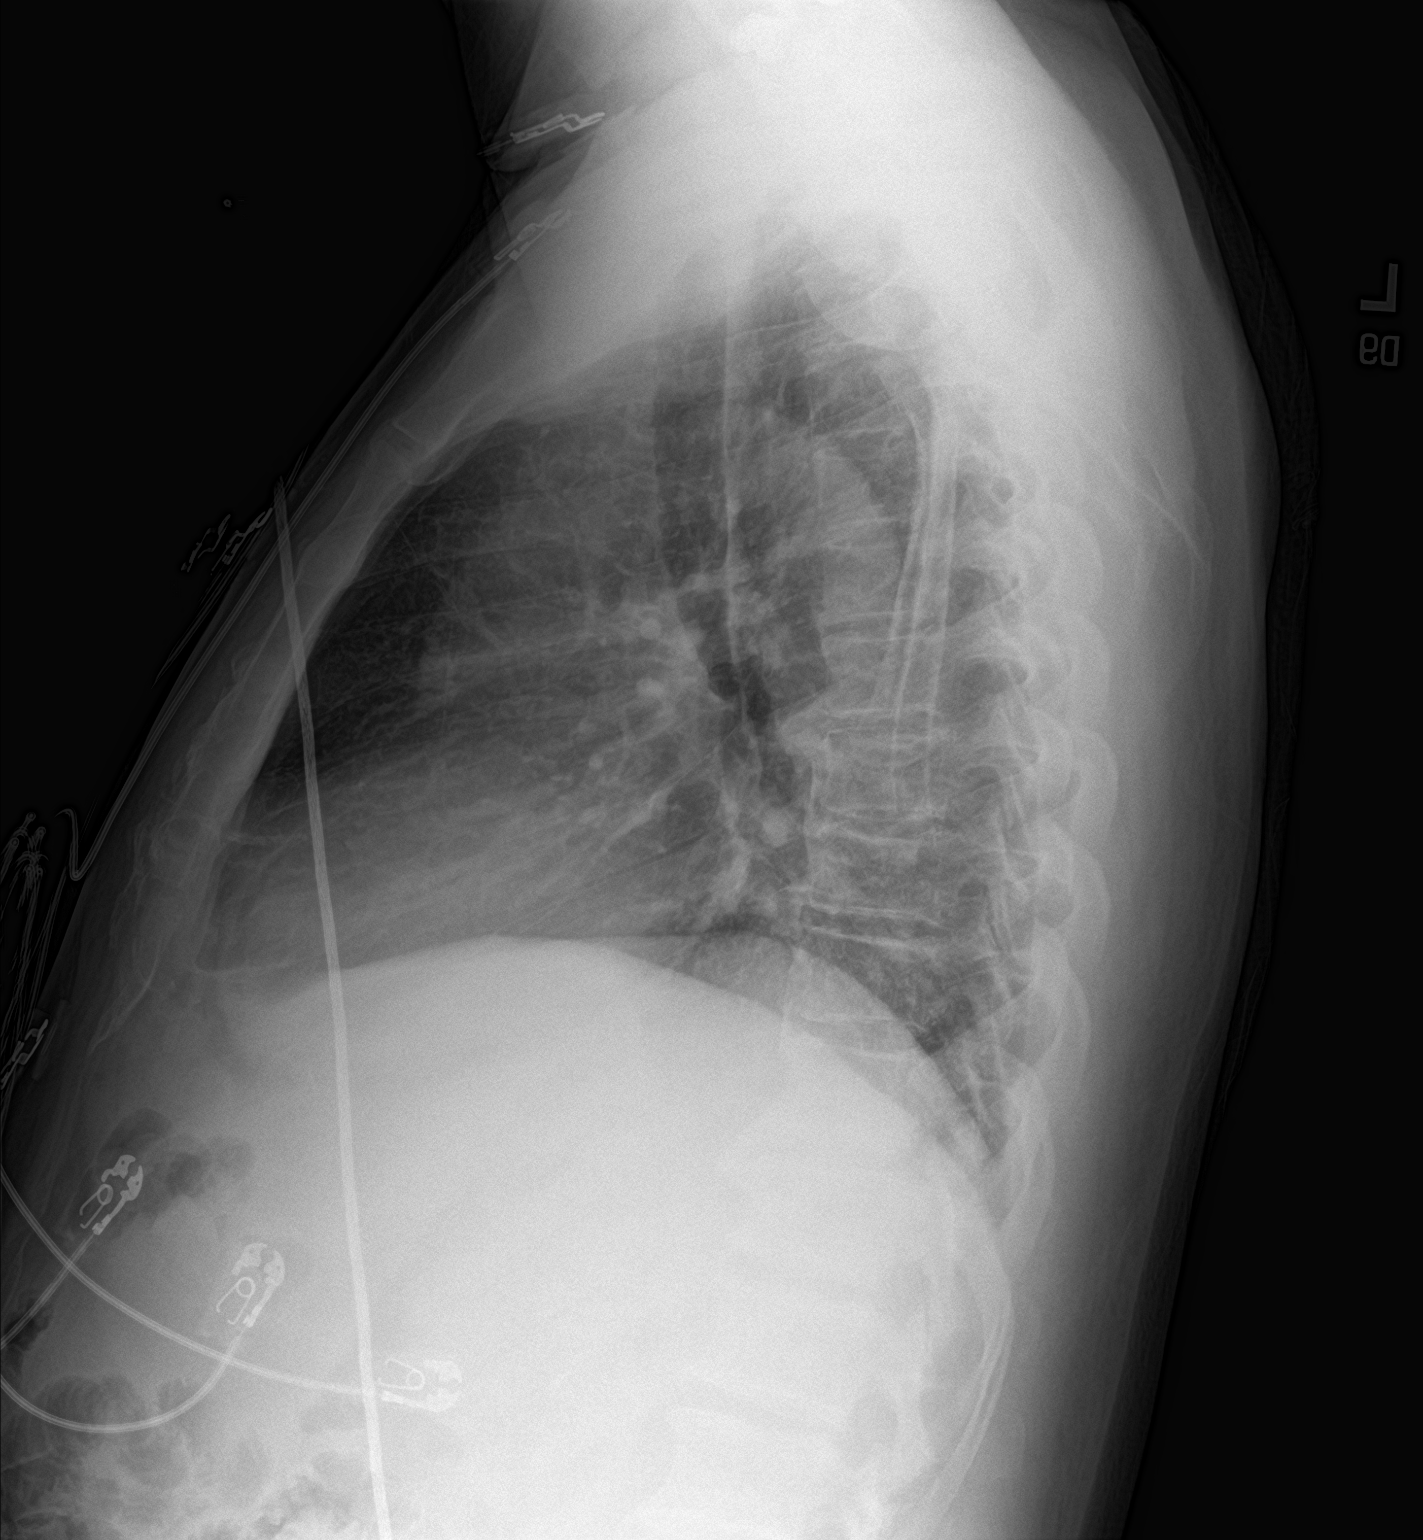

[2 of 2 positions shown; findings below may reference images not displayed]

FINDINGS: The heart size and mediastinal contours are within normal limits.
Both lungs are clear. The visualized skeletal structures are
unremarkable.
IMPRESSION: No active cardiopulmonary disease.

## 2021-12-28 ENCOUNTER — Encounter: Payer: Self-pay | Admitting: Emergency Medicine

## 2021-12-28 ENCOUNTER — Ambulatory Visit (INDEPENDENT_AMBULATORY_CARE_PROVIDER_SITE_OTHER): Payer: Self-pay | Admitting: Emergency Medicine

## 2021-12-28 VITALS — BP 140/78 | HR 77 | Temp 98.2°F | Ht 65.0 in | Wt 185.2 lb

## 2021-12-28 DIAGNOSIS — E1165 Type 2 diabetes mellitus with hyperglycemia: Secondary | ICD-10-CM | POA: Insufficient documentation

## 2021-12-28 DIAGNOSIS — I8510 Secondary esophageal varices without bleeding: Secondary | ICD-10-CM

## 2021-12-28 DIAGNOSIS — K7030 Alcoholic cirrhosis of liver without ascites: Secondary | ICD-10-CM

## 2021-12-28 DIAGNOSIS — N529 Male erectile dysfunction, unspecified: Secondary | ICD-10-CM

## 2021-12-28 MED ORDER — NADOLOL 20 MG PO TABS: 20.0000 mg | ORAL_TABLET | Freq: Every day | ORAL | 3 refills | Status: DC

## 2021-12-28 MED ORDER — SILDENAFIL CITRATE 100 MG PO TABS: 50.0000 mg | ORAL_TABLET | Freq: Every day | ORAL | 11 refills | Status: DC | PRN

## 2021-12-28 MED ORDER — METFORMIN HCL 500 MG PO TABS: 500.0000 mg | ORAL_TABLET | Freq: Two times a day (BID) | ORAL | 3 refills | Status: DC

## 2021-12-28 NOTE — Assessment & Plan Note (Signed)
Clinically stable.  Continue nadolol 20 mg daily for esophageal variceal bleeding control ?

## 2021-12-28 NOTE — Progress Notes (Signed)
[name] ?51 y.o. ? ? ?Chief Complaint  ?Patient presents with  ? Follow-up  ? concern about blood sugar   ?  Patient states blood sugar runs around 200s to 300s   ? ? ?HISTORY OF PRESENT ILLNESS: ?This is a 51 y.o. male complaining of elevated blood sugars at home. ?Presently not taking metformin. ?Has history of alcoholic liver cirrhosis.  Stable. ?No other complaints or medical concerns today. ? ?HPI ? ? ?Prior to Admission medications   ?Medication Sig Start Date End Date Taking? Authorizing Provider  ?clotrimazole-betamethasone (LOTRISONE) cream Apply 1 application topically 2 (two) times daily. 07/03/21  Yes [name], [name], MD  ?diclofenac Sodium (VOLTAREN) 1 % GEL Apply 2 g topically 3 (three) times daily as needed. 06/15/21  Yes [name], MD  ?Multiple Vitamin (MULTIVITAMIN WITH MINERALS) TABS tablet Take 1 tablet by mouth daily. 03/21/20  Yes [name], [name], DO  ?pantoprazole (PROTONIX) 40 MG tablet Take 1 tablet (40 mg total) by mouth daily. 03/30/21  Yes [name], [name], MD  ?thiamine 100 MG tablet Take 1 tablet (100 mg total) by mouth daily. 03/21/20  Yes [name], [name], DO  ?ferrous sulfate 325 (65 FE) MG tablet Take 1 tablet (325 mg total) by mouth daily with breakfast. 04/07/20 06/29/21  [name], MD  ?folic acid (FOLVITE) 1 MG tablet Take 1 tablet (1 mg total) by mouth daily. ?Patient not taking: Reported on 06/29/2021 03/21/20   [name], [name], DO  ?metFORMIN (GLUCOPHAGE) 500 MG tablet Take 1 tablet (500 mg total) by mouth 2 (two) times daily with a meal. 12/28/21   Dailey Buccheri, [name], MD  ?nadolol (CORGARD) 20 MG tablet Take 1 tablet (20 mg total) by mouth daily. 12/28/21 12/23/22  Horald Pollen, MD  ?sildenafil (VIAGRA) 100 MG tablet Take 0.5-1 tablets (50-100 mg total) by mouth daily as needed for erectile dysfunction. 12/28/21   Horald Pollen, MD  ? ? ?No Known Allergies ? ?Patient Active Problem List  ? Diagnosis Date Noted  ? Hyperglycemia  06/29/2021  ? Secondary esophageal varices without bleeding ([place])   ? Rectal varices   ? Portal hypertension (HCC)-colopathy   ? Alcoholic cirrhosis of liver without ascites ([place]) 03/18/2020  ? Pancytopenia ([place])   ? Anemia 12/12/2017  ? Erectile dysfunction 12/12/2017  ? Alcohol abuse 04/23/2016  ? ? ?Past Medical History:  ?Diagnosis Date  ? Alcohol abuse   ? Cirrhosis ([place])   ? Hypertension   ? Portal hypertension (HCC)-colopathy   ? Rhabdomyolysis 04/23/2016  ? Secondary esophageal varices without bleeding ([place])   ? ? ?Past Surgical History:  ?Procedure Laterality Date  ? COLONOSCOPY WITH PROPOFOL N/A 03/19/2020  ? Procedure: COLONOSCOPY WITH PROPOFOL;  Surgeon: [name], MD;  Location: [facility] ENDOSCOPY;  Service: Endoscopy;  Laterality: N/A;  ? ESOPHAGEAL BANDING  03/19/2020  ? Procedure: ESOPHAGEAL BANDING;  Surgeon: [name], MD;  Location: [place];  Service: Endoscopy;;  ? ESOPHAGEAL BANDING N/A 04/07/2020  ? Procedure: ESOPHAGEAL BANDING;  Surgeon: [name], MD;  Location: [facility]COPY;  Service: Endoscopy;  Laterality: N/A;  ? ESOPHAGEAL BANDING N/A 05/19/2020  ? Procedure: ESOPHAGEAL BANDING;  Surgeon: [name], MD;  Location: [facility] ENDOSCOPY;  Service: Endoscopy;  Laterality: N/A;  ? ESOPHAGOGASTRODUODENOSCOPY (EGD) WITH PROPOFOL N/A 03/19/2020  ? Procedure: ESOPHAGOGASTRODUODENOSCOPY (EGD) WITH PROPOFOL;  Surgeon: [name], MD;  Location: [facility] ENDOSCOPY;  Service: Endoscopy;  Laterality: N/A;  ? ESOPHAGOGASTRODUODENOSCOPY (EGD) WITH PROPOFOL N/A 04/07/2020  ? Procedure:  ESOPHAGOGASTRODUODENOSCOPY (EGD) WITH PROPOFOL;  Surgeon: [name], MD;  Location: [facility] ENDOSCOPY;  Service: Endoscopy;  Laterality: N/A;  ? ESOPHAGOGASTRODUODENOSCOPY (EGD) WITH PROPOFOL N/A 05/19/2020  ? Procedure: ESOPHAGOGASTRODUODENOSCOPY (EGD) WITH PROPOFOL;  Surgeon: [name], MD;  Location: [facility] ENDOSCOPY;  Service: Endoscopy;  Laterality: N/A;  ? ? ?Social History  ? ?Socioeconomic History  ? Marital  status: Single  ?  Spouse name: Not on file  ? Number of children: Not on file  ? Years of education: Not on file  ? Highest education level: Not on file  ?Occupational History  ? Not on file  ?Tobacco Use  ? Smoking status: Never  ? Smokeless tobacco: Never  ?Vaping Use  ? Vaping Use: Never used  ?Substance and Sexual Activity  ? Alcohol use: Not Currently  ?  Comment: former etoh abuse, none since july 2021.  ? Drug use: No  ? Sexual activity: Yes  ?Other Topics Concern  ? Not on file  ?Social History Narrative  ? Single  ? Works for a tree service  ? Alcohol abuse hx  ? Never smoker, no drugs  ? ?Social Determinants of Health  ? ?Financial Resource Strain: Not on file  ?Food Insecurity: Not on file  ?Transportation Needs: Not on file  ?Physical Activity: Not on file  ?Stress: Not on file  ?Social Connections: Not on file  ?Intimate Partner Violence: Not on file  ? ? ?Family History  ?Problem Relation Age of Onset  ? Diabetes Mother   ? ? ? ?Review of Systems  ?Constitutional: Negative.  Negative for chills and fever.  ?HENT: Negative.  Negative for congestion and sore throat.   ?Respiratory: Negative.  Negative for cough and shortness of breath.   ?Cardiovascular: Negative.  Negative for chest pain and palpitations.  ?Gastrointestinal:  Negative for abdominal pain, nausea and vomiting.  ?Genitourinary: Negative.   ?Skin: Negative.  Negative for rash.  ?All other systems reviewed and are negative. ?Today's Vitals  ? 12/28/21 1556  ?BP: 140/78  ?Pulse: 77  ?Temp: 98.2 ?F (36.8 ?C)  ?TempSrc: Oral  ?SpO2: 98%  ?Weight: 185 lb 4 oz (84 kg)  ?Height: 5\' 5"  (1.651 m)  ? ?Body mass index is 30.83 kg/m?. ? ? ?Physical Exam ?Vitals reviewed.  ?Constitutional:   ?   Appearance: Normal appearance.  ?HENT:  ?   Head: Normocephalic.  ?Eyes:  ?   Extraocular Movements: Extraocular movements intact.  ?   Pupils: Pupils are equal, round, and reactive to light.  ?Cardiovascular:  ?   Rate and Rhythm: Normal rate and regular  rhythm.  ?   Pulses: Normal pulses.  ?   Heart sounds: Normal heart sounds.  ?Pulmonary:  ?   Effort: Pulmonary effort is normal.  ?   Breath sounds: Normal breath sounds.  ?Abdominal:  ?   Palpations: Abdomen is soft.  ?   Tenderness: There is no abdominal tenderness.  ?Musculoskeletal:  ?   Cervical back: No tenderness.  ?Lymphadenopathy:  ?   Cervical: No cervical adenopathy.  ?Skin: ?   General: Skin is warm and dry.  ?   Capillary Refill: Capillary refill takes less than 2 seconds.  ?Neurological:  ?   General: No focal deficit present.  ?   Mental Status: He is alert and oriented to person, place, and time.  ?Psychiatric:     ?   Mood and Affect: Mood normal.     ?   Behavior: Behavior normal.  ? ? ? ?  ASSESSMENT & PLAN: ?Problem List Items Addressed This Visit   ? ?  ? Cardiovascular and Mediastinum  ? Secondary esophageal varices without bleeding ([place])  ? Relevant Medications  ? nadolol (CORGARD) 20 MG tablet  ? sildenafil (VIAGRA) 100 MG tablet  ?  ? Digestive  ? Alcoholic cirrhosis of liver without ascites ([place])  ?  Clinically stable.  Continue nadolol 20 mg daily for esophageal variceal bleeding control ? ?  ?  ?  ? Endocrine  ? Type 2 diabetes mellitus with hyperglycemia, without long-term current use of insulin (HCC) - Primary  ?  Restart metformin 500 mg twice a day.  Diet and nutrition discussed. ? ?  ?  ? Relevant Medications  ? metFORMIN (GLUCOPHAGE) 500 MG tablet  ? Other Relevant Orders  ? CBC with Differential/Platelet  ? Comprehensive metabolic panel  ? Hemoglobin A1c  ?  ? Other  ? Erectile dysfunction  ? Relevant Medications  ? sildenafil (VIAGRA) 100 MG tablet  ? ?Patient Instructions  ?Diabetes mellitus y nutrici?n, en adultos ?Diabetes Mellitus and Nutrition, Adult ?Si sufre de diabetes, o diabetes mellitus, es muy importante tener h?bitos alimenticios saludables debido a que sus niveles de az?car en la sangre (glucosa) se ven afectados en gran medida por lo que come y bebe. [place] alimentos  saludables en las cantidades correctas, aproximadamente a la misma hora todos los d?as, lo ayudar? a: ?Controlar su glucemia. ?Disminuir el riesgo de sufrir una enfermedad card?aca. ?Mejorar la presi?n arte

## 2021-12-28 NOTE — Assessment & Plan Note (Signed)
Restart metformin 500 mg twice a day.  Diet and nutrition discussed. ?

## 2021-12-28 NOTE — Patient Instructions (Signed)
Diabetes mellitus y nutrici?n, en adultos ?Diabetes Mellitus and Nutrition, Adult ?Si sufre de diabetes, o diabetes mellitus, es muy importante tener h?bitos alimenticios saludables debido a que sus niveles de az?car en la sangre (glucosa) se ven afectados en gran medida por lo que come y bebe. [place] alimentos saludables en las cantidades correctas, aproximadamente a la misma hora todos los d?as, lo ayudar? a: ?Controlar su glucemia. ?Disminuir el riesgo de sufrir una enfermedad card?aca. ?Mejorar la presi?n arterial. ?Alcanzar o mantener un peso saludable. ??Qu? puede afectar mi plan de alimentaci?n? ?Todas las personas que sufren de diabetes son diferentes y cada una tiene necesidades diferentes en cuanto a un plan de alimentaci?n. El m?dico puede recomendarle que trabaje con un nutricionista para elaborar el mejor plan para usted. Su plan de alimentaci?n puede variar seg?n factores como: ?Las calor?as que necesita. ?Los medicamentos que toma. ?Su peso. ?Sus niveles de glucemia, presi?n arterial y colesterol. ?Su nivel de actividad. ?Otras afecciones que tenga, como enfermedades card?acas o renales. ??C?mo me afectan los carbohidratos? ?Los carbohidratos, o hidratos de carbono, afectan su nivel de glucemia m?s que cualquier otro tipo de alimento. La ingesta de carbohidratos aumenta la cantidad de glucosa en la sangre. ?Es importante conocer la cantidad de carbohidratos que se pueden ingerir en cada comida sin correr ning?n riesgo. Esto es diferente en cada persona. Su nutricionista puede ayudarlo a calcular la cantidad de carbohidratos que debe ingerir en cada comida y en cada refrigerio. ??C?mo me afecta el alcohol? ?El alcohol puede provocar una disminuci?n de la glucemia (hipoglucemia), especialmente si usa insulina o toma determinados medicamentos por v?a oral para la diabetes. La hipoglucemia es una afecci?n potencialmente mortal. Los s?ntomas de la hipoglucemia, como somnolencia, mareos y confusi?n, son  similares a los s?ntomas de haber consumido demasiado alcohol. ?No beba alcohol si: ?Su m?dico le indica no hacerlo. ?Est? embarazada, puede estar embarazada o est? tratando de quedar embarazada. ?Si bebe alcohol: ?Limite la cantidad que bebe a lo siguiente: ?De 0 a 1 medida por d?a para las mujeres. ?De 0 a 2 medidas por d?a para los hombres. ?Sepa cu?nta cantidad de alcohol hay en las bebidas que toma. En los [place], una medida equivale a una botella de cerveza de 12 oz (355 ml), un vaso de vino de 5 oz (148 ml) o un vaso de una bebida alcoh?lica de alta graduaci?n de 1? oz (44 ml). ?Mant?ngase hidratado bebiendo agua, refrescos diet?ticos o t? helado sin az?car. Tenga en cuenta que los refrescos comunes, los jugos y otras bebidas para mezclar pueden contener mucha az?car y se deben contar como carbohidratos. ?Consejos para seguir este plan ? ?Leer las etiquetas de los alimentos ?Comience por leer el tama?o de la porci?n en la etiqueta de Informaci?n nutricional de los alimentos envasados y las bebidas. La cantidad de calor?as, carbohidratos, grasas y otros nutrientes detallados en la etiqueta se basan en una porci?n del alimento. Muchos alimentos contienen m?s de una porci?n por envase. ?Verifique la cantidad total de gramos (g) de carbohidratos totales en una porci?n. ?Verifique la cantidad de gramos de grasas saturadas y grasas trans en una porci?n. Escoja alimentos que no contengan estas grasas o que su contenido de estas sea bajo. ?Verifique la cantidad de miligramos (mg) de sal (sodio) en una porci?n. La mayor?a de las personas deben limitar la ingesta de sodio total a menos de 2300 mg por d?a. ?Siempre consulte la informaci?n nutricional de los alimentos etiquetados como ?con bajo contenido de grasa? o ?sin grasa?.   Estos alimentos pueden tener un mayor contenido de az?car agregada o carbohidratos refinados, y deben evitarse. ?Hable con su nutricionista para identificar sus objetivos diarios en  cuanto a los nutrientes mencionados en la etiqueta. ?Al ir de compras ?Evite comprar alimentos procesados, enlatados o precocidos. Estos alimentos tienden a tener una mayor cantidad de grasa, sodio y az?car agregada. ?Compre en la zona exterior de la tienda de comestibles. Esta es la zona donde se encuentran con mayor frecuencia las frutas y las verduras frescas, los cereales a granel, las carnes frescas y los productos l?cteos frescos. ?Al cocinar ?Use m?todos de cocci?n a baja temperatura, como hornear, en lugar de m?todos de cocci?n a alta temperatura, como fre?r en abundante aceite. ?Cocine con aceites saludables, como el aceite de oliva, canola o girasol. ?Evite cocinar con manteca, crema o carnes con alto contenido de grasa. ?Planificaci?n de las comidas ?Coma las comidas y los refrigerios regularmente, preferentemente a la misma hora todos los d?as. Evite pasar largos per?odos de tiempo sin comer. ?Consuma alimentos ricos en fibra, como frutas frescas, verduras, frijoles y cereales integrales. ?Consuma entre 4 y 6 onzas (entre 112 y 168 g) de prote?nas magras por d?a, como carnes magras, pollo, pescado, huevos o tofu. Una onza (oz) (28 g) de prote?na magra equivale a: ?1 onza (28 g) de carne, pollo o pescado. ?1 huevo. ?? taza (62 g) de tofu. ?Coma algunos alimentos por d?a que contengan grasas saludables, como aguacates, frutos secos, semillas y pescado. ??Qu? alimentos debo comer? ?Frutas ?Bayas. Manzanas. Naranjas. Duraznos. Damascos. Ciruelas. Uvas. Mangos. Papayas. Granadas. Kiwi. Cerezas. ?Verduras ?Verduras de hoja verde, que incluyen lechuga, espinaca, col rizada, acelga, hojas de berza, hojas de mostaza y repollo. Remolachas. Coliflor. Br?coli. Zanahorias. Jud?as verdes. Tomates. Pimientos. Cebollas. Pepinos. Coles de Bruselas. ?Granos ?Granos integrales, como panes, galletas, tortillas, cereales y pastas de salvado o integrales. Avena sin az?car. Quinua. Arroz integral o salvaje. ?Carnes y otras  prote?nas ?Frutos de mar. Carne de ave sin piel. Cortes magros de ave y carne de res. Tofu. Frutos secos. Semillas. ?L?cteos ?Productos l?cteos sin grasa o con bajo contenido de grasa, como leche, yogur y queso. ?Es posible que los productos detallados arriba no constituyan una lista completa de los alimentos y las bebidas que puede tomar. Consulte a un nutricionista para obtener m?s informaci?n. ??Qu? alimentos debo evitar? ?Frutas ?Frutas enlatadas al alm?bar. ?Verduras ?Verduras enlatadas. Verduras congeladas con mantequilla o salsa de crema. ?Granos ?Productos elaborados con harina y harina blanca refinada, como panes, pastas, bocadillos y cereales. Evite todos los alimentos procesados. ?Carnes y otras prote?nas ?Cortes de carne con alto contenido de grasa. Carne de ave con piel. Carnes empanizadas o fritas. Carne procesada. Evite las grasas saturadas. ?L?cteos ?Yogur, queso o leche enteros. ?Bebidas ?Bebidas azucaradas, como gaseosas o t? helado. ?Es posible que los productos que se enumeran m?s arriba no constituyan una lista completa de los alimentos y las bebidas que debe evitar. Consulte a un nutricionista para obtener m?s informaci?n. ?Preguntas para hacerle al m?dico ??Debo consultar con un especialista certificado en atenci?n y educaci?n sobre la diabetes? ??Es necesario que me re?na con un nutricionista? ??A qu? n?mero puedo llamar si tengo preguntas? ??Cu?les son los mejores momentos para controlar la glucemia? ?D?nde encontrar m?s informaci?n: ?American Diabetes Association (Asociaci?n [place]ense de la Diabetes): diabetes.org ?Academy of Nutrition and Dietetics (Academia de Nutrici?n y Diet?tica): eatright.org ?National Institute of Diabetes and Digestive and Kidney Diseases (Instituto Nacional de la Diabetes y las Enfermedades Digestivas y Renales): niddk.nih.gov ?Association of Diabetes   Care & Education Specialists (Asociaci?n de Especialistas en Atenci?n y Educaci?n sobre la Diabetes):  diabeteseducator.org ?Resumen ?Es importante tener h?bitos alimenticios saludables debido a que sus niveles de az?car en la sangre (glucosa) se ven afectados en gran medida por lo que come y bebe. Es importante consumir

## 2021-12-29 LAB — COMPREHENSIVE METABOLIC PANEL
ALT: 31 U/L (ref 0–53)
AST: 33 U/L (ref 0–37)
Albumin: 3.4 g/dL — ABNORMAL LOW (ref 3.5–5.2)
Alkaline Phosphatase: 112 U/L (ref 39–117)
BUN: 12 mg/dL (ref 6–23)
CO2: 23 mEq/L (ref 19–32)
Calcium: 9.2 mg/dL (ref 8.4–10.5)
Chloride: 104 mEq/L (ref 96–112)
Creatinine, Ser: 0.67 mg/dL (ref 0.40–1.50)
GFR: 107.71 mL/min (ref >60.00)
Glucose, Bld: 369 mg/dL — ABNORMAL HIGH (ref 70–99)
Potassium: 4.7 mEq/L (ref 3.5–5.1)
Sodium: 134 mEq/L — ABNORMAL LOW (ref 135–145)
Total Bilirubin: 1.5 mg/dL — ABNORMAL HIGH (ref 0.2–1.2)
Total Protein: 8 g/dL (ref 6.0–8.3)

## 2021-12-29 LAB — CBC WITH DIFFERENTIAL/PLATELET
Basophils Absolute: 0 10*3/uL (ref 0.0–0.1)
Basophils Relative: 0.9 % (ref 0.0–3.0)
Eosinophils Absolute: 0.1 10*3/uL (ref 0.0–0.7)
Eosinophils Relative: 5.2 % — ABNORMAL HIGH (ref 0.0–5.0)
HCT: 35.9 % — ABNORMAL LOW (ref 39.0–52.0)
Hemoglobin: 12.1 g/dL — ABNORMAL LOW (ref 13.0–17.0)
Lymphocytes Relative: 15.4 % (ref 12.0–46.0)
Lymphs Abs: 0.4 10*3/uL — ABNORMAL LOW (ref 0.7–4.0)
MCHC: 33.8 g/dL (ref 30.0–36.0)
MCV: 84.6 fl (ref 78.0–100.0)
Monocytes Absolute: 0.3 10*3/uL (ref 0.1–1.0)
Monocytes Relative: 10 % (ref 3.0–12.0)
Neutro Abs: 1.9 10*3/uL (ref 1.4–7.7)
Neutrophils Relative %: 68.5 % (ref 43.0–77.0)
Platelets: 70 10*3/uL — ABNORMAL LOW (ref 150.0–400.0)
RBC: 4.24 Mil/uL (ref 4.22–5.81)
RDW: 15.8 % — ABNORMAL HIGH (ref 11.5–15.5)
WBC: 2.7 10*3/uL — ABNORMAL LOW (ref 4.0–10.5)

## 2021-12-29 LAB — HEMOGLOBIN A1C: Hgb A1c MFr Bld: 9.4 % — ABNORMAL HIGH (ref 4.6–6.5)

## 2022-03-29 ENCOUNTER — Ambulatory Visit: Payer: Self-pay | Admitting: Emergency Medicine

## 2022-05-08 ENCOUNTER — Emergency Department (HOSPITAL_COMMUNITY): Payer: Self-pay

## 2022-05-08 ENCOUNTER — Inpatient Hospital Stay (HOSPITAL_COMMUNITY)
Admission: EM | Admit: 2022-05-08 | Discharge: 2022-05-13 | DRG: 432 | Disposition: A | Discharge status: Home / Self Care | Payer: Self-pay | Attending: Internal Medicine | Admitting: Internal Medicine

## 2022-05-08 ENCOUNTER — Other Ambulatory Visit: Payer: Self-pay

## 2022-05-08 ENCOUNTER — Encounter (HOSPITAL_COMMUNITY): Payer: Self-pay

## 2022-05-08 DIAGNOSIS — I8510 Secondary esophageal varices without bleeding: Secondary | ICD-10-CM

## 2022-05-08 DIAGNOSIS — E8721 Acute metabolic acidosis: Secondary | ICD-10-CM | POA: Diagnosis present

## 2022-05-08 DIAGNOSIS — I8501 Esophageal varices with bleeding: Secondary | ICD-10-CM

## 2022-05-08 DIAGNOSIS — K7030 Alcoholic cirrhosis of liver without ascites: Principal | ICD-10-CM

## 2022-05-08 DIAGNOSIS — K920 Hematemesis: Secondary | ICD-10-CM

## 2022-05-08 DIAGNOSIS — I9589 Other hypotension: Secondary | ICD-10-CM

## 2022-05-08 DIAGNOSIS — I8511 Secondary esophageal varices with bleeding: Secondary | ICD-10-CM

## 2022-05-08 DIAGNOSIS — R55 Syncope and collapse: Secondary | ICD-10-CM

## 2022-05-08 DIAGNOSIS — K766 Portal hypertension: Secondary | ICD-10-CM | POA: Diagnosis present

## 2022-05-08 DIAGNOSIS — Y903 Blood alcohol level of 60-79 mg/100 ml: Secondary | ICD-10-CM | POA: Diagnosis present

## 2022-05-08 DIAGNOSIS — T383X6A Underdosing of insulin and oral hypoglycemic [antidiabetic] drugs, initial encounter: Secondary | ICD-10-CM | POA: Diagnosis present

## 2022-05-08 DIAGNOSIS — F1020 Alcohol dependence, uncomplicated: Secondary | ICD-10-CM | POA: Diagnosis present

## 2022-05-08 DIAGNOSIS — D696 Thrombocytopenia, unspecified: Secondary | ICD-10-CM

## 2022-05-08 DIAGNOSIS — E1165 Type 2 diabetes mellitus with hyperglycemia: Secondary | ICD-10-CM

## 2022-05-08 DIAGNOSIS — E861 Hypovolemia: Secondary | ICD-10-CM

## 2022-05-08 DIAGNOSIS — K2970 Gastritis, unspecified, without bleeding: Secondary | ICD-10-CM | POA: Diagnosis present

## 2022-05-08 DIAGNOSIS — D62 Acute posthemorrhagic anemia: Secondary | ICD-10-CM | POA: Diagnosis present

## 2022-05-08 DIAGNOSIS — I959 Hypotension, unspecified: Secondary | ICD-10-CM | POA: Diagnosis present

## 2022-05-08 DIAGNOSIS — E669 Obesity, unspecified: Secondary | ICD-10-CM | POA: Diagnosis present

## 2022-05-08 DIAGNOSIS — Z79899 Other long term (current) drug therapy: Secondary | ICD-10-CM

## 2022-05-08 DIAGNOSIS — I10 Essential (primary) hypertension: Secondary | ICD-10-CM | POA: Diagnosis present

## 2022-05-08 DIAGNOSIS — K7200 Acute and subacute hepatic failure without coma: Secondary | ICD-10-CM | POA: Diagnosis present

## 2022-05-08 DIAGNOSIS — R040 Epistaxis: Secondary | ICD-10-CM | POA: Diagnosis present

## 2022-05-08 DIAGNOSIS — Z6833 Body mass index (BMI) 33.0-33.9, adult: Secondary | ICD-10-CM

## 2022-05-08 DIAGNOSIS — D649 Anemia, unspecified: Secondary | ICD-10-CM

## 2022-05-08 DIAGNOSIS — Z7984 Long term (current) use of oral hypoglycemic drugs: Secondary | ICD-10-CM

## 2022-05-08 DIAGNOSIS — K209 Esophagitis, unspecified without bleeding: Secondary | ICD-10-CM | POA: Diagnosis present

## 2022-05-08 LAB — COMPREHENSIVE METABOLIC PANEL
ALT: 28 U/L (ref 0–44)
AST: 52 U/L — ABNORMAL HIGH (ref 15–41)
Albumin: 2 g/dL — ABNORMAL LOW (ref 3.5–5.0)
Alkaline Phosphatase: 71 U/L (ref 38–126)
Anion gap: 13 (ref 5–15)
BUN: 8 mg/dL (ref 6–20)
CO2: 15 mmol/L — ABNORMAL LOW (ref 22–32)
Calcium: 6.8 mg/dL — ABNORMAL LOW (ref 8.9–10.3)
Chloride: 110 mmol/L (ref 98–111)
Creatinine, Ser: 0.76 mg/dL (ref 0.61–1.24)
GFR, Estimated: >60 mL/min (ref >60)
Glucose, Bld: 268 mg/dL — ABNORMAL HIGH (ref 70–99)
Potassium: 3.4 mmol/L — ABNORMAL LOW (ref 3.5–5.1)
Sodium: 138 mmol/L (ref 135–145)
Total Bilirubin: 2 mg/dL — ABNORMAL HIGH (ref 0.3–1.2)
Total Protein: 5.1 g/dL — ABNORMAL LOW (ref 6.5–8.1)

## 2022-05-08 LAB — CBC WITH DIFFERENTIAL/PLATELET
Abs Immature Granulocytes: 0.02 10*3/uL (ref 0.00–0.07)
Basophils Absolute: 0 10*3/uL (ref 0.0–0.1)
Basophils Relative: 1 %
Eosinophils Absolute: 0 10*3/uL (ref 0.0–0.5)
Eosinophils Relative: 1 %
HCT: 22.4 % — ABNORMAL LOW (ref 39.0–52.0)
Hemoglobin: 6.6 g/dL — CL (ref 13.0–17.0)
Immature Granulocytes: 0 %
Lymphocytes Relative: 7 %
Lymphs Abs: 0.4 10*3/uL — ABNORMAL LOW (ref 0.7–4.0)
MCH: 24.5 pg — ABNORMAL LOW (ref 26.0–34.0)
MCHC: 29.5 g/dL — ABNORMAL LOW (ref 30.0–36.0)
MCV: 83.3 fL (ref 80.0–100.0)
Monocytes Absolute: 0.6 10*3/uL (ref 0.1–1.0)
Monocytes Relative: 11 %
Neutro Abs: 3.9 10*3/uL (ref 1.7–7.7)
Neutrophils Relative %: 80 %
Platelets: 55 10*3/uL — ABNORMAL LOW (ref 150–400)
RBC: 2.69 MIL/uL — ABNORMAL LOW (ref 4.22–5.81)
RDW: 15.9 % — ABNORMAL HIGH (ref 11.5–15.5)
WBC: 4.8 10*3/uL (ref 4.0–10.5)
nRBC: 0 % (ref 0.0–0.2)

## 2022-05-08 LAB — TROPONIN I (HIGH SENSITIVITY)
Troponin I (High Sensitivity): 8 ng/L (ref <18)
Troponin I (High Sensitivity): 11 ng/L (ref <18)

## 2022-05-08 LAB — PROTIME-INR
INR: 1.9 — ABNORMAL HIGH (ref 0.8–1.2)
Prothrombin Time: 21.7 seconds — ABNORMAL HIGH (ref 11.4–15.2)

## 2022-05-08 LAB — GLUCOSE, CAPILLARY: Glucose-Capillary: 233 mg/dL — ABNORMAL HIGH (ref 70–99)

## 2022-05-08 LAB — PREPARE RBC (CROSSMATCH)

## 2022-05-08 LAB — POC OCCULT BLOOD, ED: Fecal Occult Bld: NEGATIVE

## 2022-05-08 LAB — HEMOGLOBIN AND HEMATOCRIT, BLOOD
HCT: 22.8 % — ABNORMAL LOW (ref 39.0–52.0)
Hemoglobin: 7.2 g/dL — ABNORMAL LOW (ref 13.0–17.0)

## 2022-05-08 LAB — ETHANOL: Alcohol, Ethyl (B): 72 mg/dL — ABNORMAL HIGH (ref <10)

## 2022-05-08 MED ORDER — OCTREOTIDE LOAD VIA INFUSION
50.0000 ug | Freq: Once | INTRAVENOUS | Status: AC
  Administered 2022-05-08: 50 ug via INTRAVENOUS
  Filled 2022-05-08: qty 25

## 2022-05-08 MED ORDER — SODIUM CHLORIDE 0.9% IV SOLUTION: Freq: Once | INTRAVENOUS | Status: AC

## 2022-05-08 MED ORDER — THIAMINE MONONITRATE 100 MG PO TABS
100.0000 mg | ORAL_TABLET | Freq: Every day | ORAL | Status: DC
  Administered 2022-05-09 – 2022-05-13 (×3): 100 mg via ORAL
  Filled 2022-05-08 (×3): qty 1

## 2022-05-08 MED ORDER — SODIUM CHLORIDE 0.9 % IV BOLUS
1000.0000 mL | Freq: Once | INTRAVENOUS | Status: AC
  Administered 2022-05-08: 1000 mL via INTRAVENOUS

## 2022-05-08 MED ORDER — LORAZEPAM 1 MG PO TABS: 1.0000 mg | ORAL_TABLET | ORAL | Status: AC | PRN

## 2022-05-08 MED ORDER — PANTOPRAZOLE 80MG IVPB - SIMPLE MED
80.0000 mg | Freq: Once | INTRAVENOUS | Status: AC
  Administered 2022-05-08: 80 mg via INTRAVENOUS
  Filled 2022-05-08: qty 80

## 2022-05-08 MED ORDER — SODIUM CHLORIDE 0.9 % IV SOLN: INTRAVENOUS | Status: DC

## 2022-05-08 MED ORDER — LORAZEPAM 2 MG/ML IJ SOLN: 1.0000 mg | INTRAMUSCULAR | Status: AC | PRN

## 2022-05-08 MED ORDER — SODIUM CHLORIDE 0.9 % IV SOLN
50.0000 ug/h | INTRAVENOUS | Status: AC
  Administered 2022-05-08 – 2022-05-10 (×6): 50 ug/h via INTRAVENOUS
  Filled 2022-05-08 (×9): qty 1

## 2022-05-08 MED ORDER — INSULIN ASPART 100 UNIT/ML IJ SOLN
0.0000 [IU] | INTRAMUSCULAR | Status: DC
  Administered 2022-05-08 – 2022-05-10 (×9): 3 [IU] via SUBCUTANEOUS
  Administered 2022-05-10: 1 [IU] via SUBCUTANEOUS
  Administered 2022-05-10: 2 [IU] via SUBCUTANEOUS
  Administered 2022-05-10: 1 [IU] via SUBCUTANEOUS
  Administered 2022-05-10: 3 [IU] via SUBCUTANEOUS
  Administered 2022-05-11: 2 [IU] via SUBCUTANEOUS
  Administered 2022-05-11 (×2): 3 [IU] via SUBCUTANEOUS
  Administered 2022-05-11: 1 [IU] via SUBCUTANEOUS
  Administered 2022-05-12: 2 [IU] via SUBCUTANEOUS
  Administered 2022-05-12: 3 [IU] via SUBCUTANEOUS
  Administered 2022-05-12: 2 [IU] via SUBCUTANEOUS
  Administered 2022-05-12: 1 [IU] via SUBCUTANEOUS
  Administered 2022-05-12 – 2022-05-13 (×3): 2 [IU] via SUBCUTANEOUS
  Administered 2022-05-13: 3 [IU] via SUBCUTANEOUS

## 2022-05-08 MED ORDER — SODIUM CHLORIDE 0.9 % IV SOLN: 10.0000 mL/h | Freq: Once | INTRAVENOUS | Status: DC

## 2022-05-08 MED ORDER — PANTOPRAZOLE INFUSION (NEW) - SIMPLE MED
8.0000 mg/h | INTRAVENOUS | Status: AC
  Administered 2022-05-08 – 2022-05-10 (×5): 8 mg/h via INTRAVENOUS
  Filled 2022-05-08 (×4): qty 80
  Filled 2022-05-08 (×2): qty 100
  Filled 2022-05-08: qty 80

## 2022-05-08 MED ORDER — SODIUM CHLORIDE 0.9 % IV SOLN
10.0000 mL/h | Freq: Once | INTRAVENOUS | Status: AC
  Administered 2022-05-08: 10 mL/h via INTRAVENOUS

## 2022-05-08 MED ORDER — SODIUM CHLORIDE 0.9% IV SOLUTION
Freq: Once | INTRAVENOUS | Status: AC
  Administered 2022-05-08: 950 mL via INTRAVENOUS

## 2022-05-08 MED ORDER — ONDANSETRON HCL 4 MG/2ML IJ SOLN
4.0000 mg | INTRAMUSCULAR | Status: DC | PRN
Start: 2022-05-08 — End: 2022-05-13
  Administered 2022-05-09: 4 mg via INTRAVENOUS
  Filled 2022-05-08: qty 2

## 2022-05-08 MED ORDER — THIAMINE HCL 100 MG/ML IJ SOLN
100.0000 mg | Freq: Every day | INTRAMUSCULAR | Status: DC
  Administered 2022-05-10 – 2022-05-11 (×2): 100 mg via INTRAVENOUS
  Filled 2022-05-08 (×4): qty 2

## 2022-05-08 MED ORDER — SODIUM CHLORIDE 0.9 % IV SOLN
1.0000 g | INTRAVENOUS | Status: DC
  Administered 2022-05-08 – 2022-05-12 (×5): 1 g via INTRAVENOUS
  Filled 2022-05-08 (×5): qty 10

## 2022-05-08 NOTE — ED Notes (Signed)
Patient transported to CT 

## 2022-05-08 NOTE — Assessment & Plan Note (Addendum)
Secondary to alcoholic cirrhosis with portal hypertension and ongoing alcohol use -Last EGD with [facility] noted on 05/19/2020 with grade 2 and large esophageal varices with 5 bands placed.  Mild gastritis. -Continue IV Protonix and octreotide infusion -Hemoglobin downward trended to 6.6 from 12.4.  Pt now s/p 3u pRBC and 1u Plt and continues to be hypotensive to SBP 70 with hemametesis. Will give 2 more units of pRBC with 2 units ahead.  -Will transfuse for Hgb of less than 9 due to ongoing hematemesis and risk of brisk bleeding with esophogeal varices. Follow H/H q2hrs  -start IV Rocephin for SBP prophylaxis  -updated GI on ongoing hematemesis. They will see in consultation.

## 2022-05-08 NOTE — ED Triage Notes (Signed)
Pt BIB [facility] d/t 3 syncopal episodes. Pt reports he had been working outside in the heat & has been drinking ETOH & when he woke up he had a small nose bleed. Initial BP was 70/40 for EMS & after 800 CC NS it was 102/74. EKG showed NSR @ 80 bpm. C-collar applied, 20 g PIV in Lt AC, CBG 305, 94% on RA 16 resp. A/Ox4.

## 2022-05-08 NOTE — ED Notes (Signed)
Pt has had 2 bloody emesis occurences in the last 20 minutes. Admitting was sent a message to be made aware.

## 2022-05-08 NOTE — Assessment & Plan Note (Signed)
Reports ongoing alcohol use of up to five 25oz beer daily. Last drink this morning. -place on CIWA protocol

## 2022-05-08 NOTE — Consult Note (Signed)
Referring Provider: EDP, Dr. Lynelle Doctor Primary Care Physician:  Georgina Quint, MD Primary Gastroenterologist:  Dr.Gessner  Reason for Consultation:  GI bleed  HPI: Wesley Williams is a 51 y.o. male with past medical history significant of alcoholic cirrhosis with esophageal varices s/p banding, type 2 diabetes, who presents with concerns of syncope.   Apparently he was leaning against his truck today after doing yard work. He was very diaphoretic and then had hematemesis and nosebleed.  Had alcohol earlier this morning.  Continues to drink at least 3 tall Bud Light daily.  Does not take his beta-blocker or metformin.    On EMS arrival, BP noted to be 85/58 with improvement of blood pressure following 100 cc of bolus.  Hemoglobin downward trended to 6.6 grams from a prior of 12 grams about four several months ago.  Thrombocytopenia with platelet count of 55.  INR 1.9. ALT and AST mildly elevated but at his baseline.  Elevated total bili of 2 also at his baseline.  FOBT is negative without melena per EDP.  BUN normal.   Patient was ordered 2 units of PRBC and 1 units of platelet.  He was started on IV Protonix infusion and IV octreotide.    He later had 2 episodes of hematemesis while in the ED and then another episode upon arrival to the floor.  His blood pressure has been in the 70s-80s.    He denies any previous history of hematemesis.  He underwent serial banding of his varices x3 back in 2021 when he presented with anemia without overt bleeding.  He has not followed up with gastroenterology since September 2021.  The patient denies any symptoms of yellowing of his eyes, leg swelling or abdominal swelling, but the patient's wife says that he has had the symptoms.   EGD 05/2020: - Grade II and large (> 5 mm) esophageal varices with no bleeding and no stigmata of recent bleeding. Completely eradicated. Banded. - Mild Gastritis.  Past Medical History:  Diagnosis Date   Alcohol abuse     Cirrhosis (HCC)    Hypertension    Portal hypertension (HCC)-colopathy    Rhabdomyolysis 04/23/2016   Secondary esophageal varices without bleeding [facility])     Past Surgical History:  Procedure Laterality Date   COLONOSCOPY WITH PROPOFOL N/A 03/19/2020   Procedure: COLONOSCOPY WITH PROPOFOL;  Surgeon: Iva Boop, MD;  [facility] ENDOSCOPY;  Service: Endoscopy;  Laterality: N/A;   ESOPHAGEAL BANDING  03/19/2020   Procedure: ESOPHAGEAL BANDING;  Surgeon: Iva Boop, MD;  Location: [facility] ENDOSCOPY;  Service: Endoscopy;;   ESOPHAGEAL BANDING N/A 04/07/2020   Procedure: ESOPHAGEAL BANDING;  Surgeon: Iva Boop, MD;  Location: [facility] ENDOSCOPY;  Service: Endoscopy;  Laterality: N/A;   ESOPHAGEAL BANDING N/A 05/19/2020   Procedure: ESOPHAGEAL BANDING;  Surgeon: Lynann Bologna, MD;  Location: [facility] ENDOSCOPY;  Service: Endoscopy;  Laterality: N/A;   ESOPHAGOGASTRODUODENOSCOPY (EGD) WITH PROPOFOL N/A 03/19/2020   Procedure: ESOPHAGOGASTRODUODENOSCOPY (EGD) WITH PROPOFOL;  Surgeon: Iva Boop, MD;  Location: [facility] ENDOSCOPY;  Service: Endoscopy;  Laterality: N/A;   ESOPHAGOGASTRODUODENOSCOPY (EGD) WITH PROPOFOL N/A 04/07/2020   Procedure: ESOPHAGOGASTRODUODENOSCOPY (EGD) WITH PROPOFOL;  Surgeon: Iva Boop, MD;  Location: [facility] ENDOSCOPY;  Service: Endoscopy;  Laterality: N/A;   ESOPHAGOGASTRODUODENOSCOPY (EGD) WITH PROPOFOL N/A 05/19/2020   Procedure: ESOPHAGOGASTRODUODENOSCOPY (EGD) WITH PROPOFOL;  Surgeon: Lynann Bologna, MD;  Location: [facility] ENDOSCOPY;  Service: Endoscopy;  Laterality: N/A;    Prior to Admission medications   Medication Sig Start  Date End Date Taking? Authorizing Provider  metFORMIN (GLUCOPHAGE) 500 MG tablet Take 1 tablet (500 mg total) by mouth 2 (two) times daily with a meal. 12/28/21  Yes Sagardia, [name], MD  nadolol (CORGARD) 20 MG tablet Take 1 tablet (20 mg total) by mouth daily. 12/28/21 12/23/22 Yes [place], [name], MD    Current Facility-Administered  Medications  Medication Dose Route Frequency Provider Last Rate Last Admin   0.9 %  sodium chloride infusion   Intravenous Continuous Dorie Rank, MD 125 mL/hr at 05/08/22 1448 New Bag at 05/08/22 1448   0.9 %  sodium chloride infusion  10 mL/hr Intravenous Once Dorie Rank, MD   Held at 05/08/22 1415   cefTRIAXone (ROCEPHIN) 1 g in sodium chloride 0.9 % 100 mL IVPB  1 g Intravenous Q12H Zehr, Jessica D, PA-C       insulin aspart (novoLOG) injection 0-9 Units  0-9 Units Subcutaneous Q4H PRN Tu, Ching T, DO       octreotide (SANDOSTATIN) 500 mcg in sodium chloride 0.9 % 250 mL (2 mcg/mL) infusion  50 mcg/hr Intravenous Continuous Dorie Rank, MD 25 mL/hr at 05/08/22 1452 50 mcg/hr at 05/08/22 1452   pantoprozole (PROTONIX) 80 mg /NS 100 mL infusion  8 mg/hr Intravenous Continuous Dorie Rank, MD 10 mL/hr at 05/08/22 1444 8 mg/hr at 05/08/22 1444   Current Outpatient Medications  Medication Sig Dispense Refill   metFORMIN (GLUCOPHAGE) 500 MG tablet Take 1 tablet (500 mg total) by mouth 2 (two) times daily with a meal. 180 tablet 3   nadolol (CORGARD) 20 MG tablet Take 1 tablet (20 mg total) by mouth daily. 90 tablet 3    Allergies as of 05/08/2022   (No Known Allergies)    Family History  Problem Relation Age of Onset   Diabetes Mother     Social History   Socioeconomic History   Marital status: Single    Spouse name: Not on file   Number of children: Not on file   Years of education: Not on file   Highest education level: Not on file  Occupational History   Not on file  Tobacco Use   Smoking status: Never   Smokeless tobacco: Never  Vaping Use   Vaping Use: Never used  Substance and Sexual Activity   Alcohol use: Not Currently    Comment: former etoh abuse, none since july 2021.   Drug use: No   Sexual activity: Yes  Other Topics Concern   Not on file  Social History Narrative   Single   Works for a tree [employer]   Alcohol abuse hx   Never smoker, no drugs   Social  Determinants of Radio broadcast assistant Strain: Not on file  Food Insecurity: Not on file  Transportation Needs: Not on file  Physical Activity: Not on file  Stress: Not on file  Social Connections: Not on file  Intimate Partner Violence: Not on file    Review of Systems: ROS is O/W negative except as mentioned in HPI.  Physical Exam: Vital signs in last 24 hours: Temp:  [97.4 F (36.3 C)-97.7 F (36.5 C)] 97.4 F (36.3 C) (09/02 1442) Pulse Rate:  [76-118] 82 (09/02 1530) Resp:  [8-26] 19 (09/02 1530) BP: (76-159)/(38-91) 76/38 (09/02 1616) SpO2:  [94 %-100 %] 100 % (09/02 1530)   General:   Alert, but tired and ill-appearing Hispanic male accompanied by daughter and wife, holding emesis bag Head:  Normocephalic and atraumatic. Eyes:  Sclera  clear, faint icterus.   Conjunctiva pale Ears:  Normal auditory acuity. Mouth:  No deformity or lesions.   Lungs:  Clear throughout to auscultation.   No wheezes, crackles, or rhonchi.  Heart:  Regular rate and rhythm; no murmurs, clicks, rubs,  or gallops. Abdomen:  Soft,nontender, BS active,nonpalp mass or hsm.   Rectal:  Deferred  Msk:  Symmetrical without gross deformities. Pulses:  Normal pulses noted. Extremities:  Without clubbing or edema. Neurologic:  Alert and  oriented x4;  grossly normal neurologically.  No somnolence, no asterixis Skin:  Intact without significant lesions or rashes. Psych:  Alert and cooperative. Normal mood and affect.  Lab Results: Recent Labs    05/08/22 1243  WBC 4.8  HGB 6.6*  HCT 22.4*  PLT 55*   BMET Recent Labs    05/08/22 1243  NA 138  K 3.4*  CL 110  CO2 15*  GLUCOSE 268*  BUN 8  CREATININE 0.76  CALCIUM 6.8*   LFT Recent Labs    05/08/22 1243  PROT 5.1*  ALBUMIN 2.0*  AST 52*  ALT 28  ALKPHOS 71  BILITOT 2.0*   PT/INR Recent Labs    05/08/22 1243  LABPROT 21.7*  INR 1.9*   Studies/Results: DG Chest 1 View  Result Date: 05/08/2022 CLINICAL DATA:  Loss  of consciousness. EXAM: CHEST  1 VIEW COMPARISON:  February 24, 2021 FINDINGS: Study is limited due to low volume portable technique. The cardiomediastinal silhouette is unremarkable given technique. No pneumothorax. No nodules or masses. No focal infiltrates. No overt edema. IMPRESSION: The study is limited due to low volume portable technique. No acute abnormalities are identified. Electronically Signed   By: Dorise Bullion III M.D.   On: 05/08/2022 13:40   CT Cervical Spine Wo Contrast  Result Date: 05/08/2022 CLINICAL DATA:  Trauma, syncope EXAM: CT CERVICAL SPINE WITHOUT CONTRAST TECHNIQUE: Multidetector CT imaging of the cervical spine was performed without intravenous contrast. Multiplanar CT image reconstructions were also generated. RADIATION DOSE REDUCTION: This exam was performed according to the departmental dose-optimization program which includes automated exposure control, adjustment of the mA and/or kV according to patient size and/or use of iterative reconstruction technique. COMPARISON:  None Available. FINDINGS: Alignment: Alignment of posterior margins of the vertebral bodies is unremarkable. Skull base and vertebrae: No recent fracture is seen. A 7 mm low-density in the right side of body C2 vertebra without break in the cortical margins, possibly a benign process such as bone cyst. Soft tissues and spinal canal: There is no central spinal stenosis. Disc levels: There is no significant encroachment of neural foramina. Small anterior bony spurs are seen at C5-C6 and C6-C7 levels. Upper chest: Unremarkable. Other: Dense calcifications are seen at both common carotid bifurcations. IMPRESSION: No recent fracture is seen in cervical spine. There is no spinal stenosis. There is no significant encroachment of neural foramina. The 7 mm low-density in the right side of body of C2 vertebra without break in the cortical margins suggesting possible benign process such as bone cyst. Extensive calcifications  are noted at both common carotid bifurcations. Electronically Signed   By: Elmer Picker M.D.   On: 05/08/2022 13:21   CT Head Wo Contrast  Result Date: 05/08/2022 CLINICAL DATA:  Trauma, syncope EXAM: CT HEAD WITHOUT CONTRAST TECHNIQUE: Contiguous axial images were obtained from the base of the skull through the vertex without intravenous contrast. RADIATION DOSE REDUCTION: This exam was performed according to the departmental dose-optimization program which includes automated exposure control,  adjustment of the mA and/or kV according to patient size and/or use of iterative reconstruction technique. COMPARISON:  None Available. FINDINGS: Brain: No acute intracranial findings are seen. There are no signs of bleeding within the cranium. Ventricles are nondilated. Cortical sulci are prominent. In image 14 of series 3, there is 7 mm dense calcification in the central portion of left temporal lobe. There is no significant adjacent edema or focal mass effect. Vascular: Scattered arterial calcifications are seen. Skull: No fracture is seen. Sinuses/Orbits: There is mild mucosal thickening in ethmoid and maxillary sinuses. Defects are seen in the crowns of some other visualized maxillary and mandibular teeth suggesting significant dental disease. Other: None. IMPRESSION: No acute intracranial findings are seen.  Atrophy. There is 7 mm dense calcification in the central portion of left temporal lobe without significant adjacent edema or mass effect. This may suggest healed granuloma. If there are continued symptoms, follow-up MRI should be considered. Mild chronic sinusitis.  Significant dental disease. Electronically Signed   By: Ernie Avena M.D.   On: 05/08/2022 13:15    IMPRESSION:   *Cirrhosis secondary to alcohol with history of esophageal varices, coagulopathy with INR 1.9, thrombocytopenia with platelets of 55 K.  *Upper GI bleed: Had hematemesis in the ED.  BUN is normal currently and he was  Hemoccult negative, but patient having significant hematemesis with low blood pressures..  Hemoglobin is down 6.6 g, which is about half of what it was 4 months ago.  He is being transfused with packed red blood cells.  Has history of grade 2 and large esophageal varices that were banded to eradication in September 2021.  No repeat EGD since that time.  *Alcohol abuse: Had beer this morning.  PLAN: -EGD tomorrow morning -He was started on octreotide and PPI drip in the ED.  I have added Rocephin for prophylaxis. -We will check an AFP in the morning. -Right upper quadrant ultrasound/Dopplers during this admission to screen for [facility] and rule out portal vein thrombosis. -Transfuse to goal hemoglobin 7, patient may need platelets and FFP if bleeding not slowing overnight - Zofran as needed for nausea   Juniel Groene E. Tomasa Rand, MD [facility]

## 2022-05-08 NOTE — ED Notes (Signed)
Benita Gutter T, DO at bedside & aware of the large amount of hematemesis pt is having & gave verbal order to bolus his 2nd unit of blood.

## 2022-05-08 NOTE — Assessment & Plan Note (Addendum)
Secondary to symptomatic anemia from likely esophageal variceal bleed.  Continue management as above. -goal of SBP >90 and MAP of 65

## 2022-05-08 NOTE — H&P (View-Only) (Signed)
Referring Provider: EDP, Dr. Lynelle Doctor Primary Care Physician:  Georgina Quint, MD Primary Gastroenterologist:  Dr.Gessner  Reason for Consultation:  GI bleed  HPI: Wesley Williams is a 51 y.o. male with past medical history significant of alcoholic cirrhosis with esophageal varices s/p banding, type 2 diabetes, who presents with concerns of syncope.   Apparently he was leaning against his truck today after doing yard work. He was very diaphoretic and then had hematemesis and nosebleed.  Had alcohol earlier this morning.  Continues to drink at least 3 tall Bud Light daily.  Does not take his beta-blocker or metformin.    On EMS arrival, BP noted to be 85/58 with improvement of blood pressure following 100 cc of bolus.  Hemoglobin downward trended to 6.6 grams from a prior of 12 grams about four several months ago.  Thrombocytopenia with platelet count of 55.  INR 1.9. ALT and AST mildly elevated but at his baseline.  Elevated total bili of 2 also at his baseline.  FOBT is negative without melena per EDP.  BUN normal.   Patient was ordered 2 units of PRBC and 1 units of platelet.  He was started on IV Protonix infusion and IV octreotide.    He later had 2 episodes of hematemesis while in the ED and then another episode upon arrival to the floor.  His blood pressure has been in the 70s-80s.    He denies any previous history of hematemesis.  He underwent serial banding of his varices x3 back in 2021 when he presented with anemia without overt bleeding.  He has not followed up with gastroenterology since September 2021.  The patient denies any symptoms of yellowing of his eyes, leg swelling or abdominal swelling, but the patient's wife says that he has had the symptoms.   EGD 05/2020: - Grade II and large (> 5 mm) esophageal varices with no bleeding and no stigmata of recent bleeding. Completely eradicated. Banded. - Mild Gastritis.  Past Medical History:  Diagnosis Date   Alcohol abuse     Cirrhosis (HCC)    Hypertension    Portal hypertension (HCC)-colopathy    Rhabdomyolysis 04/23/2016   Secondary esophageal varices without bleeding [facility])     Past Surgical History:  Procedure Laterality Date   COLONOSCOPY WITH PROPOFOL N/A 03/19/2020   Procedure: COLONOSCOPY WITH PROPOFOL;  Surgeon: Iva Boop, MD;  [facility];  Service: Endoscopy;  Laterality: N/A;   ESOPHAGEAL BANDING  03/19/2020   Procedure: ESOPHAGEAL BANDING;  Surgeon: Iva Boop, MD;  Location: [facility] ENDOSCOPY;  Service: Endoscopy;;   ESOPHAGEAL BANDING N/A 04/07/2020   Procedure: ESOPHAGEAL BANDING;  Surgeon: Iva Boop, MD;  Location: [facility] ENDOSCOPY;  Service: Endoscopy;  Laterality: N/A;   ESOPHAGEAL BANDING N/A 05/19/2020   Procedure: ESOPHAGEAL BANDING;  Surgeon: Lynann Bologna, MD;  Location: [facility] ENDOSCOPY;  Service: Endoscopy;  Laterality: N/A;   ESOPHAGOGASTRODUODENOSCOPY (EGD) WITH PROPOFOL N/A 03/19/2020   Procedure: ESOPHAGOGASTRODUODENOSCOPY (EGD) WITH PROPOFOL;  Surgeon: Iva Boop, MD;  Location: [facility] ENDOSCOPY;  Service: Endoscopy;  Laterality: N/A;   ESOPHAGOGASTRODUODENOSCOPY (EGD) WITH PROPOFOL N/A 04/07/2020   Procedure: ESOPHAGOGASTRODUODENOSCOPY (EGD) WITH PROPOFOL;  Surgeon: Iva Boop, MD;  Location: [facility] ENDOSCOPY;  Service: Endoscopy;  Laterality: N/A;   ESOPHAGOGASTRODUODENOSCOPY (EGD) WITH PROPOFOL N/A 05/19/2020   Procedure: ESOPHAGOGASTRODUODENOSCOPY (EGD) WITH PROPOFOL;  Surgeon: Lynann Bologna, MD;  Location: [facility] ENDOSCOPY;  Service: Endoscopy;  Laterality: N/A;    Prior to Admission medications   Medication Sig Start  Date End Date Taking? Authorizing Provider  metFORMIN (GLUCOPHAGE) 500 MG tablet Take 1 tablet (500 mg total) by mouth 2 (two) times daily with a meal. 12/28/21  Yes Sagardia, Miguel Courage, MD  nadolol (CORGARD) 20 MG tablet Take 1 tablet (20 mg total) by mouth daily. 12/28/21 12/23/22 Yes Sagardia, Miguel Nasiah, MD    Current Facility-Administered  Medications  Medication Dose Route Frequency Provider Last Rate Last Admin   0.9 %  sodium chloride infusion   Intravenous Continuous Knapp, Jon, MD 125 mL/hr at 05/08/22 1448 New Bag at 05/08/22 1448   0.9 %  sodium chloride infusion  10 mL/hr Intravenous Once Knapp, Jon, MD   Held at 05/08/22 1415   cefTRIAXone (ROCEPHIN) 1 g in sodium chloride 0.9 % 100 mL IVPB  1 g Intravenous Q12H Zehr, Jessica D, PA-C       insulin aspart (novoLOG) injection 0-9 Units  0-9 Units Subcutaneous Q4H PRN Tu, Ching T, DO       octreotide (SANDOSTATIN) 500 mcg in sodium chloride 0.9 % 250 mL (2 mcg/mL) infusion  50 mcg/hr Intravenous Continuous Knapp, Jon, MD 25 mL/hr at 05/08/22 1452 50 mcg/hr at 05/08/22 1452   pantoprozole (PROTONIX) 80 mg /NS 100 mL infusion  8 mg/hr Intravenous Continuous Knapp, Jon, MD 10 mL/hr at 05/08/22 1444 8 mg/hr at 05/08/22 1444   Current Outpatient Medications  Medication Sig Dispense Refill   metFORMIN (GLUCOPHAGE) 500 MG tablet Take 1 tablet (500 mg total) by mouth 2 (two) times daily with a meal. 180 tablet 3   nadolol (CORGARD) 20 MG tablet Take 1 tablet (20 mg total) by mouth daily. 90 tablet 3    Allergies as of 05/08/2022   (No Known Allergies)    Family History  Problem Relation Age of Onset   Diabetes Mother     Social History   Socioeconomic History   Marital status: Single    Spouse name: Not on file   Number of children: Not on file   Years of education: Not on file   Highest education level: Not on file  Occupational History   Not on file  Tobacco Use   Smoking status: Never   Smokeless tobacco: Never  Vaping Use   Vaping Use: Never used  Substance and Sexual Activity   Alcohol use: Not Currently    Comment: former etoh abuse, none since july 2021.   Drug use: No   Sexual activity: Yes  Other Topics Concern   Not on file  Social History Narrative   Single   Works for a [employer]   Alcohol abuse hx   Never smoker, no drugs   Social  Determinants of Health   Financial Resource Strain: Not on file  Food Insecurity: Not on file  Transportation Needs: Not on file  Physical Activity: Not on file  Stress: Not on file  Social Connections: Not on file  Intimate Partner Violence: Not on file    Review of Systems: ROS is O/W negative except as mentioned in HPI.  Physical Exam: Vital signs in last 24 hours: Temp:  [97.4 F (36.3 C)-97.7 F (36.5 C)] 97.4 F (36.3 C) (09/02 1442) Pulse Rate:  [76-118] 82 (09/02 1530) Resp:  [8-26] 19 (09/02 1530) BP: (76-159)/(38-91) 76/38 (09/02 1616) SpO2:  [94 %-100 %] 100 % (09/02 1530)   General:   Alert, but tired and ill-appearing Hispanic male accompanied by daughter and wife, holding emesis bag Head:  Normocephalic and atraumatic. Eyes:  Sclera   clear, faint icterus.   Conjunctiva pale Ears:  Normal auditory acuity. Mouth:  No deformity or lesions.   Lungs:  Clear throughout to auscultation.   No wheezes, crackles, or rhonchi.  Heart:  Regular rate and rhythm; no murmurs, clicks, rubs,  or gallops. Abdomen:  Soft,nontender, BS active,nonpalp mass or hsm.   Rectal:  Deferred  Msk:  Symmetrical without gross deformities. Pulses:  Normal pulses noted. Extremities:  Without clubbing or edema. Neurologic:  Alert and  oriented x4;  grossly normal neurologically.  No somnolence, no asterixis Skin:  Intact without significant lesions or rashes. Psych:  Alert and cooperative. Normal mood and affect.  Lab Results: Recent Labs    05/08/22 1243  WBC 4.8  HGB 6.6*  HCT 22.4*  PLT 55*   BMET Recent Labs    05/08/22 1243  NA 138  K 3.4*  CL 110  CO2 15*  GLUCOSE 268*  BUN 8  CREATININE 0.76  CALCIUM 6.8*   LFT Recent Labs    05/08/22 1243  PROT 5.1*  ALBUMIN 2.0*  AST 52*  ALT 28  ALKPHOS 71  BILITOT 2.0*   PT/INR Recent Labs    05/08/22 1243  LABPROT 21.7*  INR 1.9*   Studies/Results: DG Chest 1 View  Result Date: 05/08/2022 CLINICAL DATA:  Loss  of consciousness. EXAM: CHEST  1 VIEW COMPARISON:  February 24, 2021 FINDINGS: Study is limited due to low volume portable technique. The cardiomediastinal silhouette is unremarkable given technique. No pneumothorax. No nodules or masses. No focal infiltrates. No overt edema. IMPRESSION: The study is limited due to low volume portable technique. No acute abnormalities are identified. Electronically Signed   By: David  Williams III M.D.   On: 05/08/2022 13:40   CT Cervical Spine Wo Contrast  Result Date: 05/08/2022 CLINICAL DATA:  Trauma, syncope EXAM: CT CERVICAL SPINE WITHOUT CONTRAST TECHNIQUE: Multidetector CT imaging of the cervical spine was performed without intravenous contrast. Multiplanar CT image reconstructions were also generated. RADIATION DOSE REDUCTION: This exam was performed according to the departmental dose-optimization program which includes automated exposure control, adjustment of the mA and/or kV according to patient size and/or use of iterative reconstruction technique. COMPARISON:  None Available. FINDINGS: Alignment: Alignment of posterior margins of the vertebral bodies is unremarkable. Skull base and vertebrae: No recent fracture is seen. A 7 mm low-density in the right side of body C2 vertebra without break in the cortical margins, possibly a benign process such as bone cyst. Soft tissues and spinal canal: There is no central spinal stenosis. Disc levels: There is no significant encroachment of neural foramina. Small anterior bony spurs are seen at C5-C6 and C6-C7 levels. Upper chest: Unremarkable. Other: Dense calcifications are seen at both common carotid bifurcations. IMPRESSION: No recent fracture is seen in cervical spine. There is no spinal stenosis. There is no significant encroachment of neural foramina. The 7 mm low-density in the right side of body of C2 vertebra without break in the cortical margins suggesting possible benign process such as bone cyst. Extensive calcifications  are noted at both common carotid bifurcations. Electronically Signed   By: Palani  Rathinasamy M.D.   On: 05/08/2022 13:21   CT Head Wo Contrast  Result Date: 05/08/2022 CLINICAL DATA:  Trauma, syncope EXAM: CT HEAD WITHOUT CONTRAST TECHNIQUE: Contiguous axial images were obtained from the base of the skull through the vertex without intravenous contrast. RADIATION DOSE REDUCTION: This exam was performed according to the departmental dose-optimization program which includes automated exposure control,   adjustment of the mA and/or kV according to patient size and/or use of iterative reconstruction technique. COMPARISON:  None Available. FINDINGS: Brain: No acute intracranial findings are seen. There are no signs of bleeding within the cranium. Ventricles are nondilated. Cortical sulci are prominent. In image 14 of series 3, there is 7 mm dense calcification in the central portion of left temporal lobe. There is no significant adjacent edema or focal mass effect. Vascular: Scattered arterial calcifications are seen. Skull: No fracture is seen. Sinuses/Orbits: There is mild mucosal thickening in ethmoid and maxillary sinuses. Defects are seen in the crowns of some other visualized maxillary and mandibular teeth suggesting significant dental disease. Other: None. IMPRESSION: No acute intracranial findings are seen.  Atrophy. There is 7 mm dense calcification in the central portion of left temporal lobe without significant adjacent edema or mass effect. This may suggest healed granuloma. If there are continued symptoms, follow-up MRI should be considered. Mild chronic sinusitis.  Significant dental disease. Electronically Signed   By: Ernie Avena M.D.   On: 05/08/2022 13:15    IMPRESSION:   *Cirrhosis secondary to alcohol with history of esophageal varices, coagulopathy with INR 1.9, thrombocytopenia with platelets of 55 K.  *Upper GI bleed: Had hematemesis in the ED.  BUN is normal currently and he was  Hemoccult negative, but patient having significant hematemesis with low blood pressures..  Hemoglobin is down 6.6 g, which is about half of what it was 4 months ago.  He is being transfused with packed red blood cells.  Has history of grade 2 and large esophageal varices that were banded to eradication in September 2021.  No repeat EGD since that time.  *Alcohol abuse: Had beer this morning.  PLAN: -EGD tomorrow morning -He was started on octreotide and PPI drip in the ED.  I have added Rocephin for prophylaxis. -We will check an AFP in the morning. -Right upper quadrant ultrasound/Dopplers during this admission to screen for [facility] and rule out portal vein thrombosis. -Transfuse to goal hemoglobin 7, patient may need platelets and FFP if bleeding not slowing overnight - Zofran as needed for nausea   Shallyn Constancio E. Tomasa Rand, MD [facility]

## 2022-05-08 NOTE — H&P (Addendum)
History and Physical    Patient: Wesley Williams NTZ:[id number] DOB: [birth year] DOA: 05/08/2022 DOS: the patient was seen and examined on 05/08/2022 PCP: Georgina Quint, MD  Patient coming from: Home  Chief Complaint:  Chief Complaint  Patient presents with   Syncopal Episode   Etoh   HPI: Wesley Williams is a 51 y.o. male with medical history significant of alcoholic cirrhosis with esophageal varices s/p banding, type 2 diabetes, chronic thrombocytopenia who presents with concerns of syncope.  Wife provides hx at bedside with daughter translating. She saw him leaning against his truck today after doing yard work. He was very diaphoretic and then had hematemesis and nosebleed.  Had alcohol earlier this morning.  Continues to drink at least 2 tall Bud Light daily.  Does not take his beta-blocker or metformin.  Last episode of hematemesis was about 2 years ago with esophageal variceal bleed requiring banding and has not follow-up for cirrhosis since then.  On EMS arrival, BP noted to be 85/58 with improvement of blood pressure following 100 cc of bolus.  Remains normotensive in the ED.  Hemoglobin downward trended to 6.6 from a prior of 12.4 several months ago.  Thrombocytopenia platelet of 55 which is chronic. ALT and AST mildly elevated but at his baseline.  Elevated total bili of 2 also at his baseline.  FOBT is negative without any melanotic stool.  Patient was ordered 2 units of PRBC and 1 units of platelet.  He was started on IV Protonix infusion and IV octreotide.  He later had 2 episodes of hematemesis while in the ED.  [facility] was consulted.  Hospitalist was then consulted for admission.  Review of Systems: As mentioned in the history of present illness. All other systems reviewed and are negative. Past Medical History:  Diagnosis Date   Alcohol abuse    Cirrhosis (HCC)    Hypertension    Portal hypertension (HCC)-colopathy    Rhabdomyolysis 04/23/2016   Secondary esophageal  varices without bleeding [facility])    Past Surgical History:  Procedure Laterality Date   COLONOSCOPY WITH PROPOFOL N/A 03/19/2020   Procedure: COLONOSCOPY WITH PROPOFOL;  Surgeon: Iva Boop, MD;  Location: [facility] ENDOSCOPY;  Service: Endoscopy;  Laterality: N/A;   ESOPHAGEAL BANDING  03/19/2020   Procedure: ESOPHAGEAL BANDING;  Surgeon: Iva Boop, MD;  Location: [facility] ENDOSCOPY;  Service: Endoscopy;;   ESOPHAGEAL BANDING N/A 04/07/2020   Procedure: ESOPHAGEAL BANDING;  Surgeon: Iva Boop, MD;  Location: [facility] ENDOSCOPY;  Service: Endoscopy;  Laterality: N/A;   ESOPHAGEAL BANDING N/A 05/19/2020   Procedure: ESOPHAGEAL BANDING;  Surgeon: Lynann Bologna, MD;  Location: [facility] ENDOSCOPY;  Service: Endoscopy;  Laterality: N/A;   ESOPHAGOGASTRODUODENOSCOPY (EGD) WITH PROPOFOL N/A 03/19/2020   Procedure: ESOPHAGOGASTRODUODENOSCOPY (EGD) WITH PROPOFOL;  Surgeon: Iva Boop, MD;  Location: [facility] ENDOSCOPY;  Service: Endoscopy;  Laterality: N/A;   ESOPHAGOGASTRODUODENOSCOPY (EGD) WITH PROPOFOL N/A 04/07/2020   Procedure: ESOPHAGOGASTRODUODENOSCOPY (EGD) WITH PROPOFOL;  Surgeon: Iva Boop, MD;  Location: [facility] ENDOSCOPY;  Service: Endoscopy;  Laterality: N/A;   ESOPHAGOGASTRODUODENOSCOPY (EGD) WITH PROPOFOL N/A 05/19/2020   Procedure: ESOPHAGOGASTRODUODENOSCOPY (EGD) WITH PROPOFOL;  Surgeon: Lynann Bologna, MD;  Location: [facility] ENDOSCOPY;  Service: Endoscopy;  Laterality: N/A;   Social History:  reports that he has never smoked. He has never used smokeless tobacco. He reports that he does not currently use alcohol. He reports that he does not use drugs.  No Known Allergies  Family History  Problem Relation Age of Onset  Diabetes Mother     Prior to Admission medications   Medication Sig Start Date End Date Taking? Authorizing Provider  metFORMIN (GLUCOPHAGE) 500 MG tablet Take 1 tablet (500 mg total) by mouth 2 (two) times daily with a meal. 12/28/21  Yes Sagardia, [name], MD  nadolol (CORGARD) 20  MG tablet Take 1 tablet (20 mg total) by mouth daily. 12/28/21 12/23/22 Yes Sagardia, [name], MD  clotrimazole-betamethasone (LOTRISONE) cream Apply 1 application topically 2 (two) times daily. Patient not taking: Reported on 05/08/2022 07/03/21   Horald Pollen, MD  ferrous sulfate 325 (65 FE) MG tablet Take 1 tablet (325 mg total) by mouth daily with breakfast. 04/07/20 06/29/21  [name], MD  folic acid (FOLVITE) 1 MG tablet Take 1 tablet (1 mg total) by mouth daily. Patient not taking: Reported on 05/08/2022 03/21/20   [name], [name], DO  Multiple Vitamin (MULTIVITAMIN WITH MINERALS) TABS tablet Take 1 tablet by mouth daily. 03/21/20   [name], [name], DO  pantoprazole (PROTONIX) 40 MG tablet Take 1 tablet (40 mg total) by mouth daily. 03/30/21   Horald Pollen, MD  sildenafil (VIAGRA) 100 MG tablet Take 0.5-1 tablets (50-100 mg total) by mouth daily as needed for erectile dysfunction. 12/28/21   Horald Pollen, MD  thiamine 100 MG tablet Take 1 tablet (100 mg total) by mouth daily. 03/21/20   Gerlene Fee, DO    Physical Exam: Vitals:   05/08/22 1623 05/08/22 1630 05/08/22 1631 05/08/22 1700  BP: (!) 86/51  (!) 79/68 115/83  Pulse: 99  (!) 104 97  Resp: (!) 23  (!) 21 (!) 27  Temp:   97.7 F (36.5 C)   TempSrc:   Oral   SpO2: 100% 100%  100%   Constitutional: Ill-appearing middle-age obese male laying in bed on his left side with active vomiting and hematemesis Eyes:bilateral scleral icterus. ENMT: Mucous membranes are moist with blood Neck: normal, supple Respiratory: clear to auscultation bilaterally, no wheezing, no crackles. Normal respiratory effort. No accessory muscle use.  Cardiovascular: Regular rate and rhythm, no murmurs / rubs / gallops. No extremity edema.  Abdomen: Soft, mildly distended and nontender abdomen.  Bowel sounds positive.  Musculoskeletal: no clubbing / cyanosis. No joint deformity upper and lower extremities.Normal muscle  tone.  Skin: no rashes, lesions, ulcers. No induration Neurologic: CN 2-12 grossly intact.  Strength 5/5 in all 4.  Psychiatric: Normal judgment and insight. Alert and oriented x 3. Normal mood. Data Reviewed:  See HPI  Assessment and Plan: * Esophageal varices with bleeding ([place]) Secondary to alcoholic cirrhosis with portal hypertension and ongoing alcohol use -Last EGD with  GI noted on 05/19/2020 with grade 2 and large esophageal varices with 5 bands placed.  Mild gastritis. -Continue IV Protonix and octreotide infusion -Hemoglobin downward trended to 6.6 from 12.4.  Pt now s/p 3u pRBC and 1u Plt and continues to be hypotensive to SBP 70 with hemametesis. Will give 2 more units of pRBC with 2 units ahead.  -Will transfuse for Hgb of less than 9 due to ongoing hematemesis and risk of brisk bleeding with esophogeal varices. Follow H/H q2hrs  -start IV Rocephin for SBP prophylaxis  -updated GI on ongoing hematemesis. They will see in consultation.   Hypotension Secondary to symptomatic anemia from likely esophageal variceal bleed.  Continue management as above. -goal of SBP >90 and MAP of 65  Type 2 diabetes mellitus with hyperglycemia, without long-term current use of insulin (HCC) Repeat hemoglobin A1c.  Placed on sensitive sliding scale insulin q4hr.  Alcoholic cirrhosis of liver without ascites (HCC) Reports ongoing alcohol use of up to five 25oz beer daily. Last drink this morning. -place on CIWA protocol      Advance Care Planning:   Code Status: Prior Full  Consults: [facility]  Family Communication: Discussed with wife and daughter at bedside  Severity of Illness: The appropriate patient status for this patient is INPATIENT. Inpatient status is judged to be reasonable and necessary in order to provide the required intensity of service to ensure the patient's safety. The patient's presenting symptoms, physical exam findings, and initial radiographic and laboratory  data in the context of their chronic comorbidities is felt to place them at high risk for further clinical deterioration. Furthermore, it is not anticipated that the patient will be medically stable for discharge from the hospital within 2 midnights of admission.   * I certify that at the point of admission it is my clinical judgment that the patient will require inpatient hospital care spanning beyond 2 midnights from the point of admission due to high intensity of service, high risk for further deterioration and high frequency of surveillance required.*  Author: Anselm Jungling, DO 05/08/2022 6:29 PM  For on call review [email or web address].

## 2022-05-08 NOTE — ED Provider Notes (Signed)
[facility] EMERGENCY DEPARTMENT Provider Note   CSN: [id number] Arrival date & time: 05/08/22  1217     History  Chief Complaint  Patient presents with  . Syncopal Episode  . Etoh    Wesley Williams is a 51 y.o. male.  HPI   Patient presented to the ER for evaluation after syncopal episode.  Patient does have history of alcohol abuse, hypertension, cirrhosis of esophageal varices.  Patient states he was out doing yard work today apparently had 3 syncopal episodes per EMS.  EMS reported that when the patient woke up he had a nosebleed.  Patient tells me that he did drive to the store.  He had a syncopal episode after coming back.  He thinks he may have had an episode of hematemesis prior to the fall.  He does admit to drinking beer this morning.  He does have some mild chest discomfort earlier but not having any now.  He is not have any abdominal pain.  He has not noticed any blood in his stool.  Home Medications Prior to Admission medications   Medication Sig Start Date End Date Taking? Authorizing Provider  clotrimazole-betamethasone (LOTRISONE) cream Apply 1 application topically 2 (two) times daily. Patient not taking: Reported on 05/08/2022 07/03/21   Georgina Quint, MD  ferrous sulfate 325 (65 FE) MG tablet Take 1 tablet (325 mg total) by mouth daily with breakfast. 04/07/20 06/29/21  Iva Boop, MD  folic acid (FOLVITE) 1 MG tablet Take 1 tablet (1 mg total) by mouth daily. Patient not taking: Reported on 05/08/2022 03/21/20   [name], Randa Evens, DO  metFORMIN (GLUCOPHAGE) 500 MG tablet Take 1 tablet (500 mg total) by mouth 2 (two) times daily with a meal. 12/28/21   Sagardia, Eilleen Kempf, MD  Multiple Vitamin (MULTIVITAMIN WITH MINERALS) TABS tablet Take 1 tablet by mouth daily. 03/21/20   [name], Randa Evens, DO  nadolol (CORGARD) 20 MG tablet Take 1 tablet (20 mg total) by mouth daily. 12/28/21 12/23/22  Georgina Quint, MD  pantoprazole (PROTONIX) 40 MG  tablet Take 1 tablet (40 mg total) by mouth daily. 03/30/21   Georgina Quint, MD  sildenafil (VIAGRA) 100 MG tablet Take 0.5-1 tablets (50-100 mg total) by mouth daily as needed for erectile dysfunction. 12/28/21   Georgina Quint, MD  thiamine 100 MG tablet Take 1 tablet (100 mg total) by mouth daily. 03/21/20   [name], Randa Evens, DO      Allergies    Patient has no known allergies.    Review of Systems   Review of Systems  Physical Exam Updated Vital Signs BP 129/79   Pulse 94   Temp 97.7 F (36.5 C) (Oral)   Resp 11   SpO2 100%  Physical Exam Vitals and nursing note reviewed.  Constitutional:      Appearance: He is well-developed. He is not diaphoretic.     Comments: Patient covered in dirt and grass  HENT:     Head: Normocephalic and atraumatic.     Right Ear: External ear normal.     Left Ear: External ear normal.     Mouth/Throat:     Comments: No blood noted in the oropharynx, there is some blood noted around the nares Eyes:     General: No scleral icterus.       Right eye: No discharge.        Left eye: No discharge.     Comments: Pale conjunctive a  Neck:  Trachea: No tracheal deviation.  Cardiovascular:     Rate and Rhythm: Normal rate and regular rhythm.  Pulmonary:     Effort: Pulmonary effort is normal. No respiratory distress.     Breath sounds: Normal breath sounds. No stridor. No wheezing or rales.  Abdominal:     General: Bowel sounds are normal. There is no distension.     Palpations: Abdomen is soft.     Tenderness: There is no abdominal tenderness. There is no guarding or rebound.  Genitourinary:    Comments: No gross blood noted on rectal exam Musculoskeletal:        General: No tenderness or deformity.     Cervical back: Neck supple.     Comments: No spinal tenderness, cervical thoracic or lumbar  Skin:    General: Skin is warm and dry.     Coloration: Skin is pale.     Findings: No rash.  Neurological:     General: No focal  deficit present.     Mental Status: He is alert.     Cranial Nerves: No cranial nerve deficit (no facial droop, extraocular movements intact, no slurred speech).     Sensory: No sensory deficit.     Motor: No abnormal muscle tone or seizure activity.     Coordination: Coordination normal.  Psychiatric:        Mood and Affect: Mood normal.     ED Results / Procedures / Treatments   Labs (all labs ordered are listed, but only abnormal results are displayed) Labs Reviewed  COMPREHENSIVE METABOLIC PANEL - Abnormal; Notable for the following components:      Result Value   Potassium 3.4 (*)    CO2 15 (*)    Glucose, Bld 268 (*)    Calcium 6.8 (*)    Total Protein 5.1 (*)    Albumin 2.0 (*)    AST 52 (*)    Total Bilirubin 2.0 (*)    All other components within normal limits  CBC WITH DIFFERENTIAL/PLATELET - Abnormal; Notable for the following components:   RBC 2.69 (*)    Hemoglobin 6.6 (*)    HCT 22.4 (*)    MCH 24.5 (*)    MCHC 29.5 (*)    RDW 15.9 (*)    Platelets 55 (*)    Lymphs Abs 0.4 (*)    All other components within normal limits  PROTIME-INR - Abnormal; Notable for the following components:   Prothrombin Time 21.7 (*)    INR 1.9 (*)    All other components within normal limits  ETHANOL - Abnormal; Notable for the following components:   Alcohol, Ethyl (B) 72 (*)    All other components within normal limits  POC OCCULT BLOOD, ED  POC OCCULT BLOOD, ED  TYPE AND SCREEN  PREPARE PLATELET PHERESIS  PREPARE RBC (CROSSMATCH)  TROPONIN I (HIGH SENSITIVITY)  TROPONIN I (HIGH SENSITIVITY)    EKG EKG Interpretation  Date/Time:  Saturday May 08 2022 13:17:53 EDT Ventricular Rate:  78 PR Interval:  163 QRS Duration: 88 QT Interval:  417 QTC Calculation: 475 R Axis:   78 Text Interpretation: Sinus rhythm Confirmed by Linwood Dibbles [phone]) on 05/08/2022 2:28:57 PM  Radiology DG Chest 1 View  Result Date: 05/08/2022 CLINICAL DATA:  Loss of consciousness. EXAM:  CHEST  1 VIEW COMPARISON:  February 24, 2021 FINDINGS: Study is limited due to low volume portable technique. The cardiomediastinal silhouette is unremarkable given technique. No pneumothorax. No nodules or masses. No focal  infiltrates. No overt edema. IMPRESSION: The study is limited due to low volume portable technique. No acute abnormalities are identified. Electronically Signed   By: Gerome Sam III M.D.   On: 05/08/2022 13:40   CT Cervical Spine Wo Contrast  Result Date: 05/08/2022 CLINICAL DATA:  Trauma, syncope EXAM: CT CERVICAL SPINE WITHOUT CONTRAST TECHNIQUE: Multidetector CT imaging of the cervical spine was performed without intravenous contrast. Multiplanar CT image reconstructions were also generated. RADIATION DOSE REDUCTION: This exam was performed according to the departmental dose-optimization program which includes automated exposure control, adjustment of the mA and/or kV according to patient size and/or use of iterative reconstruction technique. COMPARISON:  None Available. FINDINGS: Alignment: Alignment of posterior margins of the vertebral bodies is unremarkable. Skull base and vertebrae: No recent fracture is seen. A 7 mm low-density in the right side of body C2 vertebra without break in the cortical margins, possibly a benign process such as bone cyst. Soft tissues and spinal canal: There is no central spinal stenosis. Disc levels: There is no significant encroachment of neural foramina. Small anterior bony spurs are seen at C5-C6 and C6-C7 levels. Upper chest: Unremarkable. Other: Dense calcifications are seen at both common carotid bifurcations. IMPRESSION: No recent fracture is seen in cervical spine. There is no spinal stenosis. There is no significant encroachment of neural foramina. The 7 mm low-density in the right side of body of C2 vertebra without break in the cortical margins suggesting possible benign process such as bone cyst. Extensive calcifications are noted at both common  carotid bifurcations. Electronically Signed   By: Ernie Avena M.D.   On: 05/08/2022 13:21   CT Head Wo Contrast  Result Date: 05/08/2022 CLINICAL DATA:  Trauma, syncope EXAM: CT HEAD WITHOUT CONTRAST TECHNIQUE: Contiguous axial images were obtained from the base of the skull through the vertex without intravenous contrast. RADIATION DOSE REDUCTION: This exam was performed according to the departmental dose-optimization program which includes automated exposure control, adjustment of the mA and/or kV according to patient size and/or use of iterative reconstruction technique. COMPARISON:  None Available. FINDINGS: Brain: No acute intracranial findings are seen. There are no signs of bleeding within the cranium. Ventricles are nondilated. Cortical sulci are prominent. In image 14 of series 3, there is 7 mm dense calcification in the central portion of left temporal lobe. There is no significant adjacent edema or focal mass effect. Vascular: Scattered arterial calcifications are seen. Skull: No fracture is seen. Sinuses/Orbits: There is mild mucosal thickening in ethmoid and maxillary sinuses. Defects are seen in the crowns of some other visualized maxillary and mandibular teeth suggesting significant dental disease. Other: None. IMPRESSION: No acute intracranial findings are seen.  Atrophy. There is 7 mm dense calcification in the central portion of left temporal lobe without significant adjacent edema or mass effect. This may suggest healed granuloma. If there are continued symptoms, follow-up MRI should be considered. Mild chronic sinusitis.  Significant dental disease. Electronically Signed   By: Ernie Avena M.D.   On: 05/08/2022 13:15    Procedures .Critical Care  Performed by: Linwood Dibbles, MD Authorized by: Linwood Dibbles, MD   Critical care provider statement:    Critical care time (minutes):  45     Medications Ordered in ED Medications  sodium chloride 0.9 % bolus 1,000 mL (1,000 mLs  Intravenous New Bag/Given 05/08/22 1318)    And  0.9 %  sodium chloride infusion (has no administration in time range)  0.9 %  sodium chloride infusion (0  mL/hr Intravenous Hold 05/08/22 1415)  0.9 %  sodium chloride infusion (has no administration in time range)  octreotide (SANDOSTATIN) 2 mcg/mL load via infusion 50 mcg (has no administration in time range)    And  octreotide (SANDOSTATIN) 500 mcg in sodium chloride 0.9 % 250 mL (2 mcg/mL) infusion (has no administration in time range)  pantoprazole (PROTONIX) 80 mg /NS 100 mL IVPB (has no administration in time range)  pantoprozole (PROTONIX) 80 mg /NS 100 mL infusion (has no administration in time range)    ED Course/ Medical Decision Making/ A&P Clinical Course as of 05/08/22 1506  Sat May 08, 2022  1355 CBC with Differential(!!) CBC reviewed.  Patient's hemoglobin is decreased at 6.6.  No further hematemesis.  We will proceed with blood transfusions [JK]  1356 CBC with Differential(!!) Thrombocytopenia also noted.  Similar to previous [JK]  1359 Reviewed Hemoccult test with lab.  It was reportedly negative [JK]  1409 Blood pressure improving at 107/68.  Blood transfusions and platelet transfusions ordered [JK]  1422 DIscussed with [facility].  Will consult on patient. [JK]  1505 Case discussed with Dr Cyndia Bent regarding admission [JK]    Clinical Course User Index [JK] Linwood Dibbles, MD                           Medical Decision Making Patient noted to have low blood pressure on arrival.  Differential includes but not limited to acute GI bleeding with hypotension, dehydration ,acute coronary syndrome  Problems Addressed: Anemia, unspecified type: acute illness or injury that poses a threat to life or bodily functions Hematemesis, unspecified whether nausea present: acute illness or injury that poses a threat to life or bodily functions Syncope, unspecified syncope type: acute illness or injury that poses a threat to life or bodily  functions Thrombocytopenia (HCC): chronic illness or injury  Amount and/or Complexity of Data Reviewed Labs: ordered. Decision-making details documented in ED Course. Radiology: ordered.  Risk Prescription drug management.   Patient presented to the ED for evaluation of a syncopal episode.  EMS reported the patient was working outside in the heat.  He did drink some alcohol today.  Question if the patient had a nosebleed however other than the blood around his nares he does not have any significant swelling or trauma.  Patient told me that he did have an episode of hematemesis prior to the syncopal event.  Patient does have new anemia and he does have a history of esophageal varices.  I am concerned about the possibility of esophageal bleed causing hematemesis and his syncopal episode.  Patient has been started on blood transfusion.  Have also ordered platelets.  He is also been started on octreotide as well as Protonix.  I have consulted with [facility].  Patient's blood pressure has improved.  No longer hypotensive but we will need to monitor closely.  I will consult with the medical service for admission.        Final Clinical Impression(s) / ED Diagnoses Final diagnoses:  Hematemesis, unspecified whether nausea present  Syncope, unspecified syncope type  Anemia, unspecified type  Thrombocytopenia [facility])    Rx / DC Orders ED Discharge Orders     None         Linwood Dibbles, MD 05/08/22 1429

## 2022-05-08 NOTE — Assessment & Plan Note (Addendum)
Repeat hemoglobin A1c.  Placed on sensitive sliding scale insulin q4hr.

## 2022-05-09 ENCOUNTER — Inpatient Hospital Stay (HOSPITAL_COMMUNITY): Payer: Self-pay | Admitting: Anesthesiology

## 2022-05-09 ENCOUNTER — Encounter (HOSPITAL_COMMUNITY)
Admission: EM | Disposition: A | Discharge status: Home / Self Care | Payer: Self-pay | Source: Home / Self Care | Attending: Internal Medicine

## 2022-05-09 DIAGNOSIS — K7460 Unspecified cirrhosis of liver: Secondary | ICD-10-CM

## 2022-05-09 DIAGNOSIS — I8510 Secondary esophageal varices without bleeding: Secondary | ICD-10-CM

## 2022-05-09 DIAGNOSIS — I10 Essential (primary) hypertension: Secondary | ICD-10-CM

## 2022-05-09 DIAGNOSIS — K920 Hematemesis: Secondary | ICD-10-CM

## 2022-05-09 DIAGNOSIS — K2289 Other specified disease of esophagus: Secondary | ICD-10-CM

## 2022-05-09 HISTORY — PX: ESOPHAGEAL BANDING: SHX5518

## 2022-05-09 HISTORY — PX: ESOPHAGOGASTRODUODENOSCOPY (EGD) WITH PROPOFOL: SHX5813

## 2022-05-09 LAB — HEMOGLOBIN AND HEMATOCRIT, BLOOD
HCT: 22.8 % — ABNORMAL LOW (ref 39.0–52.0)
HCT: 20.2 % — ABNORMAL LOW (ref 39.0–52.0)
HCT: 19.2 % — ABNORMAL LOW (ref 39.0–52.0)
HCT: 21.1 % — ABNORMAL LOW (ref 39.0–52.0)
HCT: 35.1 % — ABNORMAL LOW (ref 39.0–52.0)
Hemoglobin: 7.1 g/dL — ABNORMAL LOW (ref 13.0–17.0)
Hemoglobin: 6.5 g/dL — CL (ref 13.0–17.0)
Hemoglobin: 6.1 g/dL — CL (ref 13.0–17.0)
Hemoglobin: 7.1 g/dL — ABNORMAL LOW (ref 13.0–17.0)
Hemoglobin: 11.9 g/dL — ABNORMAL LOW (ref 13.0–17.0)

## 2022-05-09 LAB — GLUCOSE, CAPILLARY
Glucose-Capillary: 207 mg/dL — ABNORMAL HIGH (ref 70–99)
Glucose-Capillary: 209 mg/dL — ABNORMAL HIGH (ref 70–99)
Glucose-Capillary: 168 mg/dL — ABNORMAL HIGH (ref 70–99)
Glucose-Capillary: 201 mg/dL — ABNORMAL HIGH (ref 70–99)
Glucose-Capillary: 232 mg/dL — ABNORMAL HIGH (ref 70–99)
Glucose-Capillary: 229 mg/dL — ABNORMAL HIGH (ref 70–99)

## 2022-05-09 LAB — HEMOGLOBIN A1C
Hgb A1c MFr Bld: 7.1 % — ABNORMAL HIGH (ref 4.8–5.6)
Mean Plasma Glucose: 157.07 mg/dL

## 2022-05-09 LAB — COMPREHENSIVE METABOLIC PANEL
ALT: 684 U/L — ABNORMAL HIGH (ref 0–44)
AST: 1113 U/L — ABNORMAL HIGH (ref 15–41)
Albumin: 2.2 g/dL — ABNORMAL LOW (ref 3.5–5.0)
Alkaline Phosphatase: 65 U/L (ref 38–126)
Anion gap: 5 (ref 5–15)
BUN: 24 mg/dL — ABNORMAL HIGH (ref 6–20)
CO2: 18 mmol/L — ABNORMAL LOW (ref 22–32)
Calcium: 6.9 mg/dL — ABNORMAL LOW (ref 8.9–10.3)
Chloride: 115 mmol/L — ABNORMAL HIGH (ref 98–111)
Creatinine, Ser: 0.87 mg/dL (ref 0.61–1.24)
GFR, Estimated: >60 mL/min (ref >60)
Glucose, Bld: 247 mg/dL — ABNORMAL HIGH (ref 70–99)
Potassium: 4.4 mmol/L (ref 3.5–5.1)
Sodium: 138 mmol/L (ref 135–145)
Total Bilirubin: 4.3 mg/dL — ABNORMAL HIGH (ref 0.3–1.2)
Total Protein: 5 g/dL — ABNORMAL LOW (ref 6.5–8.1)

## 2022-05-09 LAB — PREPARE PLATELET PHERESIS: Unit division: 0

## 2022-05-09 LAB — PREPARE RBC (CROSSMATCH)

## 2022-05-09 LAB — BPAM PLATELET PHERESIS
Blood Product Expiration Date: 202309022359
ISSUE DATE / TIME: 202309021845
Unit Type and Rh: 6200

## 2022-05-09 SURGERY — ESOPHAGOGASTRODUODENOSCOPY (EGD) WITH PROPOFOL
Anesthesia: Monitor Anesthesia Care

## 2022-05-09 MED ORDER — LIDOCAINE 2% (20 MG/ML) 5 ML SYRINGE
INTRAMUSCULAR | Status: DC | PRN
  Administered 2022-05-09: 60 mg via INTRAVENOUS

## 2022-05-09 MED ORDER — DEXMEDETOMIDINE (PRECEDEX) IN NS 20 MCG/5ML (4 MCG/ML) IV SYRINGE
PREFILLED_SYRINGE | INTRAVENOUS | Status: DC | PRN
  Administered 2022-05-09: 5 ug via INTRAVENOUS

## 2022-05-09 MED ORDER — SUGAMMADEX SODIUM 200 MG/2ML IV SOLN
INTRAVENOUS | Status: DC | PRN
  Administered 2022-05-09: 200 mg via INTRAVENOUS

## 2022-05-09 MED ORDER — SODIUM CHLORIDE 0.9% IV SOLUTION: Freq: Once | INTRAVENOUS | Status: AC

## 2022-05-09 MED ORDER — PROPOFOL 500 MG/50ML IV EMUL
INTRAVENOUS | Status: DC | PRN
  Administered 2022-05-09: 125 ug/kg/min via INTRAVENOUS

## 2022-05-09 MED ORDER — ROCURONIUM BROMIDE 10 MG/ML (PF) SYRINGE
PREFILLED_SYRINGE | INTRAVENOUS | Status: DC | PRN
  Administered 2022-05-09: 35 mg via INTRAVENOUS

## 2022-05-09 MED ORDER — ONDANSETRON HCL 4 MG/2ML IJ SOLN
INTRAMUSCULAR | Status: DC | PRN
  Administered 2022-05-09: 4 mg via INTRAVENOUS

## 2022-05-09 MED ORDER — FENTANYL CITRATE (PF) 100 MCG/2ML IJ SOLN
INTRAMUSCULAR | Status: DC | PRN
  Administered 2022-05-09 (×2): 50 ug via INTRAVENOUS

## 2022-05-09 MED ORDER — PHENYLEPHRINE 80 MCG/ML (10ML) SYRINGE FOR IV PUSH (FOR BLOOD PRESSURE SUPPORT)
PREFILLED_SYRINGE | INTRAVENOUS | Status: DC | PRN
  Administered 2022-05-09 (×3): 80 ug via INTRAVENOUS
  Administered 2022-05-09: 160 ug via INTRAVENOUS

## 2022-05-09 MED ORDER — PROPOFOL 10 MG/ML IV BOLUS
INTRAVENOUS | Status: DC | PRN
  Administered 2022-05-09: 20 mg via INTRAVENOUS
  Administered 2022-05-09: 40 mg via INTRAVENOUS

## 2022-05-09 MED ORDER — ALBUMIN HUMAN 5 % IV SOLN: INTRAVENOUS | Status: DC | PRN

## 2022-05-09 MED ORDER — PHENYLEPHRINE HCL-NACL 20-0.9 MG/250ML-% IV SOLN
INTRAVENOUS | Status: DC | PRN
  Administered 2022-05-09: 50 ug/min via INTRAVENOUS

## 2022-05-09 MED ORDER — SUCCINYLCHOLINE CHLORIDE 200 MG/10ML IV SOSY
PREFILLED_SYRINGE | INTRAVENOUS | Status: DC | PRN
  Administered 2022-05-09: 120 mg via INTRAVENOUS

## 2022-05-09 SURGICAL SUPPLY — 15 items

## 2022-05-09 NOTE — Progress Notes (Signed)
Pt R Wr IV found to be infiltrated

## 2022-05-09 NOTE — Plan of Care (Signed)

## 2022-05-09 NOTE — Progress Notes (Signed)
Pt noted to have bright red blood with clots after bowel movement. MD made aware

## 2022-05-09 NOTE — Progress Notes (Signed)
Per MD, okay to pause protonix drip to transfuse blood while waiting on new IV site.

## 2022-05-09 NOTE — Transfer of Care (Signed)
Immediate Anesthesia Transfer of Care Note  Patient: Wesley Williams  Procedure(s) Performed: ESOPHAGOGASTRODUODENOSCOPY (EGD) WITH PROPOFOL ESOPHAGEAL BANDING  Patient Location: PACU  Anesthesia Type:General  Level of Consciousness: awake, alert  and oriented  Airway & Oxygen Therapy: Patient Spontanous Breathing and Patient connected to nasal cannula oxygen  Post-op Assessment: Report given to RN, Post -op Vital signs reviewed and stable and Patient moving all extremities X 4  Post vital signs: Reviewed and stable  Last Vitals:  Vitals Value Taken Time  BP 104/62 05/09/22 1145  Temp 36.2 C 05/09/22 1130  Pulse 95 05/09/22 1156  Resp 17 05/09/22 1156  SpO2 97 % 05/09/22 1156  Vitals shown include unvalidated device data.  Last Pain:  Vitals:   05/09/22 1130  TempSrc:   PainSc: 0-No pain         Complications: No notable events documented.

## 2022-05-09 NOTE — Progress Notes (Signed)
Pt off the floor to EGD

## 2022-05-09 NOTE — Anesthesia Preprocedure Evaluation (Signed)
Anesthesia Evaluation  Patient identified by MRN, date of birth, ID band Patient awake    Reviewed: Allergy & Precautions, NPO status , Patient's Chart, lab work & pertinent test results  Airway Mallampati: I  TM Distance: >3 FB Neck ROM: Full    Dental  (+) Poor Dentition, Missing, Dental Advisory Given,  Multiple missing teeth. One upper molar is loose:   Pulmonary neg pulmonary ROS,    Pulmonary exam normal breath sounds clear to auscultation       Cardiovascular hypertension, Normal cardiovascular exam Rhythm:Regular Rate:Normal  TTE 2017 - Left ventricle: The cavity size was normal. Wall thickness wasincreased in a pattern of mild LVH. Systolic function was normal. The estimated ejection fraction was in the range of 60% to 65%. Wall motion was normal; there were no regional wall motionabnormalities. Left ventricular diastolic function parameterswere normal.  - Aortic valve: Moderately calcified annulus. Trileaflet;  moderately thickened leaflets. Valve area (VTI): 2.39 cm^2. Valve area (Vmax): 2.26 cm^2. Valve area (Vmean): 2.4 cm^2.  - Mitral valve: Mildly calcified annulus. Mildly thickened leaflets. There was mild regurgitation.  - Left atrium: The atrium was severely dilated.  - Atrial septum: No defect or patent foramen ovale was identified.  - Pulmonary arteries: Systolic pressure was moderately increased. PA peak pressure: 35 mm Hg (S).  - Technically adequate study   Neuro/Psych  Headaches, negative psych ROS   GI/Hepatic negative GI ROS, (+) Cirrhosis   Esophageal Varices  substance abuse  alcohol use,   Endo/Other  negative endocrine ROS  Renal/GU negative Renal ROS  negative genitourinary   Musculoskeletal negative musculoskeletal ROS (+)   Abdominal   Peds  Hematology  (+) Blood dyscrasia, anemia ,   Anesthesia Other Findings   Reproductive/Obstetrics                              Anesthesia Physical  Anesthesia Plan  ASA: 3  Anesthesia Plan: MAC   Post-op Pain Management:    Induction: Intravenous  PONV Risk Score and Plan: 1 and Propofol infusion and Treatment may vary due to age or medical condition  Airway Management Planned: Natural Airway, Nasal Cannula and Simple Face Mask  Additional Equipment:   Intra-op Plan:   Post-operative Plan:   Informed Consent: I have reviewed the patients History and Physical, chart, labs and discussed the procedure including the risks, benefits and alternatives for the proposed anesthesia with the patient or authorized representative who has indicated his/her understanding and acceptance.     Dental advisory given  Plan Discussed with: CRNA and Anesthesiologist  Anesthesia Plan Comments:         Anesthesia Quick Evaluation

## 2022-05-09 NOTE — Anesthesia Postprocedure Evaluation (Signed)
Anesthesia Post Note  Patient: AGASTYA MEISTER  Procedure(s) Performed: ESOPHAGOGASTRODUODENOSCOPY (EGD) WITH PROPOFOL ESOPHAGEAL BANDING     Patient location during evaluation: PACU Anesthesia Type: General Level of consciousness: awake and alert Pain management: pain level controlled Vital Signs Assessment: post-procedure vital signs reviewed and stable Respiratory status: spontaneous breathing, nonlabored ventilation, respiratory function stable and patient connected to nasal cannula oxygen Cardiovascular status: blood pressure returned to baseline and stable Postop Assessment: no apparent nausea or vomiting Anesthetic complications: no   No notable events documented.  Last Vitals:  Vitals:   05/09/22 1530 05/09/22 1738  BP: 111/75 113/72  Pulse: 93 98  Resp: 18 18  Temp: 37.1 C 36.5 C  SpO2: 99% 98%    Last Pain:  Vitals:   05/09/22 1738  TempSrc: Oral  PainSc:                  Kennieth Rad

## 2022-05-09 NOTE — Anesthesia Procedure Notes (Signed)
Procedure Name: MAC Date/Time: 05/09/2022 10:26 AM  Performed by: Mariea Clonts, CRNAPre-anesthesia Checklist: Emergency Drugs available, Suction available, Patient being monitored, Timeout performed and Patient identified Patient Re-evaluated:Patient Re-evaluated prior to induction Oxygen Delivery Method: Simple face mask and Nasal cannula

## 2022-05-09 NOTE — Progress Notes (Signed)
PROGRESS NOTE    Wesley Williams  [id number] DOB: December 22, 1970 DOA: 05/08/2022 PCP: Horald Pollen, MD  Outpatient Specialists:     Brief Narrative:  As per H&P done on admission: " Wesley Williams is a 51 y.o. male with medical history significant of alcoholic cirrhosis with esophageal varices s/p banding, type 2 diabetes, chronic thrombocytopenia who presents with concerns of syncope.   Wife provides hx at bedside with daughter translating. She saw him leaning against his truck today after doing yard work. He was very diaphoretic and then had hematemesis and nosebleed.  Had alcohol earlier this morning.  Continues to drink at least 2 tall Bud Light daily.  Does not take his beta-blocker or metformin.  Last episode of hematemesis was about 2 years ago with esophageal variceal bleed requiring banding and has not follow-up for cirrhosis since then.   On EMS arrival, BP noted to be 85/58 with improvement of blood pressure following 100 cc of bolus.  Remains normotensive in the ED.  Hemoglobin downward trended to 6.6 from a prior of 12.4 several months ago.  Thrombocytopenia platelet of 55 which is chronic. ALT and AST mildly elevated but at his baseline.  Elevated total bili of 2 also at his baseline.  FOBT is negative without any melanotic stool.   Patient was ordered 2 units of PRBC and 1 units of platelet.  He was started on IV Protonix infusion and IV octreotide.  He later had 2 episodes of hematemesis while in the ED.  [facility] was consulted.  Hospitalist was then consulted for admission".  05/09/2022: Patient seen alongside patient's partner and partner's daughter.  Partner's daughter assisted with interpretation.  Patient is a known alcoholic with variceal bleed.  Hemoglobin dropped from 12.1 g/dL on 12/28/2021 to 6.6 g/dL on presentation.  Patient has been transfused with 1 unit of packed red blood cells.  Last hemoglobin was 7.1 g/dL.  GI team is directing patient's care.  Patient  underwent EGD.  EGD revealed: "- The examined portions of the nasopharynx, oropharynx and larynx were normal. - Grade III esophageal varices. Incompletely eradicated. Banded. - Red blood in the lower third of the esophagus. - No specimens collected".  GI team has recommended: - NPO today, ice chips ok. - Continue present medications (octreotide drip, PPI drip, ceftriaxone). - Continue to trend CBC q6-8 hours - Goal Hgb 7, tolerate borderline MAPs given portal hypertensive physiology - TIPS procedure if patient rebleeds   Assessment & Plan:   Principal Problem:   Esophageal varices with bleeding (HCC) Active Problems:   Alcoholic cirrhosis of liver without ascites (HCC)   Type 2 diabetes mellitus with hyperglycemia, without long-term current use of insulin (HCC)   Symptomatic anemia   Hypotension   Hematemesis   * Esophageal varices with bleeding (HCC) Secondary to alcoholic cirrhosis with portal hypertension and ongoing alcohol use -Last EGD with [facility] noted on 05/19/2020 with grade 2 and large esophageal varices with 5 bands placed.  Mild gastritis. -Continue IV Protonix and octreotide infusion -Hemoglobin downward trended to 6.6 from 12.4.  Pt now s/p 3u pRBC and 1u Plt and continues to be hypotensive to SBP 70 with hemametesis. Will give 2 more units of pRBC with 2 units ahead.  -Will transfuse for Hgb of less than 9 due to ongoing hematemesis and risk of brisk bleeding with esophogeal varices. Follow H/H q2hrs  -start IV Rocephin for SBP prophylaxis  -updated GI on ongoing hematemesis. They will see in consultation.  05/09/2022: Patient has undergone EGD.  GI input is appreciated.  Please see above documentation.   Hypotension Secondary to symptomatic anemia from likely esophageal variceal bleed.  Continue management as above. -goal of SBP >90 and MAP of 65 05/09/2022: Hypotension has resolved significantly.  Last documented blood pressure was 111/75 mmHg.   Type 2  diabetes mellitus with hyperglycemia, without long-term current use of insulin (HCC) Continue to optimize.   On sensitive sliding scale insulin protocol   Alcoholic cirrhosis of liver without ascites ([place]) Reports ongoing alcohol use of up to five 25oz beer daily. Last drink this morning. -On CIWA protocol   DVT prophylaxis: SCD Code Status: Full code Family Communication: Partner. Disposition Plan: This will depend on hospital course.  Prognosis is guarded   Consultants:  GI  Procedures:  EGD  Antimicrobials:  IV ceftriaxone   Subjective: -No further hematemesis.  Objective: Vitals:   05/09/22 1145 05/09/22 1200 05/09/22 1500 05/09/22 1530  BP: 104/62 108/65 112/69 111/75  Pulse: 96 98 94 93  Resp: 11 16 18 18   Temp:  (!) 97.1 F (36.2 C) 98.8 F (37.1 C) 98.8 F (37.1 C)  TempSrc:   Oral Oral  SpO2: 98% 95%  99%  Weight:      Height:        Intake/Output Summary (Last 24 hours) at 05/09/2022 1634 Last data filed at 05/09/2022 1113 Gross per 24 hour  Intake 3934.71 ml  Output 600 ml  Net 3334.71 ml   Filed Weights   05/08/22 2247  Weight: 90.9 kg    Examination:  General exam: Appears calm and comfortable.  Patient is jaundice.  Patient is pale. Respiratory system: Clear to auscultation.  Cardiovascular system: S1 & S2 heard. Gastrointestinal system: Abdomen is obese, soft and nontender. No organomegaly or masses felt. Normal bowel sounds heard. Central nervous system: Alert and oriented. No focal neurological deficits. Extremities: No leg edema.  Data Reviewed: I have personally reviewed following labs and imaging studies  CBC: Recent Labs  Lab 05/08/22 1243 05/08/22 2226 05/09/22 0055 05/09/22 1246 05/09/22 1508  WBC 4.8  --   --   --   --   NEUTROABS 3.9  --   --   --   --   HGB 6.6* 7.2* 7.1* 6.5* 6.1*  HCT 22.4* 22.8* 22.8* 20.2* 19.2*  MCV 83.3  --   --   --   --   PLT 55*  --   --   --   --    Basic Metabolic Panel: Recent Labs   Lab 05/08/22 1243  NA 138  K 3.4*  CL 110  CO2 15*  GLUCOSE 268*  BUN 8  CREATININE 0.76  CALCIUM 6.8*   GFR: Estimated Creatinine Clearance: 112 mL/min (by C-G formula based on SCr of 0.76 mg/dL). Liver Function Tests: Recent Labs  Lab 05/08/22 1243  AST 52*  ALT 28  ALKPHOS 71  BILITOT 2.0*  PROT 5.1*  ALBUMIN 2.0*   No results for input(s): "LIPASE", "AMYLASE" in the last 168 hours. No results for input(s): "AMMONIA" in the last 168 hours. Coagulation Profile: Recent Labs  Lab 05/08/22 1243  INR 1.9*   Cardiac Enzymes: No results for input(s): "CKTOTAL", "CKMB", "CKMBINDEX", "TROPONINI" in the last 168 hours. BNP (last 3 results) No results for input(s): "PROBNP" in the last 8760 hours. HbA1C: Recent Labs    05/08/22 2226  HGBA1C 7.1*   CBG: Recent Labs  Lab 05/08/22 2132 05/09/22 0104 05/09/22 [id number]  05/09/22 1017 05/09/22 1254  GLUCAP 233* 207* 209* 168* 201*   Lipid Profile: No results for input(s): "CHOL", "HDL", "LDLCALC", "TRIG", "CHOLHDL", "LDLDIRECT" in the last 72 hours. Thyroid Function Tests: No results for input(s): "TSH", "T4TOTAL", "FREET4", "T3FREE", "THYROIDAB" in the last 72 hours. Anemia Panel: No results for input(s): "VITAMINB12", "FOLATE", "FERRITIN", "TIBC", "IRON", "RETICCTPCT" in the last 72 hours. Urine analysis:    Component Value Date/Time   COLORURINE AMBER (A) 02/24/2021 1050   APPEARANCEUR CLEAR 02/24/2021 1050   LABSPEC 1.029 02/24/2021 1050   PHURINE 6.0 02/24/2021 1050   GLUCOSEU >=500 (A) 02/24/2021 1050   HGBUR MODERATE (A) 02/24/2021 1050   BILIRUBINUR NEGATIVE 02/24/2021 1050   BILIRUBINUR small (A) 11/26/2017 1504   KETONESUR 20 (A) 02/24/2021 1050   PROTEINUR NEGATIVE 02/24/2021 1050   UROBILINOGEN >=8.0 (A) 11/26/2017 1504   NITRITE NEGATIVE 02/24/2021 1050   LEUKOCYTESUR NEGATIVE 02/24/2021 1050   Sepsis Labs: @LABRCNTIP (procalcitonin:4,lacticidven:4)  )No results found for this or any previous  visit (from the past 240 hour(s)).       Radiology Studies: DG Chest 1 View  Result Date: 05/08/2022 CLINICAL DATA:  Loss of consciousness. EXAM: CHEST  1 VIEW COMPARISON:  February 24, 2021 FINDINGS: Study is limited due to low volume portable technique. The cardiomediastinal silhouette is unremarkable given technique. No pneumothorax. No nodules or masses. No focal infiltrates. No overt edema. IMPRESSION: The study is limited due to low volume portable technique. No acute abnormalities are identified. Electronically Signed   By: February 26, 2021 III M.D.   On: 05/08/2022 13:40   CT Cervical Spine Wo Contrast  Result Date: 05/08/2022 CLINICAL DATA:  Trauma, syncope EXAM: CT CERVICAL SPINE WITHOUT CONTRAST TECHNIQUE: Multidetector CT imaging of the cervical spine was performed without intravenous contrast. Multiplanar CT image reconstructions were also generated. RADIATION DOSE REDUCTION: This exam was performed according to the departmental dose-optimization program which includes automated exposure control, adjustment of the mA and/or kV according to patient size and/or use of iterative reconstruction technique. COMPARISON:  None Available. FINDINGS: Alignment: Alignment of posterior margins of the vertebral bodies is unremarkable. Skull base and vertebrae: No recent fracture is seen. A 7 mm low-density in the right side of body C2 vertebra without break in the cortical margins, possibly a benign process such as bone cyst. Soft tissues and spinal canal: There is no central spinal stenosis. Disc levels: There is no significant encroachment of neural foramina. Small anterior bony spurs are seen at C5-C6 and C6-C7 levels. Upper chest: Unremarkable. Other: Dense calcifications are seen at both common carotid bifurcations. IMPRESSION: No recent fracture is seen in cervical spine. There is no spinal stenosis. There is no significant encroachment of neural foramina. The 7 mm low-density in the right side of body of  C2 vertebra without break in the cortical margins suggesting possible benign process such as bone cyst. Extensive calcifications are noted at both common carotid bifurcations. Electronically Signed   By: 07/08/2022 M.D.   On: 05/08/2022 13:21   CT Head Wo Contrast  Result Date: 05/08/2022 CLINICAL DATA:  Trauma, syncope EXAM: CT HEAD WITHOUT CONTRAST TECHNIQUE: Contiguous axial images were obtained from the base of the skull through the vertex without intravenous contrast. RADIATION DOSE REDUCTION: This exam was performed according to the departmental dose-optimization program which includes automated exposure control, adjustment of the mA and/or kV according to patient size and/or use of iterative reconstruction technique. COMPARISON:  None Available. FINDINGS: Brain: No acute intracranial findings are seen. There are  no signs of bleeding within the cranium. Ventricles are nondilated. Cortical sulci are prominent. In image 14 of series 3, there is 7 mm dense calcification in the central portion of left temporal lobe. There is no significant adjacent edema or focal mass effect. Vascular: Scattered arterial calcifications are seen. Skull: No fracture is seen. Sinuses/Orbits: There is mild mucosal thickening in ethmoid and maxillary sinuses. Defects are seen in the crowns of some other visualized maxillary and mandibular teeth suggesting significant dental disease. Other: None. IMPRESSION: No acute intracranial findings are seen.  Atrophy. There is 7 mm dense calcification in the central portion of left temporal lobe without significant adjacent edema or mass effect. This may suggest healed granuloma. If there are continued symptoms, follow-up MRI should be considered. Mild chronic sinusitis.  Significant dental disease. Electronically Signed   By: Ernie Avena M.D.   On: 05/08/2022 13:15        Scheduled Meds:  sodium chloride   Intravenous Once   insulin aspart  0-9 Units Subcutaneous Q4H    thiamine  100 mg Oral Daily   Or   thiamine  100 mg Intravenous Daily   Continuous Infusions:  sodium chloride 125 mL/hr at 05/09/22 1024   sodium chloride Stopped (05/08/22 1415)   cefTRIAXone (ROCEPHIN)  IV 1 g (05/08/22 2137)   octreotide (SANDOSTATIN) 500 mcg in sodium chloride 0.9 % 250 mL (2 mcg/mL) infusion 50 mcg/hr (05/09/22 1129)   pantoprazole 8 mg/hr (05/08/22 2341)     LOS: 1 day    Time spent: 55 minutes    Berton Mount, MD  [facility] Pager #: [phone] 7PM-7AM contact night coverage as above

## 2022-05-09 NOTE — Anesthesia Procedure Notes (Signed)
Procedure Name: Intubation Date/Time: 05/09/2022 10:39 AM  Performed by: Marena Chancy, CRNAPre-anesthesia Checklist: Patient identified, Emergency Drugs available, Suction available and Patient being monitored Patient Re-evaluated:Patient Re-evaluated prior to induction Oxygen Delivery Method: Circle System Utilized Preoxygenation: Pre-oxygenation with 100% oxygen Induction Type: IV induction Ventilation: Mask ventilation without difficulty Laryngoscope Size: Miller and 2 Grade View: Grade I Tube type: Oral Tube size: 7.5 mm Number of attempts: 1 Airway Equipment and Method: Stylet and Oral airway Placement Confirmation: ETT inserted through vocal cords under direct vision, positive ETCO2 and breath sounds checked- equal and bilateral Tube secured with: Tape Dental Injury: Teeth and Oropharynx as per pre-operative assessment

## 2022-05-09 NOTE — Interval H&P Note (Signed)
History and Physical Interval Note:  05/09/2022 10:25 AM  Wesley Williams  has presented today for surgery, with the diagnosis of Hematemesis.  The various methods of treatment have been discussed with the patient and family. After consideration of risks, benefits and other options for treatment, the patient has consented to  Procedure(s): ESOPHAGOGASTRODUODENOSCOPY (EGD) WITH PROPOFOL (N/A) as a surgical intervention.  The patient's history has been reviewed, patient examined, no change in status, stable for surgery.  I have reviewed the patient's chart and labs.  Questions were answered to the patient's satisfaction.    Patient received 2 units pRBCs and 1 unit platelet last night.  He is currently being transfused a third unit.  His blood pressure has normalized and he has not vomited since yesterday evening.  He feels much better.  His hgb was 7 this morning.  Plan for EGD with variceal banding today   Wesley Williams

## 2022-05-09 NOTE — Op Note (Signed)
[facility] Patient Name: Wesley Williams Procedure Date : 05/09/2022 MRN: [id number] Attending MD: Gladstone Pih. Candis Schatz , MD Date of Birth: 1971-01-29 CSN: [id number] Age: 51 Admit Type: Inpatient Procedure:                Upper GI endoscopy Indications:              Hematemesis Providers:                Nicki Reaper E. Candis Schatz, MD, Grace Isaac, RN, Benetta Spar, Technician Referring MD:              Medicines:                MAC converted to general anesthesia Complications:            No immediate complications. Estimated Blood Loss:     Estimated blood loss was minimal. Procedure:                Pre-Anesthesia Assessment:                           - Prior to the procedure, a History and Physical                            was performed, and patient medications and                            allergies were reviewed. The patient's tolerance of                            previous anesthesia was also reviewed. The risks                            and benefits of the procedure and the sedation                            options and risks were discussed with the patient.                            All questions were answered, and informed consent                            was obtained. Prior Anticoagulants: The patient has                            taken no previous anticoagulant or antiplatelet                            agents. ASA Grade Assessment: III - A patient with                            severe systemic disease. After reviewing the risks  and benefits, the patient was deemed in                            satisfactory condition to undergo the procedure.                           After obtaining informed consent, the endoscope was                            passed under direct vision. Throughout the                            procedure, the patient's blood pressure, pulse, and                            oxygen  saturations were monitored continuously. The                            [id number]-[id number] ([id number] Olympus endoscope was introduced                            through the mouth, and advanced to the esophagus                            down to the cardia. The upper GI endoscopy was                            accomplished without difficulty. The patient                            tolerated the procedure well. Scope In: Scope Out: Findings:      The examined portions of the nasopharynx, oropharynx and larynx were       normal. Bright red blood was noted in the pharynx which prompted the       decision to intubate the patient. The scope was withdrawn and the       patient was intubated.      Grade III varices were found in the middle third of the esophagus and in       the lower third of the esophagus. They were large in size. Profuse       hemorrhage was noted, and a spurting lesion was identified. A band was       placed in the area of the spurting lesion, and the bleeding decreased,       but did not stop. Fresh blood continued to be present but without an       obvious target. Three additional bands were placed on suspicious varices       and following the fourth band, the bleeding subsided.      Red blood was found in the lower third of the esophagus. Impression:               - The examined portions of the nasopharynx,                            oropharynx and larynx were normal.                           -  Grade III esophageal varices. Incompletely                            eradicated. Banded.                           - Red blood in the lower third of the esophagus.                           - No specimens collected. Moderate Sedation:      n/a Recommendation:           - Return patient to hospital ward for ongoing care.                           - NPO today, ice chips ok.                           - Continue present medications (octreotide drip,                            PPI drip,  ceftriaxone).                           - Continue to trend CBC q6-8 hours                           - Goal Hgb 7, tolerate borderline MAPs given portal                            hypertensive physiology                           - Would recommend TIPS procedure if patient rebleeds Procedure Code(s):        --- Professional ---                           [phone], Esophagoscopy, flexible, transoral; with                            band ligation of esophageal varices Diagnosis Code(s):        --- Professional ---                           I85.00, Esophageal varices without bleeding                           K22.8, Other specified diseases of esophagus                           K92.0, Hematemesis CPT copyright 2019 [employer]. All rights reserved. The codes documented in this report are preliminary and upon coder review may  be revised to meet current compliance requirements. Carianne Taira E. Candis Schatz, MD 05/09/2022 11:34:15 AM This report has been signed electronically. Number of Addenda: 0

## 2022-05-09 NOTE — Progress Notes (Signed)
1 PRBC completed. Attempted to call lab for H/H. No answer.

## 2022-05-09 NOTE — Progress Notes (Addendum)
Attempted to place IV x2. Unsuccessful. IV team ordered.

## 2022-05-10 ENCOUNTER — Inpatient Hospital Stay (HOSPITAL_COMMUNITY): Payer: Self-pay

## 2022-05-10 ENCOUNTER — Encounter (HOSPITAL_COMMUNITY): Payer: Self-pay | Admitting: Gastroenterology

## 2022-05-10 DIAGNOSIS — R7401 Elevation of levels of liver transaminase levels: Secondary | ICD-10-CM

## 2022-05-10 LAB — HEMOGLOBIN AND HEMATOCRIT, BLOOD
HCT: 22.3 % — ABNORMAL LOW (ref 39.0–52.0)
HCT: 21.2 % — ABNORMAL LOW (ref 39.0–52.0)
HCT: 24.1 % — ABNORMAL LOW (ref 39.0–52.0)
HCT: 23 % — ABNORMAL LOW (ref 39.0–52.0)
HCT: 22.5 % — ABNORMAL LOW (ref 39.0–52.0)
HCT: 21.7 % — ABNORMAL LOW (ref 39.0–52.0)
HCT: 22.1 % — ABNORMAL LOW (ref 39.0–52.0)
HCT: 23.9 % — ABNORMAL LOW (ref 39.0–52.0)
HCT: 20.9 % — ABNORMAL LOW (ref 39.0–52.0)
Hemoglobin: 7 g/dL — ABNORMAL LOW (ref 13.0–17.0)
Hemoglobin: 6.9 g/dL — CL (ref 13.0–17.0)
Hemoglobin: 7.8 g/dL — ABNORMAL LOW (ref 13.0–17.0)
Hemoglobin: 7.7 g/dL — ABNORMAL LOW (ref 13.0–17.0)
Hemoglobin: 7.3 g/dL — ABNORMAL LOW (ref 13.0–17.0)
Hemoglobin: 7.1 g/dL — ABNORMAL LOW (ref 13.0–17.0)
Hemoglobin: 7.4 g/dL — ABNORMAL LOW (ref 13.0–17.0)
Hemoglobin: 7.6 g/dL — ABNORMAL LOW (ref 13.0–17.0)
Hemoglobin: 7.2 g/dL — ABNORMAL LOW (ref 13.0–17.0)

## 2022-05-10 LAB — GLUCOSE, CAPILLARY
Glucose-Capillary: 136 mg/dL — ABNORMAL HIGH (ref 70–99)
Glucose-Capillary: 148 mg/dL — ABNORMAL HIGH (ref 70–99)
Glucose-Capillary: 156 mg/dL — ABNORMAL HIGH (ref 70–99)
Glucose-Capillary: 201 mg/dL — ABNORMAL HIGH (ref 70–99)
Glucose-Capillary: 210 mg/dL — ABNORMAL HIGH (ref 70–99)
Glucose-Capillary: 225 mg/dL — ABNORMAL HIGH (ref 70–99)

## 2022-05-10 LAB — BASIC METABOLIC PANEL
Anion gap: 5 (ref 5–15)
BUN: 26 mg/dL — ABNORMAL HIGH (ref 6–20)
CO2: 18 mmol/L — ABNORMAL LOW (ref 22–32)
Calcium: 7.2 mg/dL — ABNORMAL LOW (ref 8.9–10.3)
Chloride: 116 mmol/L — ABNORMAL HIGH (ref 98–111)
Creatinine, Ser: 0.92 mg/dL (ref 0.61–1.24)
GFR, Estimated: >60 mL/min (ref >60)
Glucose, Bld: 152 mg/dL — ABNORMAL HIGH (ref 70–99)
Potassium: 3.8 mmol/L (ref 3.5–5.1)
Sodium: 139 mmol/L (ref 135–145)

## 2022-05-10 LAB — HEPATIC FUNCTION PANEL
ALT: 556 U/L — ABNORMAL HIGH (ref 0–44)
AST: 538 U/L — ABNORMAL HIGH (ref 15–41)
Albumin: 2.3 g/dL — ABNORMAL LOW (ref 3.5–5.0)
Alkaline Phosphatase: 69 U/L (ref 38–126)
Bilirubin, Direct: 2.7 mg/dL — ABNORMAL HIGH (ref 0.0–0.2)
Indirect Bilirubin: 2 mg/dL — ABNORMAL HIGH (ref 0.3–0.9)
Total Bilirubin: 4.7 mg/dL — ABNORMAL HIGH (ref 0.3–1.2)
Total Protein: 5.2 g/dL — ABNORMAL LOW (ref 6.5–8.1)

## 2022-05-10 LAB — PREPARE RBC (CROSSMATCH)

## 2022-05-10 MED ORDER — SODIUM CHLORIDE 0.9% IV SOLUTION: Freq: Once | INTRAVENOUS | Status: AC

## 2022-05-10 NOTE — Progress Notes (Signed)
[facility] ROUNDING NOTE   Subjective: Patient reports feeling, but states that he passed numerous red bowel movements yesterday and last night.  Last BM per patient was around 5 am and was bright red, although small in volume.   His blood pressure has remained stable, but his hgb continues to be low.  Transfused another unit this morning after hgb 6.9 (stable from 7 last night)  Liver enzymes markedly elevated last night with AST 1100, up from 52 the day before.  Patient denies abdominal pain, nausea/vomiting.   He is not hungry, but does desire liquids.   Objective: Vital signs in last 24 hours: Temp:  [97.1 F (36.2 C)-98.8 F (37.1 C)] 98.3 F (36.8 C) (09/04 0810) Pulse Rate:  [79-110] 79 (09/04 0810) Resp:  [11-24] 18 (09/04 0324) BP: (99-127)/(58-82) 122/82 (09/04 0810) SpO2:  [95 %-100 %] 99 % (09/04 0810) Last BM Date : 05/10/22 General: NAD Lungs:  CTA b/l, no w/r/r Heart:  RRR, no m/r/g Abdomen:  Soft, NT, ND, +BS Ext:  No c/c/e    Intake/Output from previous day: 09/03 0701 - 09/04 0700 In: 2934.6 [I.V.:1879.9; Blood:707; IV Piggyback:347.7] Out: -  Intake/Output this shift: No intake/output data recorded.   Lab Results: Recent Labs    05/08/22 1243 05/08/22 2226 05/10/22 0039 05/10/22 0251 05/10/22 0931  WBC 4.8  --   --   --   --   HGB 6.6*   < > 7.0* 6.9* 7.8*  PLT 55*  --   --   --   --   MCV 83.3  --   --   --   --    < > = values in this interval not displayed.   BMET Recent Labs    05/08/22 1243 05/09/22 1856  NA 138 138  K 3.4* 4.4  CL 110 115*  CO2 15* 18*  GLUCOSE 268* 247*  BUN 8 24*  CREATININE 0.76 0.87  CALCIUM 6.8* 6.9*   LFT Recent Labs    05/08/22 1243 05/09/22 1856  PROT 5.1* 5.0*  ALBUMIN 2.0* 2.2*  AST 52* 1,113*  ALT 28 684*  ALKPHOS 71 65  BILITOT 2.0* 4.3*   PT/INR Recent Labs    05/08/22 1243  INR 1.9*      Imaging/Other results: DG Chest 1 View  Result Date: 05/08/2022 CLINICAL  DATA:  Loss of consciousness. EXAM: CHEST  1 VIEW COMPARISON:  February 24, 2021 FINDINGS: Study is limited due to low volume portable technique. The cardiomediastinal silhouette is unremarkable given technique. No pneumothorax. No nodules or masses. No focal infiltrates. No overt edema. IMPRESSION: The study is limited due to low volume portable technique. No acute abnormalities are identified. Electronically Signed   By: Gerome Sam III M.D.   On: 05/08/2022 13:40   CT Cervical Spine Wo Contrast  Result Date: 05/08/2022 CLINICAL DATA:  Trauma, syncope EXAM: CT CERVICAL SPINE WITHOUT CONTRAST TECHNIQUE: Multidetector CT imaging of the cervical spine was performed without intravenous contrast. Multiplanar CT image reconstructions were also generated. RADIATION DOSE REDUCTION: This exam was performed according to the departmental dose-optimization program which includes automated exposure control, adjustment of the mA and/or kV according to patient size and/or use of iterative reconstruction technique. COMPARISON:  None Available. FINDINGS: Alignment: Alignment of posterior margins of the vertebral bodies is unremarkable. Skull base and vertebrae: No recent fracture is seen. A 7 mm low-density in the right side of body C2 vertebra without break in the cortical margins, possibly a benign  process such as bone cyst. Soft tissues and spinal canal: There is no central spinal stenosis. Disc levels: There is no significant encroachment of neural foramina. Small anterior bony spurs are seen at C5-C6 and C6-C7 levels. Upper chest: Unremarkable. Other: Dense calcifications are seen at both common carotid bifurcations. IMPRESSION: No recent fracture is seen in cervical spine. There is no spinal stenosis. There is no significant encroachment of neural foramina. The 7 mm low-density in the right side of body of C2 vertebra without break in the cortical margins suggesting possible benign process such as bone cyst. Extensive  calcifications are noted at both common carotid bifurcations. Electronically Signed   By: Ernie Avena M.D.   On: 05/08/2022 13:21   CT Head Wo Contrast  Result Date: 05/08/2022 CLINICAL DATA:  Trauma, syncope EXAM: CT HEAD WITHOUT CONTRAST TECHNIQUE: Contiguous axial images were obtained from the base of the skull through the vertex without intravenous contrast. RADIATION DOSE REDUCTION: This exam was performed according to the departmental dose-optimization program which includes automated exposure control, adjustment of the mA and/or kV according to patient size and/or use of iterative reconstruction technique. COMPARISON:  None Available. FINDINGS: Brain: No acute intracranial findings are seen. There are no signs of bleeding within the cranium. Ventricles are nondilated. Cortical sulci are prominent. In image 14 of series 3, there is 7 mm dense calcification in the central portion of left temporal lobe. There is no significant adjacent edema or focal mass effect. Vascular: Scattered arterial calcifications are seen. Skull: No fracture is seen. Sinuses/Orbits: There is mild mucosal thickening in ethmoid and maxillary sinuses. Defects are seen in the crowns of some other visualized maxillary and mandibular teeth suggesting significant dental disease. Other: None. IMPRESSION: No acute intracranial findings are seen.  Atrophy. There is 7 mm dense calcification in the central portion of left temporal lobe without significant adjacent edema or mass effect. This may suggest healed granuloma. If there are continued symptoms, follow-up MRI should be considered. Mild chronic sinusitis.  Significant dental disease. Electronically Signed   By: Ernie Avena M.D.   On: 05/08/2022 13:15      Assessment and Plan:  51 year old male with alcoholic cirrhosis, admitted with significant esophageal variceal bleed after presenting with syncope and hematemesis.  EGD 9/3 with profuse active hemorrhage from varix  in the mild esophagus.  Four bands were placed and hemostasis was achieved. He has passed multiple red bowel movements since his EGD which is not expected, however, I would expect this normalize very soon.   His hgb was essentially stable from midnight, and his blood pressures are good, which is reassuring against significant bleeding.  Given the concern for ongoing bleeding, recommend keeping the patient NPO except ice chips for now.  Will get BMP to reassess BUN  Regarding his marked rise in aminotransferases, I suspect this may be either related to medications from the anesthesia, or possibly related to hypotension/ischemic liver injury when the patient was hypotense the evening before.  If so, AST should start improving rapidly.  Will check hepatic function panel and get [place] to exclude thrombosis  Upper GI bleed from esophageal varices - Continue octreotide gtt - Continue Protonix gtt - Continue ceftriaxone today - Keep NPO except ice chips for now, potentially advance to liquids later today - BMP - TIPS if patient has significant rebleed  Elevated AST - Possibly related to anesthetics vs hypotension/shock liver - Recheck hepatic panel today - [place] to exclude PVT  Alcoholic cirrhosis -  CIWA protocol - [place] to exclude HCC, ascites - Patient was abstinent for 2 years before resuming drinking this summer.  Patient committed to stopping permanently.      [name], MD  05/10/2022, 10:31 AM  Gastroenterology

## 2022-05-10 NOTE — Plan of Care (Signed)

## 2022-05-10 NOTE — Progress Notes (Signed)
PROGRESS NOTE    [name]  DDU:[id number] DOB: 06/17/1971 DOA: 05/08/2022 PCP: Georgina Quint, MD  Outpatient Specialists:     Brief Narrative:  As per H&P done on admission: " Wesley Williams is a 51 y.o. male with medical history significant of alcoholic cirrhosis with esophageal varices s/p banding, type 2 diabetes, chronic thrombocytopenia who presents with concerns of syncope.   Wife provides hx at bedside with daughter translating. She saw him leaning against his truck today after doing yard work. He was very diaphoretic and then had hematemesis and nosebleed.  Had alcohol earlier this morning.  Continues to drink at least 2 tall Bud Light daily.  Does not take his beta-blocker or metformin.  Last episode of hematemesis was about 2 years ago with esophageal variceal bleed requiring banding and has not follow-up for cirrhosis since then.   On EMS arrival, BP noted to be 85/58 with improvement of blood pressure following 100 cc of bolus.  Remains normotensive in the ED.  Hemoglobin downward trended to 6.6 from a prior of 12.4 several months ago.  Thrombocytopenia platelet of 55 which is chronic. ALT and AST mildly elevated but at his baseline.  Elevated total bili of 2 also at his baseline.  FOBT is negative without any melanotic stool.   Patient was ordered 2 units of PRBC and 1 units of platelet.  He was started on IV Protonix infusion and IV octreotide.  He later had 2 episodes of hematemesis while in the ED.  [facility] was consulted.  Hospitalist was then consulted for admission".  05/09/2022: Patient seen alongside patient's partner and partner's daughter.  Partner's daughter assisted with interpretation.  Patient is a known alcoholic with variceal bleed.  Hemoglobin dropped from 12.1 g/dL on 2/37/6283 to 6.6 g/dL on presentation.  Patient has been transfused with 1 unit of packed red blood cells.  Last hemoglobin was 7.1 g/dL.  GI team is directing patient's care.  Patient  underwent EGD.  EGD revealed: "- The examined portions of the nasopharynx, oropharynx and larynx were normal. - Grade III esophageal varices. Incompletely eradicated. Banded. - Red blood in the lower third of the esophagus. - No specimens collected".  GI team has recommended: - NPO today, ice chips ok. - Continue present medications (octreotide drip, PPI drip, ceftriaxone). - Continue to trend CBC q6-8 hours - Goal Hgb 7, tolerate borderline MAPs given portal hypertensive physiology - TIPS procedure if patient rebleeds  05/10/2022: Intermittent rectal bleed reported.  Nursing staff reported some of the blades of bright red.  H/H is being monitored.  Significant elevation in AST, panel trending downwards.  ALT is also trended no worse.  GI input is highly appreciated.  GI team is directing care.  Prognosis remains guarded.  Right upper quadrant ultrasound revealed: 1. Cirrhotic hepatic morphology. Liver parenchyma is difficult to penetrate. Allowing for this, no focal lesion is seen. As clinically indicated, consider abdominal MRI, when patient is able to tolerate breath hold technique as an outpatient. 2. Small volume perihepatic ascites. 3. Gallstones. Upper normal gallbladder wall thickness is nonspecific in the setting of chronic liver disease and ascites. 4. No biliary dilatation.   Assessment & Plan:   Principal Problem:   Esophageal varices with bleeding (HCC) Active Problems:   Alcoholic cirrhosis of liver without ascites (HCC)   Type 2 diabetes mellitus with hyperglycemia, without long-term current use of insulin (HCC)   Symptomatic anemia   Hypotension   Hematemesis   * Esophageal  varices with bleeding (HCC) Secondary to alcoholic cirrhosis with portal hypertension and ongoing alcohol use -Last EGD with [facility] noted on 05/19/2020 with grade 2 and large esophageal varices with 5 bands placed.  Mild gastritis. -Continue IV Protonix and octreotide  infusion -Hemoglobin downward trended to 6.6 from 12.4.  Pt now s/p 3u pRBC and 1u Plt and continues to be hypotensive to SBP 70 with hemametesis. Will give 2 more units of pRBC with 2 units ahead.  -Will transfuse for Hgb of less than 9 due to ongoing hematemesis and risk of brisk bleeding with esophogeal varices. Follow H/H q2hrs  -start IV Rocephin for SBP prophylaxis  -updated GI on ongoing hematemesis. They will see in consultation.   05/09/2022: Patient has undergone EGD.  GI input is appreciated.  Please see above documentation. 05/10/2022: Continue to monitor H/H.  Transfuse PRBC as needed.   Hypotension Secondary to symptomatic anemia from likely esophageal variceal bleed.  Continue management as above. -goal of SBP >90 and MAP of 65 05/09/2022: Hypotension has resolved significantly.  Last documented blood pressure was 111/75 mmHg. 05/10/2022: BP stable today.   Type 2 diabetes mellitus with hyperglycemia, without long-term current use of insulin (HCC) Continue to optimize.   On sensitive sliding scale insulin protocol   Alcoholic cirrhosis of liver without ascites ([place]) Reports ongoing alcohol use of up to five 25oz beer daily. Last drink this morning. -On CIWA protocol   DVT prophylaxis: SCD Code Status: Full code Family Communication: Partner. Disposition Plan: This will depend on hospital course.  Prognosis is guarded   Consultants:  GI  Procedures:  EGD  Antimicrobials:  IV ceftriaxone   Subjective: -Rectal bleed reported..  Objective: Vitals:   05/10/22 0635 05/10/22 0810 05/10/22 1100 05/10/22 1455  BP:  122/82 129/77 124/77  Pulse:  79  71  Resp:      Temp: 98.2 F (36.8 C) 98.3 F (36.8 C) 98 F (36.7 C) 97.6 F (36.4 C)  TempSrc: Oral Oral Oral Oral  SpO2:  99%  99%  Weight:      Height:        Intake/Output Summary (Last 24 hours) at 05/10/2022 1635 Last data filed at 05/10/2022 1523 Gross per 24 hour  Intake 1677.35 ml  Output --  Net 1677.35  ml    Filed Weights   05/08/22 2247  Weight: 90.9 kg    Examination:  General exam: Appears calm and comfortable.  Patient is jaundice.  Patient is pale. Respiratory system: Clear to auscultation.  Cardiovascular system: S1 & S2 heard. Gastrointestinal system: Abdomen is obese, soft and nontender. No organomegaly or masses felt. Normal bowel sounds heard. Central nervous system: Alert and oriented. No focal neurological deficits. Extremities: No leg edema.  Data Reviewed: I have personally reviewed following labs and imaging studies  CBC: Recent Labs  Lab 05/08/22 1243 05/08/22 2226 05/10/22 0251 05/10/22 0931 05/10/22 1141 05/10/22 1401 05/10/22 1551  WBC 4.8  --   --   --   --   --   --   NEUTROABS 3.9  --   --   --   --   --   --   HGB 6.6*   < > 6.9* 7.8* 7.7* 7.3* 7.1*  HCT 22.4*   < > 21.2* 24.1* 23.0* 22.5* 21.7*  MCV 83.3  --   --   --   --   --   --   PLT 55*  --   --   --   --   --   --    < > =  values in this interval not displayed.    Basic Metabolic Panel: Recent Labs  Lab 05/08/22 1243 05/09/22 1856 05/10/22 1141  NA 138 138 139  K 3.4* 4.4 3.8  CL 110 115* 116*  CO2 15* 18* 18*  GLUCOSE 268* 247* 152*  BUN 8 24* 26*  CREATININE 0.76 0.87 0.92  CALCIUM 6.8* 6.9* 7.2*    GFR: Estimated Creatinine Clearance: 97.4 mL/min (by C-G formula based on SCr of 0.92 mg/dL). Liver Function Tests: Recent Labs  Lab 05/08/22 1243 05/09/22 1856 05/10/22 1141  AST 52* 1,113* 538*  ALT 28 684* 556*  ALKPHOS 71 65 69  BILITOT 2.0* 4.3* 4.7*  PROT 5.1* 5.0* 5.2*  ALBUMIN 2.0* 2.2* 2.3*    No results for input(s): "LIPASE", "AMYLASE" in the last 168 hours. No results for input(s): "AMMONIA" in the last 168 hours. Coagulation Profile: Recent Labs  Lab 05/08/22 1243  INR 1.9*    Cardiac Enzymes: No results for input(s): "CKTOTAL", "CKMB", "CKMBINDEX", "TROPONINI" in the last 168 hours. BNP (last 3 results) No results for input(s): "PROBNP" in  the last 8760 hours. HbA1C: Recent Labs    05/08/22 2226  HGBA1C 7.1*    CBG: Recent Labs  Lab 05/09/22 2357 05/10/22 0504 05/10/22 0736 05/10/22 1117 05/10/22 1630  GLUCAP 225* 210* 201* 156* 148*    Lipid Profile: No results for input(s): "CHOL", "HDL", "LDLCALC", "TRIG", "CHOLHDL", "LDLDIRECT" in the last 72 hours. Thyroid Function Tests: No results for input(s): "TSH", "T4TOTAL", "FREET4", "T3FREE", "THYROIDAB" in the last 72 hours. Anemia Panel: No results for input(s): "VITAMINB12", "FOLATE", "FERRITIN", "TIBC", "IRON", "RETICCTPCT" in the last 72 hours. Urine analysis:    Component Value Date/Time   COLORURINE AMBER (A) 02/24/2021 1050   APPEARANCEUR CLEAR 02/24/2021 1050   LABSPEC 1.029 02/24/2021 1050   PHURINE 6.0 02/24/2021 1050   GLUCOSEU >=500 (A) 02/24/2021 1050   HGBUR MODERATE (A) 02/24/2021 1050   BILIRUBINUR NEGATIVE 02/24/2021 1050   BILIRUBINUR small (A) 11/26/2017 1504   KETONESUR 20 (A) 02/24/2021 1050   PROTEINUR NEGATIVE 02/24/2021 1050   UROBILINOGEN >=8.0 (A) 11/26/2017 1504   NITRITE NEGATIVE 02/24/2021 1050   LEUKOCYTESUR NEGATIVE 02/24/2021 1050   Sepsis Labs: @LABRCNTIP (procalcitonin:4,lacticidven:4)  )No results found for this or any previous visit (from the past 240 hour(s)).       Radiology Studies: US Abdomen Limited RUQ (LIVER/GB)  Result Date: 05/10/2022 CLINICAL DATA:  Elevated LFTs. EXAM: ULTRASOUND ABDOMEN LIMITED RIGHT UPPER QUADRANT COMPARISON:  CT 03/17/2020 FINDINGS: Gallbladder: Physiologically distended. Gallstones. Upper normal gallbladder wall thickness 3 mm, nonspecific. No sonographic Murphy sign noted by sonographer. Common bile duct: Diameter: 5-6 mm, normal Liver: Heterogeneous, echogenic with lobulated contours. The liver parenchyma is difficult to penetrate. Portal vein is patent on color Doppler imaging with normal direction of blood flow towards the liver. Other: Small volume perihepatic ascites. IMPRESSION:  1. Cirrhotic hepatic morphology. Liver parenchyma is difficult to penetrate. Allowing for this, no focal lesion is seen. As clinically indicated, consider abdominal MRI, when patient is able to tolerate breath hold technique as an outpatient. 2. Small volume perihepatic ascites. 3. Gallstones. Upper normal gallbladder wall thickness is nonspecific in the setting of chronic liver disease and ascites. 4. No biliary dilatation. Electronically Signed   By: Keith Rake M.D.   On: 05/10/2022 16:04        Scheduled Meds:  insulin aspart  0-9 Units Subcutaneous Q4H   thiamine  100 mg Oral Daily   Or   thiamine  100  mg Intravenous Daily   Continuous Infusions:  sodium chloride 125 mL/hr at 05/09/22 1024   sodium chloride Stopped (05/08/22 1415)   cefTRIAXone (ROCEPHIN)  IV 1 g (05/09/22 1737)   octreotide (SANDOSTATIN) 500 mcg in sodium chloride 0.9 % 250 mL (2 mcg/mL) infusion 50 mcg/hr (05/10/22 1523)   pantoprazole 8 mg/hr (05/10/22 1523)     LOS: 2 days    Time spent: 35 minutes    Berton Mount, MD  [facility] Pager #: [phone] 7PM-7AM contact night coverage as above

## 2022-05-10 NOTE — Progress Notes (Signed)
Mobility Specialist Progress Note   05/10/22 1635  Mobility  Activity Contraindicated/medical hold   Pt inappropriate for mobility specialist at this time given level of complexity, physical assist, and/or precautions as advised by RN. Mobility specialist to hold today, will continue to follow for readiness.  Frederico Hamman Mobility Specialist MS [facility] #:  [phone] Acute Rehab Office:  [phone]

## 2022-05-10 NOTE — Progress Notes (Signed)
Critical Lab value report for Hemoglobin 6.9 g/dl received. MD made aware, got an order for 1 unit blood transfusion. Transfusion started at 0415. Will continue to monitor the patient.

## 2022-05-10 NOTE — Plan of Care (Signed)
  Problem: Education: Goal: Knowledge of General Education information will improve Description: Including pain rating scale, medication(s)/side effects and non-pharmacologic comfort measures Outcome: Progressing   Problem: Health Behavior/Discharge Planning: Goal: Ability to manage health-related needs will improve Outcome: Progressing   Problem: Clinical Measurements: Goal: Respiratory complications will improve Outcome: Progressing Goal: Cardiovascular complication will be avoided Outcome: Progressing   Problem: Coping: Goal: Level of anxiety will decrease Outcome: Progressing   Problem: Pain Managment: Goal: General experience of comfort will improve Outcome: Progressing   Problem: Clinical Measurements: Goal: Ability to maintain clinical measurements within normal limits will improve Outcome: Not Applicable Goal: Will remain free from infection Outcome: Not Applicable Goal: Diagnostic test results will improve Outcome: Not Applicable   Problem: Activity: Goal: Risk for activity intolerance will decrease Outcome: Not Applicable   Problem: Elimination: Goal: Will not experience complications related to bowel motility Outcome: Not Applicable Goal: Will not experience complications related to urinary retention Outcome: Not Applicable   Problem: Safety: Goal: Ability to remain free from injury will improve Outcome: Not Applicable   Problem: Skin Integrity: Goal: Risk for impaired skin integrity will decrease Outcome: Not Applicable   Problem: Education: Goal: Ability to describe self-care measures that may prevent or decrease complications (Diabetes Survival Skills Education) will improve Outcome: Not Applicable Goal: Individualized Educational Video(s) Outcome: Not Applicable   Problem: Coping: Goal: Ability to adjust to condition or change in health will improve Outcome: Not Applicable   Problem: Fluid Volume: Goal: Ability to maintain a balanced intake  and output will improve Outcome: Not Applicable   Problem: Health Behavior/Discharge Planning: Goal: Ability to identify and utilize available resources and services will improve Outcome: Not Applicable Goal: Ability to manage health-related needs will improve Outcome: Not Applicable   Problem: Metabolic: Goal: Ability to maintain appropriate glucose levels will improve Outcome: Not Applicable   Problem: Nutritional: Goal: Maintenance of adequate nutrition will improve Outcome: Not Applicable Goal: Progress toward achieving an optimal weight will improve Outcome: Not Applicable   Problem: Skin Integrity: Goal: Risk for impaired skin integrity will decrease Outcome: Not Applicable   Problem: Tissue Perfusion: Goal: Adequacy of tissue perfusion will improve Outcome: Not Applicable

## 2022-05-11 LAB — CBC WITH DIFFERENTIAL/PLATELET
Abs Immature Granulocytes: 0.08 10*3/uL — ABNORMAL HIGH (ref 0.00–0.07)
Basophils Absolute: 0 10*3/uL (ref 0.0–0.1)
Basophils Relative: 0 %
Eosinophils Absolute: 0 10*3/uL (ref 0.0–0.5)
Eosinophils Relative: 1 %
HCT: 25.9 % — ABNORMAL LOW (ref 39.0–52.0)
Hemoglobin: 8.4 g/dL — ABNORMAL LOW (ref 13.0–17.0)
Immature Granulocytes: 2 %
Lymphocytes Relative: 12 %
Lymphs Abs: 0.6 10*3/uL — ABNORMAL LOW (ref 0.7–4.0)
MCH: 27.9 pg (ref 26.0–34.0)
MCHC: 32.4 g/dL (ref 30.0–36.0)
MCV: 86 fL (ref 80.0–100.0)
Monocytes Absolute: 0.6 10*3/uL (ref 0.1–1.0)
Monocytes Relative: 12 %
Neutro Abs: 3.4 10*3/uL (ref 1.7–7.7)
Neutrophils Relative %: 73 %
Platelets: 45 10*3/uL — ABNORMAL LOW (ref 150–400)
RBC: 3.01 MIL/uL — ABNORMAL LOW (ref 4.22–5.81)
RDW: 16.8 % — ABNORMAL HIGH (ref 11.5–15.5)
WBC: 4.7 10*3/uL (ref 4.0–10.5)
nRBC: 1.3 % — ABNORMAL HIGH (ref 0.0–0.2)

## 2022-05-11 LAB — COMPREHENSIVE METABOLIC PANEL
ALT: 391 U/L — ABNORMAL HIGH (ref 0–44)
AST: 260 U/L — ABNORMAL HIGH (ref 15–41)
Albumin: 2.2 g/dL — ABNORMAL LOW (ref 3.5–5.0)
Alkaline Phosphatase: 71 U/L (ref 38–126)
Anion gap: 6 (ref 5–15)
BUN: 21 mg/dL — ABNORMAL HIGH (ref 6–20)
CO2: 17 mmol/L — ABNORMAL LOW (ref 22–32)
Calcium: 7.4 mg/dL — ABNORMAL LOW (ref 8.9–10.3)
Chloride: 115 mmol/L — ABNORMAL HIGH (ref 98–111)
Creatinine, Ser: 0.79 mg/dL (ref 0.61–1.24)
GFR, Estimated: >60 mL/min (ref >60)
Glucose, Bld: 144 mg/dL — ABNORMAL HIGH (ref 70–99)
Potassium: 4.2 mmol/L (ref 3.5–5.1)
Sodium: 138 mmol/L (ref 135–145)
Total Bilirubin: 7.2 mg/dL — ABNORMAL HIGH (ref 0.3–1.2)
Total Protein: 5.2 g/dL — ABNORMAL LOW (ref 6.5–8.1)

## 2022-05-11 LAB — PREPARE RBC (CROSSMATCH)

## 2022-05-11 LAB — HEMOGLOBIN AND HEMATOCRIT, BLOOD
HCT: 21.2 % — ABNORMAL LOW (ref 39.0–52.0)
HCT: 24.6 % — ABNORMAL LOW (ref 39.0–52.0)
HCT: 26.3 % — ABNORMAL LOW (ref 39.0–52.0)
HCT: 25.4 % — ABNORMAL LOW (ref 39.0–52.0)
Hemoglobin: 6.9 g/dL — CL (ref 13.0–17.0)
Hemoglobin: 8.1 g/dL — ABNORMAL LOW (ref 13.0–17.0)
Hemoglobin: 8.7 g/dL — ABNORMAL LOW (ref 13.0–17.0)
Hemoglobin: 8.3 g/dL — ABNORMAL LOW (ref 13.0–17.0)

## 2022-05-11 LAB — GLUCOSE, CAPILLARY
Glucose-Capillary: 132 mg/dL — ABNORMAL HIGH (ref 70–99)
Glucose-Capillary: 144 mg/dL — ABNORMAL HIGH (ref 70–99)
Glucose-Capillary: 149 mg/dL — ABNORMAL HIGH (ref 70–99)
Glucose-Capillary: 159 mg/dL — ABNORMAL HIGH (ref 70–99)
Glucose-Capillary: 205 mg/dL — ABNORMAL HIGH (ref 70–99)
Glucose-Capillary: 226 mg/dL — ABNORMAL HIGH (ref 70–99)

## 2022-05-11 MED ORDER — SODIUM CHLORIDE 0.9% IV SOLUTION: Freq: Once | INTRAVENOUS | Status: AC

## 2022-05-11 MED ORDER — PANTOPRAZOLE SODIUM 40 MG IV SOLR
40.0000 mg | Freq: Two times a day (BID) | INTRAVENOUS | Status: DC
  Administered 2022-05-11 – 2022-05-13 (×4): 40 mg via INTRAVENOUS
  Filled 2022-05-11 (×4): qty 10

## 2022-05-11 NOTE — Plan of Care (Signed)

## 2022-05-11 NOTE — Progress Notes (Signed)
PROGRESS NOTE    Wesley Williams  LKG:[id number] DOB: [birth year] DOA: 05/08/2022 PCP: Georgina Quint, MD  Outpatient Specialists:     Brief Narrative:  Patient is a 51 year old male, with history of significant alcoholic cirrhosis and esophageal varices s/p banding, type 2 diabetes mellitus and chronic thrombocytopenia.  Patient was admitted with severe variceal bleed.  Patient continues to drink alcohol.  Patient underwent EGD that revealed multiple varices that were banded.  Post EGD has been complicated by intermittent drop in H/H.  GI team has been directing care.  Patient was n.p.o. except for ice chips for a couple of days, but diet has been recommended by the GI team today, 05/11/2022.  Abnormal LFTs worsened due to shock liver.  AST and ALT are improving.  EGD revealed: "- The examined portions of the nasopharynx, oropharynx and larynx were normal. - Grade III esophageal varices. Incompletely eradicated. Banded. - Red blood in the lower third of the esophagus. - No specimens collected".  Right upper quadrant ultrasound revealed: 1. Cirrhotic hepatic morphology. Liver parenchyma is difficult to penetrate. Allowing for this, no focal lesion is seen. As clinically indicated, consider abdominal MRI, when patient is able to tolerate breath hold technique as an outpatient. 2. Small volume perihepatic ascites. 3. Gallstones. Upper normal gallbladder wall thickness is nonspecific in the setting of chronic liver disease and ascites. 4. No biliary dilatation.  05/11/2022: Patient seen alongside patient's nurse.  Minimal blood noted per rectum.  GI input is appreciated.   Assessment & Plan:   Principal Problem:   Esophageal varices with bleeding (HCC) Active Problems:   Alcoholic cirrhosis of liver without ascites (HCC)   Type 2 diabetes mellitus with hyperglycemia, without long-term current use of insulin (HCC)   Symptomatic anemia   Hypotension   Hematemesis   * Esophageal  varices with bleeding (HCC) -Secondary to alcoholic cirrhosis with portal hypertension and ongoing alcohol use -Last EGD with [facility] noted on 05/19/2020 with grade 2 and large esophageal varices with 5 bands placed.  Mild gastritis. -Patient continues to drink alcohol. -EGD done on this admission is as documented above. -Continue Protonix and octreotide. -Continue to monitor H/H and transfuse packed red blood cells as needed. -Patient has been counseled several related to quit alcohol use. -Guarded long-term prognosis. -Continue IV Rocephin for SBP prophylaxis. -GI input is appreciated.   Hypotension/shock liver: -Secondary to symptomatic anemia from likely esophageal variceal bleed.   -Initial significantly elevated liver enzymes are trending downwards.     Type 2 diabetes mellitus with hyperglycemia, without long-term current use of insulin (HCC) Continue to optimize.   On sensitive sliding scale insulin protocol   Alcoholic cirrhosis of liver without ascites (HCC) Reports ongoing alcohol use of up to five 25oz beer daily. Last drink this morning. -On CIWA protocol   DVT prophylaxis: SCD Code Status: Full code Family Communication: Partner. Disposition Plan: This will depend on hospital course.  Prognosis is guarded   Consultants:  GI  Procedures:  EGD  Antimicrobials:  IV ceftriaxone   Subjective: -Rectal bleed reported, significantly improved.  Objective: Vitals:   05/11/22 0329 05/11/22 0546 05/11/22 0732 05/11/22 1408  BP: 119/77 121/88 134/82 (!) 140/87  Pulse: 65 73 64 75  Resp: 15 16    Temp: 98.1 F (36.7 C) 98 F (36.7 C) 98.2 F (36.8 C) 98.2 F (36.8 C)  TempSrc: Oral Oral Oral Oral  SpO2: 99% 99% 98% 100%  Weight:      Height:  Intake/Output Summary (Last 24 hours) at 05/11/2022 1657 Last data filed at 05/11/2022 0725 Gross per 24 hour  Intake 437.25 ml  Output 250 ml  Net 187.25 ml    Filed Weights   05/08/22 2247  Weight: 90.9  kg    Examination:  General exam: Appears calm and comfortable.  Patient is jaundice.  Patient is pale. Respiratory system: Clear to auscultation.  Cardiovascular system: S1 & S2 heard. Gastrointestinal system: Abdomen is obese, soft and nontender. No organomegaly or masses felt. Normal bowel sounds heard. Central nervous system: Alert and oriented. No focal neurological deficits. Extremities: No leg edema.  Data Reviewed: I have personally reviewed following labs and imaging studies  CBC: Recent Labs  Lab 05/08/22 1243 05/08/22 2226 05/10/22 2239 05/11/22 0106 05/11/22 0804 05/11/22 1140 05/11/22 1558  WBC 4.8  --   --   --  4.7  --   --   NEUTROABS 3.9  --   --   --  3.4  --   --   HGB 6.6*   < > 7.2* 6.9* 8.4* 8.1* 8.7*  HCT 22.4*   < > 20.9* 21.2* 25.9* 24.6* 26.3*  MCV 83.3  --   --   --  86.0  --   --   PLT 55*  --   --   --  45*  --   --    < > = values in this interval not displayed.    Basic Metabolic Panel: Recent Labs  Lab 05/08/22 1243 05/09/22 1856 05/10/22 1141 05/11/22 0804  NA 138 138 139 138  K 3.4* 4.4 3.8 4.2  CL 110 115* 116* 115*  CO2 15* 18* 18* 17*  GLUCOSE 268* 247* 152* 144*  BUN 8 24* 26* 21*  CREATININE 0.76 0.87 0.92 0.79  CALCIUM 6.8* 6.9* 7.2* 7.4*    GFR: Estimated Creatinine Clearance: 112 mL/min (by C-G formula based on SCr of 0.79 mg/dL). Liver Function Tests: Recent Labs  Lab 05/08/22 1243 05/09/22 1856 05/10/22 1141 05/11/22 0804  AST 52* 1,113* 538* 260*  ALT 28 684* 556* 391*  ALKPHOS 71 65 69 71  BILITOT 2.0* 4.3* 4.7* 7.2*  PROT 5.1* 5.0* 5.2* 5.2*  ALBUMIN 2.0* 2.2* 2.3* 2.2*    No results for input(s): "LIPASE", "AMYLASE" in the last 168 hours. No results for input(s): "AMMONIA" in the last 168 hours. Coagulation Profile: Recent Labs  Lab 05/08/22 1243  INR 1.9*    Cardiac Enzymes: No results for input(s): "CKTOTAL", "CKMB", "CKMBINDEX", "TROPONINI" in the last 168 hours. BNP (last 3 results) No  results for input(s): "PROBNP" in the last 8760 hours. HbA1C: Recent Labs    05/08/22 2226  HGBA1C 7.1*    CBG: Recent Labs  Lab 05/11/22 0024 05/11/22 0434 05/11/22 0733 05/11/22 1136 05/11/22 1633  GLUCAP 144* 159* 132* 149* 226*    Lipid Profile: No results for input(s): "CHOL", "HDL", "LDLCALC", "TRIG", "CHOLHDL", "LDLDIRECT" in the last 72 hours. Thyroid Function Tests: No results for input(s): "TSH", "T4TOTAL", "FREET4", "T3FREE", "THYROIDAB" in the last 72 hours. Anemia Panel: No results for input(s): "VITAMINB12", "FOLATE", "FERRITIN", "TIBC", "IRON", "RETICCTPCT" in the last 72 hours. Urine analysis:    Component Value Date/Time   COLORURINE AMBER (A) 02/24/2021 1050   APPEARANCEUR CLEAR 02/24/2021 1050   LABSPEC 1.029 02/24/2021 1050   PHURINE 6.0 02/24/2021 1050   GLUCOSEU >=500 (A) 02/24/2021 1050   HGBUR MODERATE (A) 02/24/2021 1050   BILIRUBINUR NEGATIVE 02/24/2021 1050   BILIRUBINUR small (A)  11/26/2017 1504   KETONESUR 20 (A) 02/24/2021 1050   PROTEINUR NEGATIVE 02/24/2021 1050   UROBILINOGEN >=8.0 (A) 11/26/2017 1504   NITRITE NEGATIVE 02/24/2021 1050   LEUKOCYTESUR NEGATIVE 02/24/2021 1050   Sepsis Labs: @LABRCNTIP (procalcitonin:4,lacticidven:4)  )No results found for this or any previous visit (from the past 240 hour(s)).       Radiology Studies: Abdomen Limited RUQ (LIVER/GB)  Result Date: 05/10/2022 CLINICAL DATA:  Elevated LFTs. EXAM: ULTRASOUND ABDOMEN LIMITED RIGHT UPPER QUADRANT COMPARISON:  CT 03/17/2020 FINDINGS: Gallbladder: Physiologically distended. Gallstones. Upper normal gallbladder wall thickness 3 mm, nonspecific. No sonographic Murphy sign noted by sonographer. Common bile duct: Diameter: 5-6 mm, normal Liver: Heterogeneous, echogenic with lobulated contours. The liver parenchyma is difficult to penetrate. Portal vein is patent on color Doppler imaging with normal direction of blood flow towards the liver. Other: Small volume  perihepatic ascites. IMPRESSION: 1. Cirrhotic hepatic morphology. Liver parenchyma is difficult to penetrate. Allowing for this, no focal lesion is seen. As clinically indicated, consider abdominal MRI, when patient is able to tolerate breath hold technique as an outpatient. 2. Small volume perihepatic ascites. 3. Gallstones. Upper normal gallbladder wall thickness is nonspecific in the setting of chronic liver disease and ascites. 4. No biliary dilatation. Electronically Signed   By: 05/18/2020 M.D.   On: 05/10/2022 16:04        Scheduled Meds:  insulin aspart  0-9 Units Subcutaneous Q4H   pantoprazole (PROTONIX) IV  40 mg Intravenous Q12H   thiamine  100 mg Oral Daily   Or   thiamine  100 mg Intravenous Daily   Continuous Infusions:  sodium chloride Stopped (05/08/22 1415)   cefTRIAXone (ROCEPHIN)  IV 200 mL/hr at 05/10/22 1743     LOS: 3 days    Time spent: 35 minutes    07/10/22, MD  [facility] Pager #: [phone] 7PM-7AM contact night coverage as above

## 2022-05-11 NOTE — Progress Notes (Addendum)
Daily Rounding Note  05/11/2022, 1:08 PM  LOS: 3 days   SUBJECTIVE:   Chief complaint:    Cirrhosis.  Variceal bleed.  Is not required Ativan for signs/symptoms of withdrawal PRBC x1 ordered for Hb 6.9 Pt feels well.  No nausea or vomiting.  No abdominal pain.  No dizziness. Decreased stool frequency.  They are still red, bloody but smaller volume and has only had 2 so far today.  OBJECTIVE:         Vital signs in last 24 hours:    Temp:  [97.6 F (36.4 C)-98.6 F (37 C)] 98.2 F (36.8 C) (09/05 0732) Pulse Rate:  [64-85] 64 (09/05 0732) Resp:  [15-16] 16 (09/05 0546) BP: (119-134)/(75-88) 134/82 (09/05 0732) SpO2:  [98 %-100 %] 98 % (09/05 0732) Last BM Date : 05/10/22 Filed Weights   05/08/22 2247  Weight: 90.9 kg   General: Looks well.  NAD.  Comfortable and alert Heart: RRR. Chest: No labored breathing or cough.  Lungs clear bilaterally Abdomen: Soft.  Not tender or distended.  No HSM, masses, bruits, hernias Extremities: No CCE. Neuro/Psych: No asterixis.  Not confused.  Fluid speech.  Moves all 4 limbs.  Intake/Output from previous day: 09/04 0701 - 09/05 0700 In: 757 [I.V.:648.4; IV Piggyback:108.6] Out: -   Intake/Output this shift: Total I/O In: -  Out: 250 [Urine:250]  Lab Results: Recent Labs    05/11/22 0106 05/11/22 0804 05/11/22 1140  WBC  --  4.7  --   HGB 6.9* 8.4* 8.1*  HCT 21.2* 25.9* 24.6*  PLT  --  45*  --    BMET Recent Labs    05/09/22 1856 05/10/22 1141 05/11/22 0804  NA 138 139 138  K 4.4 3.8 4.2  CL 115* 116* 115*  CO2 18* 18* 17*  GLUCOSE 247* 152* 144*  BUN 24* 26* 21*  CREATININE 0.87 0.92 0.79  CALCIUM 6.9* 7.2* 7.4*   LFT Recent Labs    05/09/22 1856 05/10/22 1141 05/11/22 0804  PROT 5.0* 5.2* 5.2*  ALBUMIN 2.2* 2.3* 2.2*  AST 1,113* 538* 260*  ALT 684* 556* 391*  ALKPHOS 65 69 71  BILITOT 4.3* 4.7* 7.2*  BILIDIR  --  2.7*  --   IBILI  --  2.0*   --    PT/INR No results for input(s): "LABPROT", "INR" in the last 72 hours. Hepatitis Panel No results for input(s): "HEPBSAG", "HCVAB", "HEPAIGM", "HEPBIGM" in the last 72 hours.  Studies/Results: US Abdomen Limited RUQ (LIVER/GB)  Result Date: 05/10/2022 CLINICAL DATA:  Elevated LFTs. EXAM: ULTRASOUND ABDOMEN LIMITED RIGHT UPPER QUADRANT COMPARISON:  CT 03/17/2020 FINDINGS: Gallbladder: Physiologically distended. Gallstones. Upper normal gallbladder wall thickness 3 mm, nonspecific. No sonographic Murphy sign noted by sonographer. Common bile duct: Diameter: 5-6 mm, normal Liver: Heterogeneous, echogenic with lobulated contours. The liver parenchyma is difficult to penetrate. Portal vein is patent on color Doppler imaging with normal direction of blood flow towards the liver. Other: Small volume perihepatic ascites. IMPRESSION: 1. Cirrhotic hepatic morphology. Liver parenchyma is difficult to penetrate. Allowing for this, no focal lesion is seen. As clinically indicated, consider abdominal MRI, when patient is able to tolerate breath hold technique as an outpatient. 2. Small volume perihepatic ascites. 3. Gallstones. Upper normal gallbladder wall thickness is nonspecific in the setting of chronic liver disease and ascites. 4. No biliary dilatation. Electronically Signed   By: Narda Rutherford M.D.   On: 05/10/2022 16:04  ASSESMENT:   Variceal upper GI bleed.  9/3 EGD with active hemorrhage from varix, mild esophagitis.  Varix treated, hemostasis obtained after 4 bands placed. Octreotide, PPI in place.  Day 5 ceftriaxone.  Blood loss anemia.  Hgb 7.3.. 6.9.Wesley KitchenMarland Williams 1 PRBC... 8.4.      Thrombocytopenia.  Chronic.  Platelets 45 K  Acutely elevated LFTs. Transaminases to 1113/684... 260/391 today. T bili 2.0.. 7.2   Alcohol use disorder, dependence.  CIWA protocol in place.  DM.    Small volume perihepatic ascites.   PLAN   If re-bleeding from varices, needs TIPS.    Carb mod diet.     72 hours octreotide 72-hour PPI drip finishes today at 2:15 PM.  Continue Protonix 40 IV bid starting this evening    Wesley Williams  05/11/2022, 1:08 PM Phone [phone]    Attending physician's note   I have taken a history, reviewed the chart and examined the patient. I performed a substantive portion of this encounter, including complete performance of at least one of the key components, in conjunction with the APP. I agree with the APP's note, impression and recommendations.    Decompensated alcoholic cirrhosis, variceal hemorrhage s/p band ligation MELD 3.0: 21 at 05/10/2022 11:41 AM MELD-Na: 19 at 05/10/2022 11:41 AM  Patient is at high risk for TIPS related complications given high MELD score  On octreotide gtt. Start beta-blocker for secondary prophylaxis, nadolol 20 mg daily after completion of octreotide PPI twice daily Advance diet as tolerated Discussed alcohol cessation  Acute elevation in transaminases likely secondary to hypoperfusion of liver in the setting of acute GI bleed, improving  Repeat EGD in 4 weeks for evaluation of esophageal varices and rebanding as needed GI office follow-up in 2 to 3 weeks with Dr. Leone Payor or APP. Repeat CBC, CMP, PT and INR in 1 week  GI is available if needed, please call with any questions  The patient was provided an opportunity to ask questions and all were answered. The patient agreed with the plan and demonstrated an understanding of the instructions.   Wesley Williams , MD [phone]

## 2022-05-11 NOTE — Progress Notes (Signed)
Got critical lab report for his Hb 6.9 g/dl, MD notified. Received an order for 1 unit blood transfusion, BT started at 0300 and completed at 0546. Will continue monitoring the patient.

## 2022-05-11 NOTE — Plan of Care (Signed)
  Problem: Education: Goal: Knowledge of General Education information will improve Description: Including pain rating scale, medication(s)/side effects and non-pharmacologic comfort measures Outcome: Progressing   Problem: Health Behavior/Discharge Planning: Goal: Ability to manage health-related needs will improve Outcome: Progressing   Problem: Nutrition: Goal: Adequate nutrition will be maintained Outcome: Progressing   

## 2022-05-11 NOTE — Progress Notes (Signed)
   05/11/22 1615  Clinical Encounter Type  Visited With Patient  Visit Type Initial;Other (Comment) (Advanced Directive)  Referral From Nurse  Consult/Referral To Chaplain   Chaplain responded to a spiritual consult for advanced directive education.  The patient, Wesley Williams, had paperwork already in Spanish but asked that I go over it with him in [place].  So we went through the paperwork and I left the English one with him also. I advised Jawanza that if he had any additional questions to ask his nurse to contact us and a chaplain would respond. Elma thanked me for coming as I departed.   Valerie Roys [facility]  [phone]

## 2022-05-12 LAB — BPAM RBC
Blood Product Expiration Date: 202309232359
Blood Product Expiration Date: 202309232359
Blood Product Expiration Date: 202309242359
Blood Product Expiration Date: 202309252359
Blood Product Expiration Date: 202309252359
Blood Product Expiration Date: 202309252359
Blood Product Expiration Date: 202309252359
Blood Product Expiration Date: 202309272359
ISSUE DATE / TIME: 202309021416
ISSUE DATE / TIME: 202309021620
ISSUE DATE / TIME: 202309030551
ISSUE DATE / TIME: 202309031458
ISSUE DATE / TIME: 202309040405
ISSUE DATE / TIME: 202309050305
ISSUE DATE / TIME: 202309060359
ISSUE DATE / TIME: 202309060609
Unit Type and Rh: 6200
Unit Type and Rh: 6200
Unit Type and Rh: 6200
Unit Type and Rh: 6200
Unit Type and Rh: 6200
Unit Type and Rh: 6200
Unit Type and Rh: 6200
Unit Type and Rh: 6200

## 2022-05-12 LAB — TYPE AND SCREEN
ABO/RH(D): A POS
Antibody Screen: NEGATIVE
Unit division: 0
Unit division: 0
Unit division: 0
Unit division: 0
Unit division: 0
Unit division: 0
Unit division: 0
Unit division: 0

## 2022-05-12 LAB — GLUCOSE, CAPILLARY
Glucose-Capillary: 163 mg/dL — ABNORMAL HIGH (ref 70–99)
Glucose-Capillary: 160 mg/dL — ABNORMAL HIGH (ref 70–99)
Glucose-Capillary: 220 mg/dL — ABNORMAL HIGH (ref 70–99)
Glucose-Capillary: 126 mg/dL — ABNORMAL HIGH (ref 70–99)
Glucose-Capillary: 164 mg/dL — ABNORMAL HIGH (ref 70–99)

## 2022-05-12 LAB — HEMOGLOBIN AND HEMATOCRIT, BLOOD
HCT: 26.8 % — ABNORMAL LOW (ref 39.0–52.0)
HCT: 27.2 % — ABNORMAL LOW (ref 39.0–52.0)
Hemoglobin: 8.9 g/dL — ABNORMAL LOW (ref 13.0–17.0)
Hemoglobin: 9.1 g/dL — ABNORMAL LOW (ref 13.0–17.0)

## 2022-05-12 LAB — AFP TUMOR MARKER: AFP, Serum, Tumor Marker: 2.1 ng/mL (ref 0.0–8.4)

## 2022-05-12 NOTE — Progress Notes (Signed)
Mobility Specialist Progress Note   05/12/22 1050  Mobility  Activity Ambulated independently in hallway  Level of Assistance Independent after set-up  Assistive Device None  Distance Ambulated (ft) 550 ft  Activity Response Tolerated well  $Mobility charge 1 Mobility   Pre Mobility: 64 HR, 147/91 BP, 99% SpO2 During Mobility: 135 HR Post Mobility: 69 HR, 156/89 BP, 98% SpO2  Patient received in bed agreeable to participate in mobility. Ambulated in hallway independently with steady gait. Returned to room without complaint or incident. Was left back in bed with all needs met, call bell in reach.   Holland Falling Mobility Specialist MS [facility] #:  [phone] Acute Rehab Office:  [phone]

## 2022-05-12 NOTE — Progress Notes (Addendum)
Daily Rounding Note  05/12/2022, 1:47 PM  LOS: 4 days   SUBJECTIVE:   Chief complaint: Variceal bleed.  Cirrhosis.  Patient is feeling well.  Last bowel movement was about 730 this morning.  Was not grossly bloody.  He has been walking in the hallways and experiencing no dizziness, weakness, pain. Tolerating solid food.  OBJECTIVE:         Vital signs in last 24 hours:    Temp:  [98 F (36.7 C)-98.5 F (36.9 C)] 98 F (36.7 C) (09/06 1300) Pulse Rate:  [75-83] 83 (09/05 2218) Resp:  [16] 16 (09/05 2218) BP: (127-140)/(84-88) 127/86 (09/06 1300) SpO2:  [100 %] 100 % (09/05 2218) Last BM Date : 05/11/22 Filed Weights   05/08/22 2247  Weight: 90.9 kg   General: Looks well.  Slight jaundice which is hard to appreciate because of his baseline all of skin coloration. Heart: RRR. Chest: Breathing or cough Abdomen: Soft without tenderness.  Active bowel sounds. Extremities: No CCE. Neuro/Psych: No asterixis or tremors.  Fully alert and oriented with fluid speech.  Calm.  Intake/Output from previous day: 09/05 0701 - 09/06 0700 In: 1972.4 [I.V.:1878.6; IV Piggyback:93.8] Out: 250 [Urine:250]  Intake/Output this shift: Total I/O In: 240 [P.O.:240] Out: 501 [Urine:500; Stool:1]  Lab Results: Recent Labs    05/11/22 0804 05/11/22 1140 05/11/22 1558 05/11/22 1824 05/12/22 0730  WBC 4.7  --   --   --   --   HGB 8.4*   < > 8.7* 8.3* 8.9*  HCT 25.9*   < > 26.3* 25.4* 26.8*  PLT 45*  --   --   --   --    < > = values in this interval not displayed.   BMET Recent Labs    05/09/22 1856 05/10/22 1141 05/11/22 0804  NA 138 139 138  K 4.4 3.8 4.2  CL 115* 116* 115*  CO2 18* 18* 17*  GLUCOSE 247* 152* 144*  BUN 24* 26* 21*  CREATININE 0.87 0.92 0.79  CALCIUM 6.9* 7.2* 7.4*   LFT Recent Labs    05/09/22 1856 05/10/22 1141 05/11/22 0804  PROT 5.0* 5.2* 5.2*  ALBUMIN 2.2* 2.3* 2.2*  AST 1,113* 538* 260*   ALT 684* 556* 391*  ALKPHOS 65 69 71  BILITOT 4.3* 4.7* 7.2*  BILIDIR  --  2.7*  --   IBILI  --  2.0*  --    PT/INR No results for input(s): "LABPROT", "INR" in the last 72 hours. Hepatitis Panel No results for input(s): "HEPBSAG", "HCVAB", "HEPAIGM", "HEPBIGM" in the last 72 hours.  Studies/Results: US Abdomen Limited RUQ (LIVER/GB)  Result Date: 05/10/2022 CLINICAL DATA:  Elevated LFTs. EXAM: ULTRASOUND ABDOMEN LIMITED RIGHT UPPER QUADRANT COMPARISON:  CT 03/17/2020 FINDINGS: Gallbladder: Physiologically distended. Gallstones. Upper normal gallbladder wall thickness 3 mm, nonspecific. No sonographic Murphy sign noted by sonographer. Common bile duct: Diameter: 5-6 mm, normal Liver: Heterogeneous, echogenic with lobulated contours. The liver parenchyma is difficult to penetrate. Portal vein is patent on color Doppler imaging with normal direction of blood flow towards the liver. Other: Small volume perihepatic ascites. IMPRESSION: 1. Cirrhotic hepatic morphology. Liver parenchyma is difficult to penetrate. Allowing for this, no focal lesion is seen. As clinically indicated, consider abdominal MRI, when patient is able to tolerate breath hold technique as an outpatient. 2. Small volume perihepatic ascites. 3. Gallstones. Upper normal gallbladder wall thickness is nonspecific in the setting of chronic liver disease and ascites. 4.  No biliary dilatation. Electronically Signed   By: Narda Rutherford M.D.   On: 05/10/2022 16:04    Scheduled Meds:  insulin aspart  0-9 Units Subcutaneous Q4H   pantoprazole (PROTONIX) IV  40 mg Intravenous Q12H   thiamine  100 mg Oral Daily   Or   thiamine  100 mg Intravenous Daily   Continuous Infusions:  sodium chloride Stopped (05/08/22 1415)   cefTRIAXone (ROCEPHIN)  IV 1 g (05/11/22 1824)   PRN Meds:.ondansetron (ZOFRAN) IV  ASSESMENT:   Variceal upper GI bleed.  9/3 EGD with active hemorrhage from varix, mild esophagitis.  Varix treated, hemostasis  obtained after 4 bands placed. Octreotide and PPI 72 h completed.  Day 6 ceftriaxone.   Blood loss anemia.  Hgb 7.3.. 6.9.Marland KitchenMarland Kitchen 1 PRBC... 8.4.       Thrombocytopenia.  Chronic.  Platelets 45 K   Acutely elevated LFTs. Transaminases to 1113/684... 260/391 today. T bili 2.0.. 7.2    Alcohol use disorder, dependence.  CIWA protocol in place.   DM.     Small volume perihepatic ascites.    PLAN     GI signing off.  Fup w GI APP or Dr Leone Payor in ~ 1 month.  Will need repeat EGD within the next 4 to 6 weeks to reassess varices and rebandage as needed.  Complete alcohol abstinence.  Patient aware but does not hurt to remind him  Repeat labs including CBC, PT/INR and CMP in about a week following discharge.    Jennye Moccasin  05/12/2022, 1:47 PM Phone [phone]    Attending physician's note   I have taken a history, reviewed the chart and examined the patient. I performed a substantive portion of this encounter, including complete performance of at least one of the key components, in conjunction with the APP. I agree with the APP's note, impression and recommendations.    MELD 3.0: 21 at 05/10/2022 11:41 AM MELD-Na: 19 at 05/10/2022 11:41 AM Decompensated cirrhosis with variceal hemorrhage s/p variceal banding  Hemoglobin remains stable with no further bleeding We will arrange for GI office follow-up visit and repeat EGD  Discussed alcohol cessation  GI will sign off but available if have any questions   The patient was provided an opportunity to ask questions and all were answered. The patient agreed with the plan and demonstrated an understanding of the instructions.   [name] , MD [phone]

## 2022-05-12 NOTE — Plan of Care (Signed)
  Problem: Education: Goal: Knowledge of General Education information will improve Description: Including pain rating scale, medication(s)/side effects and non-pharmacologic comfort measures Outcome: Not Progressing   Problem: Health Behavior/Discharge Planning: Goal: Ability to manage health-related needs will improve Outcome: Not Progressing   Problem: Clinical Measurements: Goal: Respiratory complications will improve Outcome: Not Progressing Goal: Cardiovascular complication will be avoided Outcome: Not Progressing   Problem: Nutrition: Goal: Adequate nutrition will be maintained Outcome: Not Progressing   Problem: Coping: Goal: Level of anxiety will decrease Outcome: Not Progressing

## 2022-05-12 NOTE — Plan of Care (Signed)
  Problem: Education: Goal: Knowledge of General Education information will improve Description: Including pain rating scale, medication(s)/side effects and non-pharmacologic comfort measures Outcome: Not Progressing   Problem: Health Behavior/Discharge Planning: Goal: Ability to manage health-related needs will improve Outcome: Not Progressing   Problem: Clinical Measurements: Goal: Respiratory complications will improve Outcome: Not Progressing Goal: Cardiovascular complication will be avoided Outcome: Not Progressing   Problem: Nutrition: Goal: Adequate nutrition will be maintained Outcome: Not Progressing   Problem: Coping: Goal: Level of anxiety will decrease Outcome: Not Progressing   Problem: Pain Managment: Goal: General experience of comfort will improve Outcome: Not Progressing

## 2022-05-12 NOTE — Progress Notes (Signed)
PROGRESS NOTE  Wesley Williams  DOB: 11-15-1970  PCP: Georgina Quint, MD GDJ:[id number]  DOA: 05/08/2022  LOS: 4 days  Hospital Day: 5  Brief narrative: Wesley Williams is a 51 y.o. male with PMH significant for chronic alcoholism, liver cirrhosis, portal hypertension, esophageal varices s/p banding 05/2020, DM2, chronic thrombocytopenia who continues to drink alcohol. Patient was brought to the ED on 9/2 after episode of hematemesis leading to syncope. EMS noted blood pressure low at 80s, given fluid resuscitation, brought to ED In the ED, hemoglobin was low at 6.6, platelet low at 55 Given PRBCs, platelet infusion.  Started on Protonix drip as well as octreotide IV. Admitted to hospitalist service for severe variceal bleeding GI was consulted See below for details.  Subjective: Patient was seen and examined this morning.  Pleasant middle-aged Hispanic male.  Lying down in bed.  Not in distress.  Passing melanotic stool this morning still.  Assessment and plan: Esophageal varices with bleeding Secondary to alcoholic cirrhosis with portal hypertension and ongoing alcohol use 9/3, underwent EGD, noted to have active hemorrhage from grade 3 esophageal varices, 4 bands placed. Currently on octreotide drip, Protonix 40 mg IV twice daily as well as IV ceftriaxone for SBP prophylaxis. GI following.  Repeat EGD in 4 weeks recommended. Currently on regular consistency diet.  Acute on chronic blood loss anemia Presented with a low hemoglobin of 6.6, due to severe variceal bleeding Received 6 units of PRBC so far, last transfusion was 24 hours prior.  Hemoglobin is stable since then however patient is still passing melanotic stool.  Probably clearing out the previous bleeding. Continue to monitor hemoglobin till tomorrow.  If stable tomorrow morning, plan to discharge home. Recent Labs    05/08/22 1243 05/08/22 2226 05/11/22 0804 05/11/22 1140 05/11/22 1558 05/11/22 1824 05/12/22 0730   HGB 6.6*   < > 8.4* 8.1* 8.7* 8.3* 8.9*  MCV 83.3  --  86.0  --   --   --   --    < > = values in this interval not displayed.   Chronic thrombocytopenia Chronically low platelets.  1 unit of platelet transfer was given on admission.  Continue to monitor.  Avoid heparin products. Recent Labs  Lab 05/08/22 1243 05/11/22 0804  PLT 55* 45*   Chronic alcoholism Continues to drink.  Extensively counseled to quit alcohol.   Watch out for withdrawal symptoms.   Continue CIWA protocol with as needed Ativan  Alcoholic liver cirrhosis with portal hypertension Noted recommendation from GI to start on nadolol 20 mg daily after completion of octreotide drip. 9/4, RUQ ultrasound noted only small volume ascites.  Clinically looks euvolemic.  Hypotension Initially hypotensive because severe bleeding. Blood pressure improved.  Currently in normal range.  Elevated liver enzymes Acute elevation in AST, ALT noted probably due to shock liver from hypotension. Improving trend.  Continue to monitor Recent Labs  Lab 05/08/22 1243 05/09/22 1856 05/10/22 1141 05/11/22 0804  AST 52* 1,113* 538* 260*  ALT 28 684* 556* 391*  ALKPHOS 71 65 69 71  BILITOT 2.0* 4.3* 4.7* 7.2*  BILIDIR  --   --  2.7*  --   IBILI  --   --  2.0*  --   PROT 5.1* 5.0* 5.2* 5.2*  ALBUMIN 2.0* 2.2* 2.3* 2.2*  INR 1.9*  --   --   --   PLT 55*  --   --  45*   Type 2 diabetes mellitus with hyperglycemia A1c 7.1 on  05/08/2022 PTA supposed to be on metformin 500 mg twice daily but apparently was noncompliant Currently on sliding scale insulin with Accu-Cheks. Recent Labs  Lab 05/11/22 1633 05/11/22 1955 05/12/22 0232 05/12/22 0722 05/12/22 1147  GLUCAP 226* 205* 163* 160* 220*     Goals of care   Code Status: Prior    Mobility: Encourage ambulation  Skin assessment:     Nutritional status:  Body mass index is 33.35 kg/m.          Diet:  Diet Order             Diet Carb Modified Fluid consistency:  Thin; Room service appropriate? Yes  Diet effective now                   DVT prophylaxis:  Place and maintain sequential compression device Start: 05/09/22 1014   Antimicrobials: None Fluid: None Consultants: GI Family Communication: None at bedside  Status is: Inpatient  Continue in-hospital care because: Repeat hemoglobin tomorrow Level of care: Progressive   Dispo: The patient is from: Home              Anticipated d/c is to: Hopefully home tomorrow if hemoglobin is stable              Patient currently is not medically stable to d/c.   Difficult to place patient No     Infusions:   sodium chloride Stopped (05/08/22 1415)   cefTRIAXone (ROCEPHIN)  IV 1 g (05/11/22 1824)    Scheduled Meds:  insulin aspart  0-9 Units Subcutaneous Q4H   pantoprazole (PROTONIX) IV  40 mg Intravenous Q12H   thiamine  100 mg Oral Daily   Or   thiamine  100 mg Intravenous Daily    PRN meds: ondansetron (ZOFRAN) IV   Antimicrobials: Anti-infectives (From admission, onward)    Start     Dose/Rate Route Frequency Ordered Stop   05/08/22 1800  cefTRIAXone (ROCEPHIN) 1 g in sodium chloride 0.9 % 100 mL IVPB        1 g 200 mL/hr over 30 Minutes Intravenous Every 24 hours 05/08/22 1617         Objective: Vitals:   05/11/22 2218 05/12/22 0725  BP: 131/84 131/88  Pulse: 83   Resp: 16   Temp: 98.5 F (36.9 C) 98.3 F (36.8 C)  SpO2: 100%     Intake/Output Summary (Last 24 hours) at 05/12/2022 1332 Last data filed at 05/12/2022 0945 Gross per 24 hour  Intake 2212.35 ml  Output 501 ml  Net 1711.35 ml   Filed Weights   05/08/22 2247  Weight: 90.9 kg   Weight change:  Body mass index is 33.35 kg/m.   Physical Exam: General exam: Pleasant, middle-aged Hispanic male.  Not in physical distress Skin: No rashes, lesions or ulcers. HEENT: Atraumatic, normocephalic, no obvious bleeding Lungs: Clear to auscultation bilaterally CVS: Regular rate and rhythm, no murmur GI/Abd  soft, nontender, nondistended, bowel sound present CNS: Alert, awake, oriented x3 Psychiatry: Mood appropriate Extremities: No pedal edema, no calf tenderness  Data Review: I have personally reviewed the laboratory data and studies available.  F/u labs ordered Unresulted Labs (From admission, onward)     Start     Ordered   05/13/22 0500  CBC with Differential/Platelet  Tomorrow morning,   R        05/12/22 0946   05/13/22 0500  Comprehensive metabolic panel  Tomorrow morning,   R  05/12/22 0946   05/13/22 0500  Magnesium  Tomorrow morning,   R        05/12/22 0946   05/13/22 0500  Phosphorus  Tomorrow morning,   R        05/12/22 0946   05/11/22 1700  Hemoglobin and hematocrit, blood  5A & 5P,   R (with TIMED occurrences)      05/11/22 1327            Signed, Lorin Glass, MD [facility] 05/12/2022

## 2022-05-12 NOTE — Discharge Instructions (Signed)
                  Intensive Outpatient Programs  [facility]    [facility] [address]     [address] [place],  [place]     [place], [place] [phone]      [phone]  [facility]   [facility]  (Inpatient and outpatient)  [phone] (Suboxone and Methadone) [address]           [phone]           [facility]    [facility] - Intensive Outpatient [address]     [address] [place], [place] [address]     [place], [place]  [phone]      [phone]  [facility] (Outpatient, Inpatient, Chemical  [facility] (Groups and Residental) (insurance only) [phone]    [place], [place]          [phone]       [facility]    [facility] (for caregivers and family) [address]     [address] [place], [place]     [place], [place] [phone]      [phone]  Residential Treatment Programs  [facility]  [facility](2 years) Residential: 90 days)  [facility] [address]      [address] [place], [place]     [place], [place] [phone]      [phone] or [phone]  [facility]    [facility] [address]      [address] [place], [place]     , [place] [phone]      [phone]  [facility]   [facility] ([facility] [address]     [address] [place], [place] [address]     [place], [place] [phone]      [phone] Admissions: 8am-3pm M-F  [facility]: Residential Program (90 Days)              [facility]: [facility]  [place], [place]     [place],   [phone] or [phone]    (Walk in Hours over the weekend or by referral)   [facility]: [facility]:[phone] (for crisis  response 24 hours a day) 

## 2022-05-12 NOTE — Plan of Care (Signed)
  Problem: Education: Goal: Knowledge of General Education information will improve Description: Including pain rating scale, medication(s)/side effects and non-pharmacologic comfort measures 05/12/2022 0718 by Gaspar Cola, RN Outcome: Progressing 05/11/2022 1829 by Gaspar Cola, RN Outcome: Progressing   Problem: Health Behavior/Discharge Planning: Goal: Ability to manage health-related needs will improve 05/12/2022 0718 by Gaspar Cola, RN Outcome: Progressing 05/11/2022 1829 by Gaspar Cola, RN Outcome: Progressing   Problem: Nutrition: Goal: Adequate nutrition will be maintained 05/12/2022 0718 by Gaspar Cola, RN Outcome: Progressing 05/11/2022 1829 by Gaspar Cola, RN Outcome: Progressing

## 2022-05-13 LAB — CBC WITH DIFFERENTIAL/PLATELET
Abs Immature Granulocytes: 0.1 10*3/uL — ABNORMAL HIGH (ref 0.00–0.07)
Basophils Absolute: 0 10*3/uL (ref 0.0–0.1)
Basophils Relative: 1 %
Eosinophils Absolute: 0.1 10*3/uL (ref 0.0–0.5)
Eosinophils Relative: 3 %
HCT: 27.5 % — ABNORMAL LOW (ref 39.0–52.0)
Hemoglobin: 8.8 g/dL — ABNORMAL LOW (ref 13.0–17.0)
Immature Granulocytes: 2 %
Lymphocytes Relative: 12 %
Lymphs Abs: 0.5 10*3/uL — ABNORMAL LOW (ref 0.7–4.0)
MCH: 27.3 pg (ref 26.0–34.0)
MCHC: 32 g/dL (ref 30.0–36.0)
MCV: 85.4 fL (ref 80.0–100.0)
Monocytes Absolute: 0.7 10*3/uL (ref 0.1–1.0)
Monocytes Relative: 17 %
Neutro Abs: 2.9 10*3/uL (ref 1.7–7.7)
Neutrophils Relative %: 65 %
Platelets: 54 10*3/uL — ABNORMAL LOW (ref 150–400)
RBC: 3.22 MIL/uL — ABNORMAL LOW (ref 4.22–5.81)
RDW: 18 % — ABNORMAL HIGH (ref 11.5–15.5)
WBC: 4.4 10*3/uL (ref 4.0–10.5)
nRBC: 0.9 % — ABNORMAL HIGH (ref 0.0–0.2)

## 2022-05-13 LAB — GLUCOSE, CAPILLARY
Glucose-Capillary: 151 mg/dL — ABNORMAL HIGH (ref 70–99)
Glucose-Capillary: 156 mg/dL — ABNORMAL HIGH (ref 70–99)
Glucose-Capillary: 203 mg/dL — ABNORMAL HIGH (ref 70–99)

## 2022-05-13 LAB — COMPREHENSIVE METABOLIC PANEL
ALT: 211 U/L — ABNORMAL HIGH (ref 0–44)
AST: 61 U/L — ABNORMAL HIGH (ref 15–41)
Albumin: 2.3 g/dL — ABNORMAL LOW (ref 3.5–5.0)
Alkaline Phosphatase: 82 U/L (ref 38–126)
Anion gap: 5 (ref 5–15)
BUN: 13 mg/dL (ref 6–20)
CO2: 20 mmol/L — ABNORMAL LOW (ref 22–32)
Calcium: 7.8 mg/dL — ABNORMAL LOW (ref 8.9–10.3)
Chloride: 112 mmol/L — ABNORMAL HIGH (ref 98–111)
Creatinine, Ser: 0.66 mg/dL (ref 0.61–1.24)
GFR, Estimated: >60 mL/min (ref >60)
Glucose, Bld: 154 mg/dL — ABNORMAL HIGH (ref 70–99)
Potassium: 3.4 mmol/L — ABNORMAL LOW (ref 3.5–5.1)
Sodium: 137 mmol/L (ref 135–145)
Total Bilirubin: 5.8 mg/dL — ABNORMAL HIGH (ref 0.3–1.2)
Total Protein: 5.5 g/dL — ABNORMAL LOW (ref 6.5–8.1)

## 2022-05-13 LAB — PHOSPHORUS: Phosphorus: 3.3 mg/dL (ref 2.5–4.6)

## 2022-05-13 LAB — MAGNESIUM: Magnesium: 1.5 mg/dL — ABNORMAL LOW (ref 1.7–2.4)

## 2022-05-13 MED ORDER — NADOLOL 20 MG PO TABS: 20.0000 mg | ORAL_TABLET | Freq: Every day | ORAL | 0 refills | Status: DC

## 2022-05-13 MED ORDER — METFORMIN HCL 500 MG PO TABS: 500.0000 mg | ORAL_TABLET | Freq: Two times a day (BID) | ORAL | 0 refills | Status: DC

## 2022-05-13 NOTE — Discharge Summary (Signed)
Physician Discharge Summary  Wesley Williams IWL:[id number] DOB: 1970-10-26 DOA: 05/08/2022  PCP: Georgina Quint, MD  Admit date: 05/08/2022 Discharge date: 05/13/2022  Admitted From: Home Discharge disposition: Home  Recommendations at discharge:  Stop alcohol Ensure compliance to medicines  Brief narrative: Wesley Williams is a 51 y.o. male with PMH significant for chronic alcoholism, liver cirrhosis, portal hypertension, esophageal varices s/p banding 05/2020, DM2, chronic thrombocytopenia who continues to drink alcohol. Patient was brought to the ED on 9/2 after episode of hematemesis leading to syncope. EMS noted blood pressure low at 80s, given fluid resuscitation, brought to ED In the ED, hemoglobin was low at 6.6, platelet low at 55 Given PRBCs, platelet infusion.  Started on Protonix drip as well as octreotide IV. Admitted to hospitalist service for severe variceal bleeding GI was consulted See below for details.  Subjective: Patient was seen and examined this morning.  Pleasant middle-aged Hispanic male.  Lying down in bed.  Not in distress.  Hemoglobin stable  Hospital course: Esophageal varices with bleeding Secondary to alcoholic cirrhosis with portal hypertension and ongoing alcohol use 9/3, underwent EGD, noted to have active hemorrhage from grade 3 esophageal varices, 4 bands placed. Completed the course of octreotide drip, Protonix and prophylactic Rocephin. GI following.  Repeat EGD in 4 weeks recommended. Currently on regular consistency diet.  Acute on chronic blood loss anemia Presented with a low hemoglobin of 6.6, due to severe variceal bleeding Received 6 units of PRBC so far, last transfusion was 2 days.  Hemoglobin stable.  Patient may still continue to pass melanotic stool for next 1 to 2 days as he will be clearing out the upper GI bleeding he had.  Hemoglobin stable.  Plan to discharge home today. Recent Labs    05/11/22 0804 05/11/22 1140  05/11/22 1558 05/11/22 1824 05/12/22 0730 05/12/22 1634 05/13/22 0744  HGB 8.4*   < > 8.7* 8.3* 8.9* 9.1* 8.8*  MCV 86.0  --   --   --   --   --  85.4   < > = values in this interval not displayed.   Chronic thrombocytopenia Chronically low platelets.  1 unit of platelet transfer was given on admission.  Continue to monitor.  Avoid heparin products. Recent Labs  Lab 05/08/22 1243 05/11/22 0804 05/13/22 0744  PLT 55* 45* 54*   Chronic alcoholism Continues to drink.  Extensively counseled to quit alcohol.   No withdrawal symptoms in the hospital  Alcoholic liver cirrhosis with portal hypertension Noted recommendation from GI to start on nadolol 20 mg daily after completion of octreotide drip. 9/4, RUQ ultrasound noted only small volume ascites.  Clinically euvolemic.  Hypotension Initially was hypotensive because severe bleeding. Blood pressure improved.  Currently in normal range.  Elevated liver enzymes Acute elevation in AST, ALT noted probably due to shock liver from hypotension. Improving trend.  Continue to monitor as an outpatient Recent Labs  Lab 05/08/22 1243 05/09/22 1856 05/10/22 1141 05/11/22 0804 05/13/22 0744  AST 52* 1,113* 538* 260* 61*  ALT 28 684* 556* 391* 211*  ALKPHOS 71 65 69 71 82  BILITOT 2.0* 4.3* 4.7* 7.2* 5.8*  BILIDIR  --   --  2.7*  --   --   IBILI  --   --  2.0*  --   --   PROT 5.1* 5.0* 5.2* 5.2* 5.5*  ALBUMIN 2.0* 2.2* 2.3* 2.2* 2.3*  INR 1.9*  --   --   --   --  PLT 55*  --   --  45* 54*   Type 2 diabetes mellitus with hyperglycemia A1c 7.1 on 05/08/2022 PTA supposed to be on metformin 500 mg twice daily but apparently was noncompliant Post discharge resume metformin. Recent Labs  Lab 05/12/22 1533 05/12/22 2024 05/13/22 0000 05/13/22 0616 05/13/22 0822  GLUCAP 126* 164* 203* 156* 151*    Wounds:  -    Discharge Exam:   Vitals:   05/12/22 0725 05/12/22 1300 05/12/22 1925 05/13/22 0758  BP: 131/88 127/86 (!) 116/99  127/83  Pulse:   78 71  Resp:   16   Temp: 98.3 F (36.8 C) 98 F (36.7 C) 98.3 F (36.8 C) 98.4 F (36.9 C)  TempSrc:   Oral Oral  SpO2:   98% 99%  Weight:      Height:        Body mass index is 33.35 kg/m.   General exam: Pleasant, middle-aged Hispanic male.  Not in physical distress Skin: No rashes, lesions or ulcers. HEENT: Atraumatic, normocephalic, no obvious bleeding Lungs: Clear to auscultation bilaterally CVS: Regular rate and rhythm, no murmur GI/Abd soft, nontender, nondistended, bowel sound present CNS: Alert, awake, oriented x3 Psychiatry: Mood appropriate Extremities: No pedal edema, no calf tenderness  Follow ups:    Follow-up Information     Georgina QuintSagardia, Miguel Roman, MD Follow up.   Specialty: Internal Medicine Contact information: [address] [place] [address] [phone]                 Discharge Instructions:   Discharge Instructions     Call MD for:  difficulty breathing, headache or visual disturbances   Complete by: As directed    Call MD for:  extreme fatigue   Complete by: As directed    Call MD for:  hives   Complete by: As directed    Call MD for:  persistant dizziness or light-headedness   Complete by: As directed    Call MD for:  persistant nausea and vomiting   Complete by: As directed    Call MD for:  severe uncontrolled pain   Complete by: As directed    Call MD for:  temperature >100.4   Complete by: As directed    Diet general   Complete by: As directed    Discharge instructions   Complete by: As directed    Recommendations at discharge:   Stop alcohol  Ensure compliance to medicines  General discharge instructions: Follow with Primary MD Georgina QuintSagardia, Miguel Amarien, MD in 7 days  Please request your PCP  to go over your hospital tests, procedures, radiology results at the follow up. Please get your medicines reviewed and adjusted.  Your PCP may decide to repeat certain labs or tests as needed. Do not drive,  operate heavy machinery, perform activities at heights, swimming or participation in water activities or provide baby sitting services if your were admitted for syncope or siezures until you have seen by Primary MD or a Neurologist and advised to do so again. [place] Controlled Substance Reporting System database was reviewed. Do not drive, operate heavy machinery, perform activities at heights, swim, participate in water activities or provide baby-sitting services while on medications for pain, sleep and mood until your outpatient physician has reevaluated you and advised to do so again.  You are strongly recommended to comply with the dose, frequency and duration of prescribed medications. Activity: As tolerated with Full fall precautions use walker/cane & assistance as needed Avoid using  any recreational substances like cigarette, tobacco, alcohol, or non-prescribed drug. If you experience worsening of your admission symptoms, develop shortness of breath, life threatening emergency, suicidal or homicidal thoughts you must seek medical attention immediately by calling 911 or calling your MD immediately  if symptoms less severe. You must read complete instructions/literature along with all the possible adverse reactions/side effects for all the medicines you take and that have been prescribed to you. Take any new medicine only after you have completely understood and accepted all the possible adverse reactions/side effects.  Wear Seat belts while driving. You were cared for by a hospitalist during your hospital stay. If you have any questions about your discharge medications or the care you received while you were in the hospital after you are discharged, you can call the unit and ask to speak with the hospitalist or the covering physician. Once you are discharged, your primary care physician will handle any further medical issues. Please note that NO REFILLS for any discharge medications will be  authorized once you are discharged, as it is imperative that you return to your primary care physician (or establish a relationship with a primary care physician if you do not have one).   Increase activity slowly   Complete by: As directed        Discharge Medications:   Allergies as of 05/13/2022   No Known Allergies      Medication List     TAKE these medications    metFORMIN 500 MG tablet Commonly known as: GLUCOPHAGE Take 1 tablet (500 mg total) by mouth 2 (two) times daily with a meal.   nadolol 20 MG tablet Commonly known as: CORGARD Take 1 tablet (20 mg total) by mouth daily.         The results of significant diagnostics from this hospitalization (including imaging, microbiology, ancillary and laboratory) are listed below for reference.    Procedures and Diagnostic Studies:   DG Chest 1 View  Result Date: 05/08/2022 CLINICAL DATA:  Loss of consciousness. EXAM: CHEST  1 VIEW COMPARISON:  February 24, 2021 FINDINGS: Study is limited due to low volume portable technique. The cardiomediastinal silhouette is unremarkable given technique. No pneumothorax. No nodules or masses. No focal infiltrates. No overt edema. IMPRESSION: The study is limited due to low volume portable technique. No acute abnormalities are identified. Electronically Signed   By: Gerome Sam III M.D.   On: 05/08/2022 13:40   CT Cervical Spine Wo Contrast  Result Date: 05/08/2022 CLINICAL DATA:  Trauma, syncope EXAM: CT CERVICAL SPINE WITHOUT CONTRAST TECHNIQUE: Multidetector CT imaging of the cervical spine was performed without intravenous contrast. Multiplanar CT image reconstructions were also generated. RADIATION DOSE REDUCTION: This exam was performed according to the departmental dose-optimization program which includes automated exposure control, adjustment of the mA and/or kV according to patient size and/or use of iterative reconstruction technique. COMPARISON:  None Available. FINDINGS:  Alignment: Alignment of posterior margins of the vertebral bodies is unremarkable. Skull base and vertebrae: No recent fracture is seen. A 7 mm low-density in the right side of body C2 vertebra without break in the cortical margins, possibly a benign process such as bone cyst. Soft tissues and spinal canal: There is no central spinal stenosis. Disc levels: There is no significant encroachment of neural foramina. Small anterior bony spurs are seen at C5-C6 and C6-C7 levels. Upper chest: Unremarkable. Other: Dense calcifications are seen at both common carotid bifurcations. IMPRESSION: No recent fracture is seen in cervical spine. There  is no spinal stenosis. There is no significant encroachment of neural foramina. The 7 mm low-density in the right side of body of C2 vertebra without break in the cortical margins suggesting possible benign process such as bone cyst. Extensive calcifications are noted at both common carotid bifurcations. Electronically Signed   By: Ernie Avena M.D.   On: 05/08/2022 13:21   CT Head Wo Contrast  Result Date: 05/08/2022 CLINICAL DATA:  Trauma, syncope EXAM: CT HEAD WITHOUT CONTRAST TECHNIQUE: Contiguous axial images were obtained from the base of the skull through the vertex without intravenous contrast. RADIATION DOSE REDUCTION: This exam was performed according to the departmental dose-optimization program which includes automated exposure control, adjustment of the mA and/or kV according to patient size and/or use of iterative reconstruction technique. COMPARISON:  None Available. FINDINGS: Brain: No acute intracranial findings are seen. There are no signs of bleeding within the cranium. Ventricles are nondilated. Cortical sulci are prominent. In image 14 of series 3, there is 7 mm dense calcification in the central portion of left temporal lobe. There is no significant adjacent edema or focal mass effect. Vascular: Scattered arterial calcifications are seen. Skull: No  fracture is seen. Sinuses/Orbits: There is mild mucosal thickening in ethmoid and maxillary sinuses. Defects are seen in the crowns of some other visualized maxillary and mandibular teeth suggesting significant dental disease. Other: None. IMPRESSION: No acute intracranial findings are seen.  Atrophy. There is 7 mm dense calcification in the central portion of left temporal lobe without significant adjacent edema or mass effect. This may suggest healed granuloma. If there are continued symptoms, follow-up MRI should be considered. Mild chronic sinusitis.  Significant dental disease. Electronically Signed   By: Ernie Avena M.D.   On: 05/08/2022 13:15     Labs:   Basic Metabolic Panel: Recent Labs  Lab 05/08/22 1243 05/09/22 1856 05/10/22 1141 05/11/22 0804 05/13/22 0744  NA 138 138 139 138 137  K 3.4* 4.4 3.8 4.2 3.4*  CL 110 115* 116* 115* 112*  CO2 15* 18* 18* 17* 20*  GLUCOSE 268* 247* 152* 144* 154*  BUN 8 24* 26* 21* 13  CREATININE 0.76 0.87 0.92 0.79 0.66  CALCIUM 6.8* 6.9* 7.2* 7.4* 7.8*  MG  --   --   --   --  1.5*  PHOS  --   --   --   --  3.3   GFR Estimated Creatinine Clearance: 112 mL/min (by C-G formula based on SCr of 0.66 mg/dL). Liver Function Tests: Recent Labs  Lab 05/08/22 1243 05/09/22 1856 05/10/22 1141 05/11/22 0804 05/13/22 0744  AST 52* 1,113* 538* 260* 61*  ALT 28 684* 556* 391* 211*  ALKPHOS 71 65 69 71 82  BILITOT 2.0* 4.3* 4.7* 7.2* 5.8*  PROT 5.1* 5.0* 5.2* 5.2* 5.5*  ALBUMIN 2.0* 2.2* 2.3* 2.2* 2.3*   No results for input(s): "LIPASE", "AMYLASE" in the last 168 hours. No results for input(s): "AMMONIA" in the last 168 hours. Coagulation profile Recent Labs  Lab 05/08/22 1243  INR 1.9*    CBC: Recent Labs  Lab 05/08/22 1243 05/08/22 2226 05/11/22 0804 05/11/22 1140 05/11/22 1558 05/11/22 1824 05/12/22 0730 05/12/22 1634 05/13/22 0744  WBC 4.8  --  4.7  --   --   --   --   --  4.4  NEUTROABS 3.9  --  3.4  --   --   --    --   --  2.9  HGB 6.6*   < >  8.4*   < > 8.7* 8.3* 8.9* 9.1* 8.8*  HCT 22.4*   < > 25.9*   < > 26.3* 25.4* 26.8* 27.2* 27.5*  MCV 83.3  --  86.0  --   --   --   --   --  85.4  PLT 55*  --  45*  --   --   --   --   --  54*   < > = values in this interval not displayed.   Cardiac Enzymes: No results for input(s): "CKTOTAL", "CKMB", "CKMBINDEX", "TROPONINI" in the last 168 hours. BNP: Invalid input(s): "POCBNP" CBG: Recent Labs  Lab 05/12/22 1533 05/12/22 2024 05/13/22 0000 05/13/22 0616 05/13/22 0822  GLUCAP 126* 164* 203* 156* 151*   D-Dimer No results for input(s): "DDIMER" in the last 72 hours. Hgb A1c No results for input(s): "HGBA1C" in the last 72 hours. Lipid Profile No results for input(s): "CHOL", "HDL", "LDLCALC", "TRIG", "CHOLHDL", "LDLDIRECT" in the last 72 hours. Thyroid function studies No results for input(s): "TSH", "T4TOTAL", "T3FREE", "THYROIDAB" in the last 72 hours.  Invalid input(s): "FREET3" Anemia work up No results for input(s): "VITAMINB12", "FOLATE", "FERRITIN", "TIBC", "IRON", "RETICCTPCT" in the last 72 hours. Microbiology No results found for this or any previous visit (from the past 240 hour(s)).  Time coordinating discharge: 35 minutes  Signed: Thana Ramp  [facility] 05/13/2022, 10:49 AM

## 2022-05-13 NOTE — Progress Notes (Signed)
Pt is being discharged. Discharge instructions were given to the pt. All questions were answered.

## 2022-05-14 ENCOUNTER — Telehealth: Payer: Self-pay | Admitting: *Deleted

## 2022-05-14 ENCOUNTER — Telehealth: Payer: Self-pay

## 2022-05-14 NOTE — Telephone Encounter (Signed)
Through Spanish Interpreter Pt was made aware of recommendations: Pt was scheduled for an Office Visit on  05/18/2022 at 2:10: Pt made aware: Address provided: Pt verbalized understanding with all questions answered.

## 2022-05-14 NOTE — Telephone Encounter (Signed)
-----   Message from Dianah Field, PA-C sent at 05/12/2022  1:52 PM EDT ----- Regarding: arrange office follow up Hi Viviann Spare  This patient needs to be seen in the office within the next month, if he cannot squeeze in with Dr. Concha Se then APP would be a good alternative.  Additionally he is going to need an EGD in about 4 to 6 weeks, if feasible, for surveillance of bleeding esophageal varices that were banded a few days ago.  So the follow-up indication would be cirrhosis of the liver, GI bleed, bleeding esophageal varices. Patient is still in the hospital but hopefully will be going home in the next couple of days.  Thank you, S

## 2022-05-14 NOTE — Telephone Encounter (Signed)
Transition Care Management Follow-up Telephone Call Date of discharge and from where: 05/13/2022 [facility] How have you been since you were released from the hospital? Patient states he is feeling ok. His is having back pain.  Any questions or concerns? No  Items Reviewed: Did the pt receive and understand the discharge instructions provided? Yes  Medications obtained and verified? Yes  Other? No  Any new allergies since your discharge? No  Dietary orders reviewed? No Do you have support at home? Yes   Home Care and Equipment/Supplies: Were home health services ordered? no If so, what is the name of the agency? N/a  Has the agency set up a time to come to the patient's home? not applicable Were any new equipment or medical supplies ordered?  No What is the name of the medical supply agency? N/a Were you able to get the supplies/equipment? not applicable Do you have any questions related to the use of the equipment or supplies? No  Functional Questionnaire: (I = Independent and D = Dependent) ADLs: I  Bathing/Dressing- I  Meal Prep- I  Eating- I  Maintaining continence- I  Transferring/Ambulation- I  Managing Meds- I  Follow up appointments reviewed:  PCP Hospital f/u appt confirmed? Yes  Scheduled to see Dr. Alvy Bimler on 05/19/2022 @ 1:20pm. Specialist Hospital f/u appt confirmed? No  Scheduled to see n/a on n/a @ n/a. Are transportation arrangements needed? No  If their condition worsens, is the pt aware to call PCP or go to the Emergency Dept.? Yes Was the patient provided with contact information for the PCP's office or ED? Yes Was to pt encouraged to call back with questions or concerns? Yes

## 2022-05-17 NOTE — H&P (View-Only) (Signed)
Wesley Williams 51 y.o. 02-17-1971 [id number]  Assessment & Plan:   Encounter Diagnoses  Name Primary?   Alcoholic cirrhosis of liver with ascites (HCC) Yes   Secondary esophageal varices with bleeding [facility])     He will be scheduled for repeat EGD and variceal banding on 06/14/22  May need HBV vaccine  After labs below have decided to start ferrous sulfate - will notify Labs as below to reassess. Results = CHILD'S C 10 POINTS  MELD 3.0 18 POINTS  Strict abstinence from EtOH Low Na diet - handout given + cirrhosis + alcoholic liver disease + varices all in Spanish - reviewed lifting and variceal bleeding risk in particular  Lab Results  Component Value Date   WBC 4.5 05/18/2022   HGB 9.8 (L) 05/18/2022   HCT 30.4 (L) 05/18/2022   MCV 83.0 05/18/2022   PLT 98.0 (L) 05/18/2022   Lab Results  Component Value Date   INR 1.4 (H) 05/18/2022   INR 1.9 (H) 05/08/2022   INR 1.5 (H) 02/24/2021     Chemistry      Component Value Date/Time   NA 136 05/18/2022 1523   NA 135 03/13/2020 1603   K 4.1 05/18/2022 1523   CL 106 05/18/2022 1523   CO2 23 05/18/2022 1523   BUN 13 05/18/2022 1523   BUN 12 03/13/2020 1603   CREATININE 0.49 05/18/2022 1523      Component Value Date/Time   CALCIUM 8.6 05/18/2022 1523   ALKPHOS 110 05/18/2022 1523   AST 38 (H) 05/18/2022 1523   ALT 63 (H) 05/18/2022 1523   BILITOT 5.2 (H) 05/18/2022 1523   BILITOT 3.7 (H) 03/13/2020 1603        Orders Placed This Encounter  Procedures   Procedural/ Surgical Case Request: ESOPHAGOGASTRODUODENOSCOPY (EGD) WITH PROPOFOL   CBC with Differential/Platelet   Comprehensive metabolic panel   Protime-INR   TSH   Hepatitis B surface antibody,qualitative   Hepatitis B surface antigen   Hepatitis C antibody   Hepatitis B core antibody, total   Ambulatory referral to Gastroenterology   Meds ordered this encounter  Medications   spironolactone (ALDACTONE) 50 MG tablet    Sig: Take 1 tablet (50 mg  total) by mouth daily.    Dispense:  30 tablet    Refill:  0   furosemide (LASIX) 20 MG tablet    Sig: Take 1 tablet (20 mg total) by mouth daily.    Dispense:  30 tablet    Refill:  0     Subjective:   Chief Complaint: f/u after admit for variceal bleed in cirrhosis   GI Problem Summary  EtOH Cirrhosis first seen [facility] 7/21 [facility] - iron def anemia, CT showing cirrhosis no ascites  EGD 7/21 Gr II esoph varices + stigmata - banded Colonoscopy 7/21 rectal varices, portal colopthay suspected, diverticulosis  Repeat surveillance banding 8/21 and 9/21  [facility] bleed admit 9/21 and EGD w/ banding Gr 3 varices RUQ [place] - cirrhosis, patent PV and flow to liver, gallstones, small ascites   HPI 51 year old man with alcoholic cirrhosis and bleeding esophageal varices status post recent hospitalization here for follow-up.  EGD by Dr. Tomasa Rand 05/09/2022 with grade 3 bleeding varices treated with banding. Hematemesis + epistaxis also. He is here w/ niece who helps w/ hx - patient speaks English though sometimes has translation help.   He reports increased abdominal girth/swelling since hospital dc. No more bleeding though possibly some slight melena still. No EtOH and claims  he will be abstinent (was drinking at admit) and was non-adherent w/ nadolol and metformin.     Wt Readings from Last 3 Encounters:  05/19/22 204 lb 6 oz (92.7 kg)  05/18/22 208 lb 2 oz (94.4 kg)  05/08/22 200 lb 6.4 oz (90.9 kg)    Right upper quadrant ultrasound 05/10/2022 IMPRESSION: 1. Cirrhotic hepatic morphology. Liver parenchyma is difficult to penetrate. Allowing for this, no focal lesion is seen. As clinically indicated, consider abdominal MRI, when patient is able to tolerate breath hold technique as an outpatient. 2. Small volume perihepatic ascites. 3. Gallstones. Upper normal gallbladder wall thickness is nonspecific in the setting of chronic liver disease and ascites. 4. No biliary dilatation.       Latest Ref Rng & Units 05/18/2022    3:23 PM 05/13/2022    7:44 AM 05/12/2022    4:34 PM  CBC  WBC 4.0 - 10.5 K/uL 4.5  4.4    Hemoglobin 13.0 - 17.0 g/dL 9.8  8.8  9.1   Hematocrit 39.0 - 52.0 % 30.4  27.5  27.2   Platelets 150.0 - 400.0 K/uL 98.0  54     Lab Results  Component Value Date   INR 1.4 (H) 05/18/2022   INR 1.9 (H) 05/08/2022   INR 1.5 (H) 02/24/2021     Chemistry      Component Value Date/Time   NA 136 05/18/2022 1523   NA 135 03/13/2020 1603   K 4.1 05/18/2022 1523   CL 106 05/18/2022 1523   CO2 23 05/18/2022 1523   BUN 13 05/18/2022 1523   BUN 12 03/13/2020 1603   CREATININE 0.49 05/18/2022 1523      Component Value Date/Time   CALCIUM 8.6 05/18/2022 1523   ALKPHOS 110 05/18/2022 1523   AST 38 (H) 05/18/2022 1523   ALT 63 (H) 05/18/2022 1523   BILITOT 5.2 (H) 05/18/2022 1523   BILITOT 3.7 (H) 03/13/2020 1603     He is immune to hepatitis A but not to hepatitis B 2021 labs No Known Allergies Current Meds  Medication Sig       metFORMIN (GLUCOPHAGE) 500 MG tablet Take 1 tablet (500 mg total) by mouth 2 (two) times daily with a meal.   nadolol (CORGARD) 20 MG tablet Take 1 tablet (20 mg total) by mouth daily.       Past Medical History:  Diagnosis Date   Alcohol abuse    Cirrhosis (HCC)    Hypertension    Portal hypertension (HCC)-colopathy    Rhabdomyolysis 04/23/2016   Secondary esophageal varices without bleeding [facility])    Past Surgical History:  Procedure Laterality Date   COLONOSCOPY WITH PROPOFOL N/A 03/19/2020   Procedure: COLONOSCOPY WITH PROPOFOL;  Surgeon: Iva Boop, MD;  Location: [facility] ENDOSCOPY;  Service: Endoscopy;  Laterality: N/A;   ESOPHAGEAL BANDING  03/19/2020   Procedure: ESOPHAGEAL BANDING;  Surgeon: Iva Boop, MD;  Location: [facility] ENDOSCOPY;  Service: Endoscopy;;   ESOPHAGEAL BANDING N/A 04/07/2020   Procedure: ESOPHAGEAL BANDING;  Surgeon: Iva Boop, MD;  Location: [facility] ENDOSCOPY;  Service: Endoscopy;  Laterality: N/A;    ESOPHAGEAL BANDING N/A 05/19/2020   Procedure: ESOPHAGEAL BANDING;  Surgeon: Lynann Bologna, MD;  Location: [facility] ENDOSCOPY;  Service: Endoscopy;  Laterality: N/A;   ESOPHAGEAL BANDING  05/09/2022   Procedure: ESOPHAGEAL BANDING;  Surgeon: [name], MD;  Location: [facility] ENDOSCOPY;  Service: Gastroenterology;;   ESOPHAGOGASTRODUODENOSCOPY (EGD) WITH PROPOFOL N/A 03/19/2020   Procedure: ESOPHAGOGASTRODUODENOSCOPY (EGD) WITH PROPOFOL;  Surgeon: Iva Boop, MD;  Location: [facility] ENDOSCOPY;  Service: Endoscopy;  Laterality: N/A;   ESOPHAGOGASTRODUODENOSCOPY (EGD) WITH PROPOFOL N/A 04/07/2020   Procedure: ESOPHAGOGASTRODUODENOSCOPY (EGD) WITH PROPOFOL;  Surgeon: Iva Boop, MD;  Location: [facility] ENDOSCOPY;  Service: Endoscopy;  Laterality: N/A;   ESOPHAGOGASTRODUODENOSCOPY (EGD) WITH PROPOFOL N/A 05/19/2020   Procedure: ESOPHAGOGASTRODUODENOSCOPY (EGD) WITH PROPOFOL;  Surgeon: Lynann Bologna, MD;  Location: [facility] ENDOSCOPY;  Service: Endoscopy;  Laterality: N/A;   ESOPHAGOGASTRODUODENOSCOPY (EGD) WITH PROPOFOL N/A 05/09/2022   Procedure: ESOPHAGOGASTRODUODENOSCOPY (EGD) WITH PROPOFOL;  Surgeon: [name], MD;  Location: [facility] ENDOSCOPY;  Service: Gastroenterology;  Laterality: N/A;   Social History   Social History Narrative   Single   Works for a tree [employer]   Alcohol abuse hx   Never smoker, no drugs   family history includes Diabetes in his mother.   Review of Systems As above  Objective:   Physical Exam @BP  110/80   Pulse 91   Ht 5\' 5"  (1.651 m)   Wt 208 lb 2 oz (94.4 kg)   BMI 34.63 kg/m @  General:  NAD Eyes:   icteric Lungs:  clear Heart::  S1S2 no rubs, murmurs or gallops Abdomen:  Obese, ? Ascites soft and NT Ext:   Trace bilateral LE edema Neuro   A and o x 3   Data Reviewed:  See HPI

## 2022-05-17 NOTE — Progress Notes (Unsigned)
Wesley Williams 51 y.o. 12-28-1970 [id number]  Assessment & Plan:   Encounter Diagnoses  Name Primary?   Alcoholic cirrhosis of liver with ascites (HCC) Yes   Secondary esophageal varices with bleeding [facility])     He will be scheduled for repeat EGD and variceal banding on 06/14/22  May need HBV vaccine  After labs below have decided to start ferrous sulfate - will notify Labs as below to reassess. Results = CHILD'S C 10 POINTS  MELD 3.0 18 POINTS  Strict abstinence from EtOH Low Na diet - handout given + cirrhosis + alcoholic liver disease + varices all in Spanish - reviewed lifting and variceal bleeding risk in particular  Lab Results  Component Value Date   WBC 4.5 05/18/2022   HGB 9.8 (L) 05/18/2022   HCT 30.4 (L) 05/18/2022   MCV 83.0 05/18/2022   PLT 98.0 (L) 05/18/2022   Lab Results  Component Value Date   INR 1.4 (H) 05/18/2022   INR 1.9 (H) 05/08/2022   INR 1.5 (H) 02/24/2021     Chemistry      Component Value Date/Time   NA 136 05/18/2022 1523   NA 135 03/13/2020 1603   K 4.1 05/18/2022 1523   CL 106 05/18/2022 1523   CO2 23 05/18/2022 1523   BUN 13 05/18/2022 1523   BUN 12 03/13/2020 1603   CREATININE 0.49 05/18/2022 1523      Component Value Date/Time   CALCIUM 8.6 05/18/2022 1523   ALKPHOS 110 05/18/2022 1523   AST 38 (H) 05/18/2022 1523   ALT 63 (H) 05/18/2022 1523   BILITOT 5.2 (H) 05/18/2022 1523   BILITOT 3.7 (H) 03/13/2020 1603        Orders Placed This Encounter  Procedures   Procedural/ Surgical Case Request: ESOPHAGOGASTRODUODENOSCOPY (EGD) WITH PROPOFOL   CBC with Differential/Platelet   Comprehensive metabolic panel   Protime-INR   TSH   Hepatitis B surface antibody,qualitative   Hepatitis B surface antigen   Hepatitis C antibody   Hepatitis B core antibody, total   Ambulatory referral to Gastroenterology   Meds ordered this encounter  Medications   spironolactone (ALDACTONE) 50 MG tablet    Sig: Take 1 tablet (50 mg  total) by mouth daily.    Dispense:  30 tablet    Refill:  0   furosemide (LASIX) 20 MG tablet    Sig: Take 1 tablet (20 mg total) by mouth daily.    Dispense:  30 tablet    Refill:  0     Subjective:   Chief Complaint: f/u after admit for variceal bleed in cirrhosis   GI Problem Summary  EtOH Cirrhosis first seen [facility] 7/21 [facility] - iron def anemia, CT showing cirrhosis no ascites  EGD 7/21 Gr II esoph varices + stigmata - banded Colonoscopy 7/21 rectal varices, portal colopthay suspected, diverticulosis  Repeat surveillance banding 8/21 and 9/21  [facility] bleed admit 9/21 and EGD w/ banding Gr 3 varices RUQ [place] - cirrhosis, patent PV and flow to liver, gallstones, small ascites   HPI 51 year old man with alcoholic cirrhosis and bleeding esophageal varices status post recent hospitalization here for follow-up.  EGD by Dr. Tomasa Rand 05/09/2022 with grade 3 bleeding varices treated with banding. Hematemesis + epistaxis also. He is here w/ niece who helps w/ hx - patient speaks English though sometimes has translation help.   He reports increased abdominal girth/swelling since hospital dc. No more bleeding though possibly some slight melena still. No EtOH and claims  he will be abstinent (was drinking at admit) and was non-adherent w/ nadolol and metformin.     Wt Readings from Last 3 Encounters:  05/19/22 204 lb 6 oz (92.7 kg)  05/18/22 208 lb 2 oz (94.4 kg)  05/08/22 200 lb 6.4 oz (90.9 kg)    Right upper quadrant ultrasound 05/10/2022 IMPRESSION: 1. Cirrhotic hepatic morphology. Liver parenchyma is difficult to penetrate. Allowing for this, no focal lesion is seen. As clinically indicated, consider abdominal MRI, when patient is able to tolerate breath hold technique as an outpatient. 2. Small volume perihepatic ascites. 3. Gallstones. Upper normal gallbladder wall thickness is nonspecific in the setting of chronic liver disease and ascites. 4. No biliary dilatation.       Latest Ref Rng & Units 05/18/2022    3:23 PM 05/13/2022    7:44 AM 05/12/2022    4:34 PM  CBC  WBC 4.0 - 10.5 K/uL 4.5  4.4    Hemoglobin 13.0 - 17.0 g/dL 9.8  8.8  9.1   Hematocrit 39.0 - 52.0 % 30.4  27.5  27.2   Platelets 150.0 - 400.0 K/uL 98.0  54     Lab Results  Component Value Date   INR 1.4 (H) 05/18/2022   INR 1.9 (H) 05/08/2022   INR 1.5 (H) 02/24/2021     Chemistry      Component Value Date/Time   NA 136 05/18/2022 1523   NA 135 03/13/2020 1603   K 4.1 05/18/2022 1523   CL 106 05/18/2022 1523   CO2 23 05/18/2022 1523   BUN 13 05/18/2022 1523   BUN 12 03/13/2020 1603   CREATININE 0.49 05/18/2022 1523      Component Value Date/Time   CALCIUM 8.6 05/18/2022 1523   ALKPHOS 110 05/18/2022 1523   AST 38 (H) 05/18/2022 1523   ALT 63 (H) 05/18/2022 1523   BILITOT 5.2 (H) 05/18/2022 1523   BILITOT 3.7 (H) 03/13/2020 1603     He is immune to hepatitis A but not to hepatitis B 2021 labs No Known Allergies Current Meds  Medication Sig       metFORMIN (GLUCOPHAGE) 500 MG tablet Take 1 tablet (500 mg total) by mouth 2 (two) times daily with a meal.   nadolol (CORGARD) 20 MG tablet Take 1 tablet (20 mg total) by mouth daily.       Past Medical History:  Diagnosis Date   Alcohol abuse    Cirrhosis (HCC)    Hypertension    Portal hypertension (HCC)-colopathy    Rhabdomyolysis 04/23/2016   Secondary esophageal varices without bleeding [facility])    Past Surgical History:  Procedure Laterality Date   COLONOSCOPY WITH PROPOFOL N/A 03/19/2020   Procedure: COLONOSCOPY WITH PROPOFOL;  Surgeon: Iva Boop, MD;  Location: [facility] ENDOSCOPY;  Service: Endoscopy;  Laterality: N/A;   ESOPHAGEAL BANDING  03/19/2020   Procedure: ESOPHAGEAL BANDING;  Surgeon: Iva Boop, MD;  Location: [facility] ENDOSCOPY;  Service: Endoscopy;;   ESOPHAGEAL BANDING N/A 04/07/2020   Procedure: ESOPHAGEAL BANDING;  Surgeon: Iva Boop, MD;  Location: [facility] ENDOSCOPY;  Service: Endoscopy;  Laterality: N/A;    ESOPHAGEAL BANDING N/A 05/19/2020   Procedure: ESOPHAGEAL BANDING;  Surgeon: Lynann Bologna, MD;  Location: [facility] ENDOSCOPY;  Service: Endoscopy;  Laterality: N/A;   ESOPHAGEAL BANDING  05/09/2022   Procedure: ESOPHAGEAL BANDING;  Surgeon: [name], MD;  Location: [facility] ENDOSCOPY;  Service: Gastroenterology;;   ESOPHAGOGASTRODUODENOSCOPY (EGD) WITH PROPOFOL N/A 03/19/2020   Procedure: ESOPHAGOGASTRODUODENOSCOPY (EGD) WITH PROPOFOL;  Surgeon: Iva Boop, MD;  Location: [facility] ENDOSCOPY;  Service: Endoscopy;  Laterality: N/A;   ESOPHAGOGASTRODUODENOSCOPY (EGD) WITH PROPOFOL N/A 04/07/2020   Procedure: ESOPHAGOGASTRODUODENOSCOPY (EGD) WITH PROPOFOL;  Surgeon: Iva Boop, MD;  Location: [facility] ENDOSCOPY;  Service: Endoscopy;  Laterality: N/A;   ESOPHAGOGASTRODUODENOSCOPY (EGD) WITH PROPOFOL N/A 05/19/2020   Procedure: ESOPHAGOGASTRODUODENOSCOPY (EGD) WITH PROPOFOL;  Surgeon: Lynann Bologna, MD;  Location: [facility] ENDOSCOPY;  Service: Endoscopy;  Laterality: N/A;   ESOPHAGOGASTRODUODENOSCOPY (EGD) WITH PROPOFOL N/A 05/09/2022   Procedure: ESOPHAGOGASTRODUODENOSCOPY (EGD) WITH PROPOFOL;  Surgeon: [name], MD;  Location: [facility] ENDOSCOPY;  Service: Gastroenterology;  Laterality: N/A;   Social History   Social History Narrative   Single   Works for a tree service   Alcohol abuse hx   Never smoker, no drugs   family history includes Diabetes in his mother.   Review of Systems As above  Objective:   Physical Exam @BP  110/80   Pulse 91   Ht 5\' 5"  (1.651 m)   Wt 208 lb 2 oz (94.4 kg)   BMI 34.63 kg/m @  General:  NAD Eyes:   icteric Lungs:  clear Heart::  S1S2 no rubs, murmurs or gallops Abdomen:  Obese, ? Ascites soft and NT Ext:   Trace bilateral LE edema Neuro   A and o x 3   Data Reviewed:  See HPI

## 2022-05-18 ENCOUNTER — Ambulatory Visit (INDEPENDENT_AMBULATORY_CARE_PROVIDER_SITE_OTHER): Payer: Self-pay | Admitting: Internal Medicine

## 2022-05-18 ENCOUNTER — Other Ambulatory Visit (INDEPENDENT_AMBULATORY_CARE_PROVIDER_SITE_OTHER): Payer: Self-pay

## 2022-05-18 ENCOUNTER — Encounter: Payer: Self-pay | Admitting: Internal Medicine

## 2022-05-18 VITALS — BP 110/80 | HR 91 | Ht 65.0 in | Wt 208.1 lb

## 2022-05-18 DIAGNOSIS — I8511 Secondary esophageal varices with bleeding: Secondary | ICD-10-CM

## 2022-05-18 DIAGNOSIS — K7031 Alcoholic cirrhosis of liver with ascites: Secondary | ICD-10-CM

## 2022-05-18 LAB — PROTIME-INR
INR: 1.4 ratio — ABNORMAL HIGH (ref 0.8–1.0)
Prothrombin Time: 14.6 s — ABNORMAL HIGH (ref 9.6–13.1)

## 2022-05-18 LAB — CBC WITH DIFFERENTIAL/PLATELET
Basophils Absolute: 0.1 10*3/uL (ref 0.0–0.1)
Basophils Relative: 1.6 % (ref 0.0–3.0)
Eosinophils Absolute: 0.1 10*3/uL (ref 0.0–0.7)
Eosinophils Relative: 3 % (ref 0.0–5.0)
HCT: 30.4 % — ABNORMAL LOW (ref 39.0–52.0)
Hemoglobin: 9.8 g/dL — ABNORMAL LOW (ref 13.0–17.0)
Lymphocytes Relative: 10.1 % — ABNORMAL LOW (ref 12.0–46.0)
Lymphs Abs: 0.5 10*3/uL — ABNORMAL LOW (ref 0.7–4.0)
MCHC: 32.1 g/dL (ref 30.0–36.0)
MCV: 83 fl (ref 78.0–100.0)
Monocytes Absolute: 0.5 10*3/uL (ref 0.1–1.0)
Monocytes Relative: 11 % (ref 3.0–12.0)
Neutro Abs: 3.4 10*3/uL (ref 1.4–7.7)
Neutrophils Relative %: 74.3 % (ref 43.0–77.0)
Platelets: 98 10*3/uL — ABNORMAL LOW (ref 150.0–400.0)
RBC: 3.67 Mil/uL — ABNORMAL LOW (ref 4.22–5.81)
RDW: 18.1 % — ABNORMAL HIGH (ref 11.5–15.5)
WBC: 4.5 10*3/uL (ref 4.0–10.5)

## 2022-05-18 LAB — COMPREHENSIVE METABOLIC PANEL
ALT: 63 U/L — ABNORMAL HIGH (ref 0–53)
AST: 38 U/L — ABNORMAL HIGH (ref 0–37)
Albumin: 2.9 g/dL — ABNORMAL LOW (ref 3.5–5.2)
Alkaline Phosphatase: 110 U/L (ref 39–117)
BUN: 13 mg/dL (ref 6–23)
CO2: 23 mEq/L (ref 19–32)
Calcium: 8.6 mg/dL (ref 8.4–10.5)
Chloride: 106 mEq/L (ref 96–112)
Creatinine, Ser: 0.49 mg/dL (ref 0.40–1.50)
GFR: 118.07 mL/min (ref >60.00)
Glucose, Bld: 130 mg/dL — ABNORMAL HIGH (ref 70–99)
Potassium: 4.1 mEq/L (ref 3.5–5.1)
Sodium: 136 mEq/L (ref 135–145)
Total Bilirubin: 5.2 mg/dL — ABNORMAL HIGH (ref 0.2–1.2)
Total Protein: 7 g/dL (ref 6.0–8.3)

## 2022-05-18 LAB — TSH: TSH: 3.47 u[IU]/mL (ref 0.35–5.50)

## 2022-05-18 MED ORDER — SPIRONOLACTONE 50 MG PO TABS: 50.0000 mg | ORAL_TABLET | Freq: Every day | ORAL | 0 refills | Status: DC

## 2022-05-18 MED ORDER — FUROSEMIDE 20 MG PO TABS: 20.0000 mg | ORAL_TABLET | Freq: Every day | ORAL | 0 refills | Status: DC

## 2022-05-18 NOTE — Patient Instructions (Signed)
Continue to avoid alcohol.  Follow a low sodium diet.   We have sent the following medications to your pharmacy for you to pick up at your convenience: Furosemide and spironolactone  You have been scheduled for an endoscopy. Please follow written instructions given to you at your visit today. If you use inhalers (even only as needed), please bring them with you on the day of your procedure.  Your provider has requested that you go to the basement level for lab work before leaving today. Press "B" on the elevator. The lab is located at the first door on the left as you exit the elevator.  Due to recent changes in healthcare laws, you may see the results of your imaging and laboratory studies on MyChart before your provider has had a chance to review them.  We understand that in some cases there may be results that are confusing or concerning to you. Not all laboratory results come back in the same time frame and the provider may be waiting for multiple results in order to interpret others.  Please give [place] 48 hours in order for your provider to thoroughly review all the results before contacting the office for clarification of your results.   I appreciate the opportunity to care for you. Stan Head, MD, [facility]

## 2022-05-19 ENCOUNTER — Encounter: Payer: Self-pay | Admitting: Emergency Medicine

## 2022-05-19 ENCOUNTER — Encounter: Payer: Self-pay | Admitting: Internal Medicine

## 2022-05-19 ENCOUNTER — Ambulatory Visit: Payer: Self-pay | Admitting: Emergency Medicine

## 2022-05-19 VITALS — BP 120/72 | HR 66 | Temp 98.4°F | Ht 65.0 in | Wt 204.4 lb

## 2022-05-19 DIAGNOSIS — Z23 Encounter for immunization: Secondary | ICD-10-CM

## 2022-05-19 DIAGNOSIS — K766 Portal hypertension: Secondary | ICD-10-CM

## 2022-05-19 DIAGNOSIS — K7031 Alcoholic cirrhosis of liver with ascites: Secondary | ICD-10-CM

## 2022-05-19 DIAGNOSIS — I8510 Secondary esophageal varices without bleeding: Secondary | ICD-10-CM

## 2022-05-19 DIAGNOSIS — D61818 Other pancytopenia: Secondary | ICD-10-CM

## 2022-05-19 DIAGNOSIS — Z09 Encounter for follow-up examination after completed treatment for conditions other than malignant neoplasm: Secondary | ICD-10-CM

## 2022-05-19 LAB — HEPATITIS B SURFACE ANTIGEN: Hepatitis B Surface Ag: NONREACTIVE

## 2022-05-19 LAB — HEPATITIS B CORE ANTIBODY, TOTAL: Hep B Core Total Ab: NONREACTIVE

## 2022-05-19 LAB — HEPATITIS C ANTIBODY: Hepatitis C Ab: NONREACTIVE

## 2022-05-19 LAB — HEPATITIS B SURFACE ANTIBODY,QUALITATIVE: Hep B S Ab: REACTIVE — AB

## 2022-05-19 NOTE — Assessment & Plan Note (Signed)
Presently stable.  No bleeding. Scheduled for upper endoscopy next month. Continue nadolol 20 mg daily.

## 2022-05-19 NOTE — Progress Notes (Signed)
Wesley Williams 51 y.o.   Chief Complaint  Patient presents with   Hospitalization Follow-up    No issues or concerns since being discharged     HISTORY OF PRESENT ILLNESS: This is a 51 y.o. male here for hospital discharge follow-up Was admitted with upper GI bleed.  Has history of alcoholic liver cirrhosis. Seen in follow-up by GI doctor yesterday. Stable today.  Has no complaints or any other medical concerns today. Discharge summary as follows: Physician Discharge [place] [id number] DOB: [birth year] DOA: 05/08/2022   PCP: Horald Pollen, MD   Admit date: 05/08/2022 Discharge date: 05/13/2022   Admitted From: Home Discharge disposition: Home   Recommendations at discharge:  Stop alcohol Ensure compliance to medicines   Brief narrative: Wesley Williams is a 51 y.o. male with PMH significant for chronic alcoholism, liver cirrhosis, portal hypertension, esophageal varices s/p banding 05/2020, DM2, chronic thrombocytopenia who continues to drink alcohol. Patient was brought to the ED on 9/2 after episode of hematemesis leading to syncope. EMS noted blood pressure low at 80s, given fluid resuscitation, brought to ED In the ED, hemoglobin was low at 6.6, platelet low at 55 Given PRBCs, platelet infusion.  Started on Protonix drip as well as octreotide IV. Admitted to hospitalist service for severe variceal bleeding GI was consulted See below for details.   Subjective: Patient was seen and examined this morning.  Pleasant middle-aged Hispanic male.  Lying down in bed.  Not in distress.  Hemoglobin stable   Hospital course: Esophageal varices with bleeding Secondary to alcoholic cirrhosis with portal hypertension and ongoing alcohol use 9/3, underwent EGD, noted to have active hemorrhage from grade 3 esophageal varices, 4 bands placed. Completed the course of octreotide drip, Protonix and prophylactic Rocephin. GI following.  Repeat EGD in 4 weeks  recommended. Currently on regular consistency diet.   Acute on chronic blood loss anemia Presented with a low hemoglobin of 6.6, due to severe variceal bleeding Received 6 units of PRBC so far, last transfusion was 2 days.  Hemoglobin stable.  Patient may still continue to pass melanotic stool for next 1 to 2 days as he will be clearing out the upper GI bleeding he had.  Hemoglobin stable.  Plan to discharge home today.Chronic thrombocytopenia Chronically low platelets.  1 unit of platelet transfer was given on admission.  Continue to monitor.  Avoid heparin products.      Recent Labs  Lab 05/08/22 1243 05/11/22 0804 05/13/22 0744  PLT 55* 45* 54*    Chronic alcoholism Continues to drink.  Extensively counseled to quit alcohol.   No withdrawal symptoms in the hospital   Alcoholic liver cirrhosis with portal hypertension Noted recommendation from GI to start on nadolol 20 mg daily after completion of octreotide drip. 9/4, RUQ ultrasound noted only small volume ascites.  Clinically euvolemic.   Hypotension Initially was hypotensive because severe bleeding. Blood pressure improved.  Currently in normal range.   Elevated liver enzymes Acute elevation in AST, ALT noted probably due to shock liver from hypotension. Improving trend.  Continue to monitor as an outpatientType 2 diabetes mellitus with hyperglycemia A1c 7.1 on 05/08/2022 PTA supposed to be on metformin 500 mg twice daily but apparently was noncompliant Post discharge resume metformin.  GI office visit note from yesterday, assessment and plan as follows: Assessment & Plan:      Abstiain Low na Varices info + Na and cirrhosis alcoholic liver disease   HPI   Prior to  Admission medications   Medication Sig Start Date End Date Taking? Authorizing Provider  furosemide (LASIX) 20 MG tablet Take 1 tablet (20 mg total) by mouth daily. 05/18/22  Yes [name], MD  metFORMIN (GLUCOPHAGE) 500 MG tablet Take 1 tablet (500  mg total) by mouth 2 (two) times daily with a meal. 05/13/22 08/11/22 Yes Dahal, Marlowe Aschoff, MD  nadolol (CORGARD) 20 MG tablet Take 1 tablet (20 mg total) by mouth daily. 05/13/22 08/11/22 Yes Dahal, Marlowe Aschoff, MD  spironolactone (ALDACTONE) 50 MG tablet Take 1 tablet (50 mg total) by mouth daily. 05/18/22  Yes [name], MD    No Known Allergies  Patient Active Problem List   Diagnosis Date Noted   Hematemesis    Symptomatic anemia 05/08/2022   Esophageal varices with bleeding ([place]) 05/08/2022   Hypotension 05/08/2022   Type 2 diabetes mellitus with hyperglycemia, without long-term current use of insulin ([place]) 12/28/2021   Hyperglycemia 06/29/2021   Secondary esophageal varices without bleeding ([place])    Rectal varices    Portal hypertension (HCC)-colopathy    Alcoholic cirrhosis of liver without ascites ([place]) 03/18/2020   Pancytopenia ([place])    Anemia 12/12/2017   Erectile dysfunction 12/12/2017   Alcohol abuse 04/23/2016    Past Medical History:  Diagnosis Date   Alcohol abuse    Cirrhosis ([place])    Hypertension    Portal hypertension (HCC)-colopathy    Rhabdomyolysis 04/23/2016   Secondary esophageal varices without bleeding (HCC)     Past Surgical History:  Procedure Laterality Date   COLONOSCOPY WITH PROPOFOL N/A 03/19/2020   Procedure: COLONOSCOPY WITH PROPOFOL;  Surgeon: [name], MD;  Location[facility] ENDOSCOPY;  Service: Endoscopy;  Laterality: N/A;   ESOPHAGEAL BANDING  03/19/2020   Procedure: ESOPHAGEAL BANDING;  Surgeon: [name], MD;  Location: [place];  Service: Endoscopy;;   ESOPHAGEAL BANDING N/A 04/07/2020   Procedure: ESOPHAGEAL BANDING;  Surgeon: [name], MD;  Location: [facility] ENDOSCOPY;  Service: Endoscopy;  Laterality: N/A;   ESOPHAGEAL BANDING N/A 05/19/2020   Procedure: ESOPHAGEAL BANDING;  Surgeon: [name], MD;  Location: [facility] ENDOSCOPY;  Service: Endoscopy;  Laterality: N/A;   ESOPHAGEAL BANDING  05/09/2022   Procedure: ESOPHAGEAL BANDING;   Surgeon: Daryel November, MD;  Location: [facility] ENDOSCOPY;  Service: Gastroenterology;;   ESOPHAGOGASTRODUODENOSCOPY (EGD) WITH PROPOFOL N/A 03/19/2020   Procedure: ESOPHAGOGASTRODUODENOSCOPY (EGD) WITH PROPOFOL;  Surgeon: [name], MD;  Location: [place];  Service: Endoscopy;  Laterality: N/A;   ESOPHAGOGASTRODUODENOSCOPY (EGD) WITH PROPOFOL N/A 04/07/2020   Procedure: ESOPHAGOGASTRODUODENOSCOPY (EGD) WITH PROPOFOL;  Surgeon: [name], MD;  Location: [facility] ENDOSCOPY;  Service: Endoscopy;  Laterality: N/A;   ESOPHAGOGASTRODUODENOSCOPY (EGD) WITH PROPOFOL N/A 05/19/2020   Procedure: ESOPHAGOGASTRODUODENOSCOPY (EGD) WITH PROPOFOL;  Surgeon: [name], MD;  Location: [facility] ENDOSCOPY;  Service: Endoscopy;  Laterality: N/A;   ESOPHAGOGASTRODUODENOSCOPY (EGD) WITH PROPOFOL N/A 05/09/2022   Procedure: ESOPHAGOGASTRODUODENOSCOPY (EGD) WITH PROPOFOL;  Surgeon: Daryel November, MD;  Location: [facility];  Service: Gastroenterology;  Laterality: N/A;    Social History   Socioeconomic History   Marital status: Single    Spouse name: Not on file   Number of children: Not on file   Years of education: Not on file   Highest education level: Not on file  Occupational History   Not on file  Tobacco Use   Smoking status: Never   Smokeless tobacco: Never  Vaping Use   Vaping Use: Never used  Substance and Sexual Activity  Alcohol use: Not Currently    Comment: former etoh abuse, none since july 2021.   Drug use: No   Sexual activity: Yes  Other Topics Concern   Not on file  Social History Narrative   Single   Works for a tree service   Alcohol abuse hx   Never smoker, no drugs   Social Determinants of Health   Financial Resource Strain: Not on file  Food Insecurity: No Food Insecurity (05/12/2022)   Hunger Vital Sign    Worried About Running Out of Food in the Last Year: Never true    Ran Out of Food in the Last Year: Never true  Transportation Needs: No Transportation  Needs (05/12/2022)   PRAPARE - Administrator, Civil Service (Medical): No    Lack of Transportation (Non-Medical): No  Physical Activity: Not on file  Stress: Not on file  Social Connections: Not on file  Intimate Partner Violence: Not At Risk (05/12/2022)   Humiliation, Afraid, Rape, and Kick questionnaire    Fear of Current or Ex-Partner: No    Emotionally Abused: No    Physically Abused: No    Sexually Abused: No    Family History  Problem Relation Age of Onset   Diabetes Mother      Review of Systems  Constitutional: Negative.  Negative for chills and fever.  HENT: Negative.  Negative for congestion and sore throat.   Respiratory: Negative.  Negative for cough and shortness of breath.   Cardiovascular: Negative.  Negative for chest pain and palpitations.  Gastrointestinal:  Negative for abdominal pain, diarrhea, nausea and vomiting.  Genitourinary: Negative.   Musculoskeletal: Negative.   Skin: Negative.  Negative for rash.  Neurological: Negative.  Negative for dizziness and headaches.  All other systems reviewed and are negative.  Today's Vitals   05/19/22 1324  BP: 120/72  Pulse: 66  Temp: 98.4 F (36.9 C)  TempSrc: Oral  SpO2: 97%  Weight: 204 lb 6 oz (92.7 kg)  Height: 5\' 5"  (1.651 m)   Body mass index is 34.01 kg/m.   Physical Exam Vitals reviewed.  Constitutional:      Appearance: Normal appearance.  HENT:     Head: Normocephalic.     Mouth/Throat:     Mouth: Mucous membranes are moist.     Pharynx: Oropharynx is clear.  Eyes:     Extraocular Movements: Extraocular movements intact.     Pupils: Pupils are equal, round, and reactive to light.  Cardiovascular:     Rate and Rhythm: Normal rate and regular rhythm.     Pulses: Normal pulses.     Heart sounds: Normal heart sounds.  Abdominal:     Palpations: Abdomen is soft.     Tenderness: There is no abdominal tenderness.     Comments: Significant ascites  Musculoskeletal:      Cervical back: No tenderness.     Right lower leg: Edema present.     Left lower leg: Edema present.  Lymphadenopathy:     Cervical: No cervical adenopathy.  Skin:    Capillary Refill: Capillary refill takes less than 2 seconds.     Coloration: Skin is jaundiced.  Neurological:     General: No focal deficit present.     Mental Status: He is alert and oriented to person, place, and time.     Comments: No signs of hepatic encephalopathy  Psychiatric:        Mood and Affect: Mood normal.  Behavior: Behavior normal.      ASSESSMENT & PLAN: A total of 47 minutes was spent with the patient and counseling/coordination of care regarding preparing for this visit, review of most recent office visit notes, review of most recent hospitalist discharge summary, review of most recent blood work results, review of most recent GI doctors office visit notes, review of multiple chronic medical problems under management, review of all medications, education on nutrition, prognosis, documentation, and need for follow-up.  Problem List Items Addressed This Visit       Cardiovascular and Mediastinum   Secondary esophageal varices without bleeding (HCC)    Presently stable.  No bleeding. Scheduled for upper endoscopy next month. Continue nadolol 20 mg daily.      Portal hypertension (HCC)-colopathy    With significant ascites on physical exam. Just started on Aldactone 50 mg daily.        Digestive   Alcoholic cirrhosis of liver without ascites (HCC)    Significant ascites on physical examination. Was started on Aldactone 50 mg daily. Stable vital signs.  No signs of hepatic encephalopathy. No signs of spontaneous bacterial peritonitis. No signs of GI bleeding. Stable.  Advised to completely stay away from alcohol intake. Education on nutrition given.        Hematopoietic and Hemostatic   Pancytopenia [facility])   Other Visit Diagnoses     Hospital discharge follow-up    -  Primary    Need for vaccination       Relevant Orders   Flu Vaccine QUAD 6+ mos PF IM (Fluarix Quad PF) (Completed)      Patient Instructions  Cirrosis Cirrhosis  La cirrosis es una lesin de largo plazo (crnica) en el hgado. El hgado es el rgano interno ms grande del cuerpo, y cumple muchas funciones. Este rgano transforma los [employer], elimina las sustancias txicas de la [place], [place] protenas importantes y absorbe las vitaminas necesarias de los alimentos. En la cirrosis, las clulas hepticas son reemplazadas por tejido cicatricial. Esto impide que la sangre circule por el hgado y dificulta que el hgado realice sus funciones. Cules son las causas? Las causas ms comunes de la cirrosis son la hepatitis C y el consumo prolongado de alcohol. Otras causas son las siguientes: Enfermedad por hgado graso no alcohlico [facility]). Esto sucede cuando la grasa se deposita en el hgado por otras causas que no sean el alcohol. Infeccin por hepatitis B. Hepatitis autoinmune. En esta enfermedad, el sistema de defensa del cuerpo (sistema inmunitario) ataca por error las clulas del hgado, lo que causa inflamacin. Enfermedades que [employer] conductos dentro del hgado. Enfermedades hepticas hereditarias, como la hemocromatosis. Esta es una de las enfermedades hepticas hereditarias ms comunes. En esta enfermedad, depsitos de hierro se acumulan en el hgado y otros rganos. Reacciones a determinados medicamentos a Air cabin crew, como la [place], un medicamento para Insurance underwriter. Infecciones por parsitos. Estas incluyen la esquistosomiasis, que es una enfermedad causada por un platelminto. Contacto prolongado con ciertas sustancias txicas. Estas sustancias txicas incluyen ciertos solventes orgnicos, tales como cloroformo y tolueno. Qu incrementa el riesgo? Es ms probable que sufra esta afeccin si: Tiene ciertos tipos de hepatitis viral. Consume alcohol en exceso,  especialmente si es mujer. Tiene sobrepeso. [place] drogas intravenosas y comparte agujas. Tiene relaciones sexuales sin usar proteccin con alguien que tiene una hepatitis viral. Cules son los signos o sntomas? Es posible que no tenga signos ni sntomas al principio. Puede que los sntomas no se Human resources officer  que el dao heptico empiece a empeorar. Entre los primeros sntomas, se pueden incluir los siguientes: Debilidad y International aid/development worker (fatiga). Cambios en los patrones de sueo o dificultad para dormir. Picazn. Dolor a Doctor, hospital parte superior derecha del abdomen. Adelgazamiento y prdida de masa muscular. Nuseas. Prdida del apetito. Entre los sntomas que aparecen en una etapa posterior se pueden incluir los siguientes: Fatiga o debilidad que [place]. Piel y ojos amarillos (ictericia). Acumulacin de lquido en el abdomen (ascitis). Puede notar que la ropa se le aprieta alrededor de la cintura. Aumento de peso e hinchazn de los pies y los tobillos (edema). Dificultad para respirar. Tener hematomas o hemorragias. Vomitar sangre o tener heces negras o con sangre. Confusin mental. Cmo se diagnostica? El mdico puede sospechar la presencia de cirrosis en funcin de los sntomas y la historia clnica, en especial si tiene otras enfermedades o antecedentes de consumo de alcohol. El [employer] har un examen fsico para palparle el hgado y buscar signos de cirrosis. Las pruebas pueden incluir las siguientes: Anlisis de sangre para Chief Technology Officer lo siguiente: Si tiene hepatitis B o C. La funcin renal. La funcin heptica. Pruebas de diagnstico por imgenes, por ejemplo: Resonancia magntica (RM) o exploracin por tomografa computarizada (TC) para buscar los cambios que se observan en los casos de cirrosis [place]. Ecografa para determinar si el tejido heptico normal est siendo reemplazado por tejido cicatricial. Un procedimiento en el que se [place] una aguja larga para  tomar Truddie Coco de tejido heptico para estudiarlo en un laboratorio (biopsia). La biopsia de hgado puede confirmar el diagnstico de cirrosis. Cmo se trata? El tratamiento de esta afeccin depende de la magnitud del dao heptico y qu lo caus. Puede incluir el tratamiento de los sntomas de la cirrosis o de las causas subyacentes, a fin de Lexicographer el avance del [place]. El tratamiento puede incluir: Cambios de [place] de vida, como: Seguir una dieta saludable. Puede tener que consultar al mdico o a un nutricionista para Animal nutritionist de alimentacin. Restringir la ingesta de sal. Mantener un peso saludable. No consumir drogas o alcohol. Tomar medicamentos para: Risk manager las infecciones del hgado u otras infecciones. Controlar la picazn. Disminuir la acumulacin de lquido. Reducir ciertas sustancias txicas de la sangre. Reducir el riesgo de hemorragia de los vasos sanguneos agrandados en el estmago o el esfago (vrices). Trasplante de hgado. En este procedimiento, el hgado de un donante se [place] para reemplazar el hgado enfermo. Esto se realiza si la cirrosis ha ocasionado insuficiencia heptica. Otros tratamientos y procedimientos, segn los problemas que usted tenga debido a la cirrosis. Los problemas frecuentes incluyen insuficiencia renal relacionada con el hgado (sndrome hepatorenal). Siga estas instrucciones en su casa:  Tome los medicamentos solamente como se lo haya indicado el mdico. No use medicamentos que sean txicos para el hgado. Consulte al mdico antes de tomar algn [employer], incluidos los de venta libre, [place] los antiinflamatorios no esteroideos [place]). Descanse todo lo que sea necesario. Siga una alimentacin bien equilibrada. Limite el consumo de sal o de agua, si el mdico le indica que lo haga. No beba alcohol. Esto es especialmente importante si toma paracetamol de manera habitual. Cumpla con todas las visitas de seguimiento. Esto es  importante. Comunquese con un mdico si: Tiene fatiga o debilidad que empeora. Se le hinchan las manos, los pies o las piernas, o tiene acumulacin de lquido en el abdomen (ascitis). Tiene fiebre o escalofros. Pierde el apetito. Siente nuseas o vmitos. Tiene ictericia. Se  le forman hematomas o sangra con facilidad. Solicite ayuda de inmediato si: Vomita sangre de color rojo brillante o una sustancia parecida a los granos de caf. Tiene [employer]. Nota que las heces son de color negro y de aspecto alquitranado. Comienza a sentirse confundido. Tiene dolor en el pecho o dificultad para respirar. Estos sntomas pueden representar un problema grave que constituye Engineer, maintenance (IT). No espere a ver si los sntomas desaparecen. Solicite atencin mdica de inmediato. Comunquese con el servicio de emergencias de su localidad (911 en los [place]). No conduzca por sus propios medios [employer] Financial. Resumen La cirrosis es una lesin crnica en el hgado. Las causas ms comunes son la hepatitis C y el consumo prolongado de alcohol. Las pruebas para diagnosticar la cirrosis incluyen anlisis de [place], estudios de diagnstico por imgenes y biopsia del hgado. El tratamiento de la afeccin incluye el tratamiento de la afeccin preexistente. Evite el consumo de alcohol, drogas, sal y los medicamentos que pueden daar el hgado. Busque ayuda de inmediato si vomita sangre de color rojo brillante o una sustancia parecida a los granos de caf. Esta informacin no tiene Marine scientist el consejo del mdico. Asegrese de hacerle al mdico cualquier pregunta que tenga. Document Revised: 06/23/2020 Document Reviewed: 06/23/2020 Elsevier Patient Education  Scott, MD [facility]

## 2022-05-19 NOTE — Patient Instructions (Signed)
Cirrosis Cirrhosis  La cirrosis es una lesin de largo plazo (crnica) en el hgado. El hgado es el rgano interno ms grande del cuerpo, y cumple muchas funciones. Este rgano transforma los [employer], elimina las sustancias txicas de la [place] protenas importantes y absorbe las vitaminas necesarias de los alimentos. En la cirrosis, las clulas hepticas son reemplazadas por tejido cicatricial. Esto impide que la sangre circule por el hgado y dificulta que el hgado realice sus funciones. Cules son las causas? Las causas ms comunes de la cirrosis son la hepatitis C y el consumo prolongado de alcohol. Otras causas son las siguientes: Enfermedad por hgado graso no alcohlico [facility]). Esto sucede cuando la grasa se deposita en el hgado por otras causas que no sean el alcohol. Infeccin por hepatitis B. Hepatitis autoinmune. En esta enfermedad, el sistema de defensa del cuerpo (sistema inmunitario) ataca por error las clulas del hgado, lo que causa inflamacin. Enfermedades que [employer] conductos dentro del hgado. Enfermedades hepticas hereditarias, como la hemocromatosis. Esta es una de las enfermedades hepticas hereditarias ms comunes. En esta enfermedad, depsitos de hierro se acumulan en el hgado y otros rganos. Reacciones a determinados medicamentos a Barrister's clerk, como la [place], un medicamento para Film/video editor. Infecciones por parsitos. Estas incluyen la esquistosomiasis, que es una enfermedad causada por un platelminto. Contacto prolongado con ciertas sustancias txicas. Estas sustancias txicas incluyen ciertos solventes orgnicos, tales como cloroformo y tolueno. Qu incrementa el riesgo? Es ms probable que sufra esta afeccin si: Tiene ciertos tipos de hepatitis viral. Consume alcohol en exceso, especialmente si es mujer. Tiene sobrepeso. [place] drogas intravenosas y comparte agujas. Tiene relaciones sexuales sin usar proteccin con alguien que  tiene una hepatitis viral. Cules son los signos o sntomas? Es posible que no tenga signos ni sntomas al principio. Puede que los sntomas no se manifiesten hasta que el dao heptico empiece a Copy. Entre los primeros sntomas, se pueden incluir los siguientes: Debilidad y International aid/development worker (fatiga). Cambios en los patrones de sueo o dificultad para dormir. Picazn. Dolor a Doctor, hospital parte superior derecha del abdomen. Adelgazamiento y prdida de masa muscular. Nuseas. Prdida del apetito. Entre los sntomas que aparecen en una etapa posterior se pueden incluir los siguientes: Fatiga o debilidad que [place]. Piel y ojos amarillos (ictericia). Acumulacin de lquido en el abdomen (ascitis). Puede notar que la ropa se le aprieta alrededor de la cintura. Aumento de peso e hinchazn de los pies y los tobillos (edema). Dificultad para respirar. Tener hematomas o hemorragias. Vomitar sangre o tener heces negras o con sangre. Confusin mental. Cmo se diagnostica? El mdico puede sospechar la presencia de cirrosis en funcin de los sntomas y la historia clnica, en especial si tiene otras enfermedades o antecedentes de consumo de alcohol. El [employer] har un examen fsico para palparle el hgado y buscar signos de cirrosis. Las pruebas pueden incluir las siguientes: Anlisis de sangre para Chief Technology Officer lo siguiente: Si tiene hepatitis B o C. La funcin renal. La funcin heptica. Pruebas de diagnstico por imgenes, por ejemplo: Resonancia magntica (RM) o exploracin por tomografa computarizada (TC) para buscar los cambios que se observan en los casos de cirrosis [place]. Ecografa para determinar si el tejido heptico normal est siendo reemplazado por tejido cicatricial. Un procedimiento en el que se [place] una aguja larga para tomar Truddie Coco de tejido heptico para estudiarlo en un laboratorio (biopsia). La biopsia de hgado puede confirmar el diagnstico de cirrosis. Cmo se  trata? [place] de  esta afeccin depende de la magnitud del dao heptico y qu lo caus. Puede incluir el tratamiento de los sntomas de la cirrosis o de las causas subyacentes, a fin de retrasar el avance del dao. El tratamiento puede incluir: Cambios de estilo de vida, como: Seguir una dieta saludable. Puede tener que consultar al mdico o a un nutricionista para elaborar un plan de alimentacin. Restringir la ingesta de sal. Mantener un peso saludable. No consumir drogas o alcohol. Tomar medicamentos para: Tratar las infecciones del hgado u otras infecciones. Controlar la picazn. Disminuir la acumulacin de lquido. Reducir ciertas sustancias txicas de la sangre. Reducir el riesgo de hemorragia de los vasos sanguneos agrandados en el estmago o el esfago (vrices). Trasplante de hgado. En este procedimiento, el hgado de un donante se utiliza para reemplazar el hgado enfermo. Esto se realiza si la cirrosis ha ocasionado insuficiencia heptica. Otros tratamientos y procedimientos, segn los problemas que usted tenga debido a la cirrosis. Los problemas frecuentes incluyen insuficiencia renal relacionada con el hgado (sndrome hepatorenal). Siga estas instrucciones en su casa:  Tome los medicamentos solamente como se lo haya indicado el mdico. No use medicamentos que sean txicos para el hgado. Consulte al mdico antes de tomar algn medicamento nuevo, incluidos los de venta libre, como los antiinflamatorios no esteroideos (AINE). Descanse todo lo que sea necesario. Siga una alimentacin bien equilibrada. Limite el consumo de sal o de agua, si el mdico le indica que lo haga. No beba alcohol. Esto es especialmente importante si toma paracetamol de manera habitual. Cumpla con todas las visitas de seguimiento. Esto es importante. Comunquese con un mdico si: Tiene fatiga o debilidad que empeora. Se le hinchan las manos, los pies o las piernas, o tiene acumulacin de lquido  en el abdomen (ascitis). Tiene fiebre o escalofros. Pierde el apetito. Siente nuseas o vmitos. Tiene ictericia. Se le forman hematomas o sangra con facilidad. Solicite ayuda de inmediato si: Vomita sangre de color rojo brillante o una sustancia parecida a los granos de caf. Tiene sangre en las heces. Nota que las heces son de color negro y de aspecto alquitranado. Comienza a sentirse confundido. Tiene dolor en el pecho o dificultad para respirar. Estos sntomas pueden representar un problema grave que constituye una emergencia. No espere a ver si los sntomas desaparecen. Solicite atencin mdica de inmediato. Comunquese con el servicio de emergencias de su localidad (911 en los [place]). No conduzca por sus propios medios hasta el hospital. Resumen La cirrosis es una lesin crnica en el hgado. Las causas ms comunes son la hepatitis C y el consumo prolongado de alcohol. Las pruebas para diagnosticar la cirrosis incluyen anlisis de sangre, estudios de diagnstico por imgenes y biopsia del hgado. El tratamiento de la afeccin incluye el tratamiento de la afeccin preexistente. Evite el consumo de alcohol, drogas, sal y los medicamentos que pueden daar el hgado. Busque ayuda de inmediato si vomita sangre de color rojo brillante o una sustancia parecida a los granos de caf. Esta informacin no tiene como fin reemplazar el consejo del mdico. Asegrese de hacerle al mdico cualquier pregunta que tenga. Document Revised: 06/23/2020 Document Reviewed: 06/23/2020 Elsevier Patient Education  2023 Elsevier Inc.  

## 2022-05-19 NOTE — Assessment & Plan Note (Signed)
With significant ascites on physical exam. Just started on Aldactone 50 mg daily.

## 2022-05-19 NOTE — Assessment & Plan Note (Addendum)
Significant ascites on physical examination. Was started on Aldactone 50 mg daily. Stable vital signs.  No signs of hepatic encephalopathy. No signs of spontaneous bacterial peritonitis. No signs of GI bleeding. Stable.  Advised to completely stay away from alcohol intake. Education on nutrition given.

## 2022-06-04 ENCOUNTER — Encounter (HOSPITAL_COMMUNITY): Payer: Self-pay | Admitting: Internal Medicine

## 2022-06-07 ENCOUNTER — Encounter (HOSPITAL_COMMUNITY): Payer: Self-pay | Admitting: Internal Medicine

## 2022-06-14 ENCOUNTER — Encounter (HOSPITAL_COMMUNITY): Payer: Self-pay | Admitting: Internal Medicine

## 2022-06-14 ENCOUNTER — Ambulatory Visit (HOSPITAL_COMMUNITY)
Admission: RE | Admit: 2022-06-14 | Discharge: 2022-06-14 | Disposition: A | Discharge status: Home / Self Care | Payer: Self-pay | Attending: Internal Medicine | Admitting: Internal Medicine

## 2022-06-14 ENCOUNTER — Ambulatory Visit (HOSPITAL_COMMUNITY): Payer: Self-pay | Admitting: Certified Registered Nurse Anesthetist

## 2022-06-14 ENCOUNTER — Encounter (HOSPITAL_COMMUNITY)
Admission: RE | Disposition: A | Discharge status: Home / Self Care | Payer: Self-pay | Source: Home / Self Care | Attending: Internal Medicine

## 2022-06-14 ENCOUNTER — Other Ambulatory Visit: Payer: Self-pay

## 2022-06-14 ENCOUNTER — Ambulatory Visit (HOSPITAL_BASED_OUTPATIENT_CLINIC_OR_DEPARTMENT_OTHER): Payer: Self-pay | Admitting: Certified Registered Nurse Anesthetist

## 2022-06-14 DIAGNOSIS — R040 Epistaxis: Secondary | ICD-10-CM | POA: Insufficient documentation

## 2022-06-14 DIAGNOSIS — I8510 Secondary esophageal varices without bleeding: Secondary | ICD-10-CM

## 2022-06-14 DIAGNOSIS — E119 Type 2 diabetes mellitus without complications: Secondary | ICD-10-CM | POA: Insufficient documentation

## 2022-06-14 DIAGNOSIS — K7031 Alcoholic cirrhosis of liver with ascites: Secondary | ICD-10-CM

## 2022-06-14 DIAGNOSIS — I10 Essential (primary) hypertension: Secondary | ICD-10-CM | POA: Insufficient documentation

## 2022-06-14 DIAGNOSIS — K7460 Unspecified cirrhosis of liver: Secondary | ICD-10-CM

## 2022-06-14 DIAGNOSIS — Z7984 Long term (current) use of oral hypoglycemic drugs: Secondary | ICD-10-CM

## 2022-06-14 DIAGNOSIS — I8511 Secondary esophageal varices with bleeding: Secondary | ICD-10-CM

## 2022-06-14 HISTORY — PX: ESOPHAGOGASTRODUODENOSCOPY (EGD) WITH PROPOFOL: SHX5813

## 2022-06-14 HISTORY — PX: ESOPHAGEAL BANDING: SHX5518

## 2022-06-14 LAB — COMPREHENSIVE METABOLIC PANEL
ALT: 35 U/L (ref 0–44)
AST: 48 U/L — ABNORMAL HIGH (ref 15–41)
Albumin: 2.9 g/dL — ABNORMAL LOW (ref 3.5–5.0)
Alkaline Phosphatase: 85 U/L (ref 38–126)
Anion gap: 6 (ref 5–15)
BUN: 15 mg/dL (ref 6–20)
CO2: 20 mmol/L — ABNORMAL LOW (ref 22–32)
Calcium: 9 mg/dL (ref 8.9–10.3)
Chloride: 112 mmol/L — ABNORMAL HIGH (ref 98–111)
Creatinine, Ser: 0.54 mg/dL — ABNORMAL LOW (ref 0.61–1.24)
GFR, Estimated: >60 mL/min (ref >60)
Glucose, Bld: 142 mg/dL — ABNORMAL HIGH (ref 70–99)
Potassium: 3.8 mmol/L (ref 3.5–5.1)
Sodium: 138 mmol/L (ref 135–145)
Total Bilirubin: 1.7 mg/dL — ABNORMAL HIGH (ref 0.3–1.2)
Total Protein: 7.2 g/dL (ref 6.5–8.1)

## 2022-06-14 LAB — CBC
HCT: 32.5 % — ABNORMAL LOW (ref 39.0–52.0)
Hemoglobin: 9.7 g/dL — ABNORMAL LOW (ref 13.0–17.0)
MCH: 25.1 pg — ABNORMAL LOW (ref 26.0–34.0)
MCHC: 29.8 g/dL — ABNORMAL LOW (ref 30.0–36.0)
MCV: 84 fL (ref 80.0–100.0)
Platelets: 64 10*3/uL — ABNORMAL LOW (ref 150–400)
RBC: 3.87 MIL/uL — ABNORMAL LOW (ref 4.22–5.81)
RDW: 18 % — ABNORMAL HIGH (ref 11.5–15.5)
WBC: 2.2 10*3/uL — ABNORMAL LOW (ref 4.0–10.5)
nRBC: 0 % (ref 0.0–0.2)

## 2022-06-14 LAB — GLUCOSE, CAPILLARY: Glucose-Capillary: 130 mg/dL — ABNORMAL HIGH (ref 70–99)

## 2022-06-14 SURGERY — ESOPHAGOGASTRODUODENOSCOPY (EGD) WITH PROPOFOL
Anesthesia: Monitor Anesthesia Care | Site: Esophagus

## 2022-06-14 MED ORDER — LACTATED RINGERS IV SOLN
INTRAVENOUS | Status: AC | PRN
  Administered 2022-06-14: 1000 mL via INTRAVENOUS

## 2022-06-14 MED ORDER — FUROSEMIDE 20 MG PO TABS: 20.0000 mg | ORAL_TABLET | Freq: Every day | ORAL | 1 refills | Status: DC

## 2022-06-14 MED ORDER — SPIRONOLACTONE 50 MG PO TABS: 50.0000 mg | ORAL_TABLET | Freq: Every day | ORAL | 1 refills | Status: DC

## 2022-06-14 MED ORDER — NADOLOL 20 MG PO TABS: 20.0000 mg | ORAL_TABLET | Freq: Every day | ORAL | 0 refills | Status: DC

## 2022-06-14 MED ORDER — SODIUM CHLORIDE 0.9 % IV SOLN: INTRAVENOUS | Status: DC

## 2022-06-14 MED ORDER — PROPOFOL 10 MG/ML IV BOLUS
INTRAVENOUS | Status: DC | PRN
  Administered 2022-06-14: 30 mg via INTRAVENOUS
  Administered 2022-06-14 (×3): 20 mg via INTRAVENOUS

## 2022-06-14 MED ORDER — PROPOFOL 500 MG/50ML IV EMUL
INTRAVENOUS | Status: AC
  Filled 2022-06-14: qty 50

## 2022-06-14 MED ORDER — PROPOFOL 500 MG/50ML IV EMUL
INTRAVENOUS | Status: DC | PRN
  Administered 2022-06-14: 150 ug/kg/min via INTRAVENOUS

## 2022-06-14 SURGICAL SUPPLY — 15 items

## 2022-06-14 NOTE — Anesthesia Preprocedure Evaluation (Signed)
Anesthesia Evaluation  Patient identified by MRN, date of birth, ID band Patient awake    Reviewed: Allergy & Precautions, NPO status , Patient's Chart, lab work & pertinent test results  History of Anesthesia Complications Negative for: history of anesthetic complications  Airway Mallampati: III  TM Distance: >3 FB Neck ROM: Full    Dental  (+) Dental Advisory Given, Missing, Chipped,    Pulmonary neg pulmonary ROS,    breath sounds clear to auscultation       Cardiovascular hypertension, Pt. on medications and Pt. on home beta blockers (-) angina(-) Past MI  Rhythm:Regular     Neuro/Psych negative neurological ROS  negative psych ROS   GI/Hepatic (+) Cirrhosis       , Lab Results      Component                Value               Date                      ALT                      63 (H)              05/18/2022                AST                      38 (H)              05/18/2022                ALKPHOS                  110                 05/18/2022                BILITOT                  5.2 (H)             05/18/2022              Endo/Other  diabetes, Type 2  Renal/GU Lab Results      Component                Value               Date                      CREATININE               0.49                05/18/2022                Musculoskeletal   Abdominal   Peds  Hematology  (+) Blood dyscrasia, anemia , Lab Results      Component                Value               Date                      WBC  4.5                 05/18/2022                HGB                      9.8 (L)             05/18/2022                HCT                      30.4 (L)            05/18/2022                MCV                      83.0                05/18/2022                PLT                      98.0 (L)            05/18/2022              Anesthesia Other Findings Left ventricle: The cavity size was normal.  Wall thickness was  increased in a pattern of mild LVH. Systolic function was normal.  The estimated ejection fraction was in the range of 60% to 65%.  Wall motion was normal; there were no regional wall motion  abnormalities. Left ventricular diastolic function parameters  were normal.  - Aortic valve: Moderately calcified annulus. Trileaflet;  moderately thickened leaflets. Valve area (VTI): 2.39 cm^2. Valve  area (Vmax): 2.26 cm^2. Valve area (Vmean): 2.4 cm^2.  - Mitral valve: Mildly calcified annulus. Mildly thickened leaflets  . There was mild regurgitation.  - Left atrium: The atrium was severely dilated.  - Atrial septum: No defect or patent foramen ovale was identified.  - Pulmonary arteries: Systolic pressure was moderately increased.  PA peak pressure: 35 mm Hg (S).  - Technically adequate study.   Reproductive/Obstetrics                             Anesthesia Physical Anesthesia Plan  ASA: 3  Anesthesia Plan: MAC   Post-op Pain Management: Minimal or no pain anticipated   Induction: Intravenous  PONV Risk Score and Plan: 1 and Propofol infusion, TIVA and Treatment may vary due to age or medical condition  Airway Management Planned: Nasal Cannula and Natural Airway  Additional Equipment: None  Intra-op Plan:   Post-operative Plan:   Informed Consent: I have reviewed the patients History and Physical, chart, labs and discussed the procedure including the risks, benefits and alternatives for the proposed anesthesia with the patient or authorized representative who has indicated his/her understanding and acceptance.     Dental advisory given  Plan Discussed with: CRNA  Anesthesia Plan Comments:         Anesthesia Quick Evaluation

## 2022-06-14 NOTE — Transfer of Care (Signed)
Immediate Anesthesia Transfer of Care Note  Patient: Wesley Williams  Procedure(s) Performed: ESOPHAGOGASTRODUODENOSCOPY (EGD) WITH PROPOFOL ESOPHAGEAL BANDING (Esophagus)  Patient Location: Endoscopy Unit  Anesthesia Type:MAC  Level of Consciousness: drowsy  Airway & Oxygen Therapy: Patient Spontanous Breathing and Patient connected to face mask  Post-op Assessment: Report given to RN and Post -op Vital signs reviewed and stable  Post vital signs: Reviewed and stable  Last Vitals:  Vitals Value Taken Time  BP 103/68 06/14/22 0845  Temp    Pulse 70 06/14/22 0847  Resp 19 06/14/22 0847  SpO2 100 % 06/14/22 0847  Vitals shown include unvalidated device data.  Last Pain:  Vitals:   06/14/22 0711  TempSrc: Oral  PainSc: 0-No pain         Complications: No notable events documented.

## 2022-06-14 NOTE — Discharge Instructions (Addendum)
I placed rubber bands on the varices again today - they are getting smaller, which is what we want.  I will be in contact regarding another endoscopy in the future - you will need another one most likely before the end of the year.  I also refilled 3 medications today - [facility] can get them at [facility].  Checking labs today also.  YOU HAD AN ENDOSCOPIC PROCEDURE TODAY: Refer to the procedure report and other information in the discharge instructions given to you for any specific questions about what was found during the examination. If this information does not answer your questions, please call Dr. Celesta Aver office at [phone] to clarify.   YOU SHOULD EXPECT: Some feelings of bloating in the abdomen. Passage of more gas than usual. Walking can help get rid of the air that was put into your GI tract during the procedure and reduce the bloating. If you had a lower endoscopy (such as a colonoscopy or flexible sigmoidoscopy) you may notice spotting of blood in your stool or on the toilet paper. Some abdominal soreness may be present for a day or two, also.  DIET: Your first meal following the procedure should be a light meal and then it is ok to progress to your normal diet. A half-sandwich or bowl of soup is an example of a good first meal. Heavy or fried foods are harder to digest and may make you feel nauseous or bloated. Drink plenty of fluids but you should avoid alcoholic beverages for 24 hours.   ACTIVITY: Your care partner should take you home directly after the procedure. You should plan to take it easy, moving slowly for the rest of the day. You can resume normal activity the day after the procedure however YOU SHOULD NOT DRIVE, use power tools, machinery or perform tasks that involve climbing or major physical exertion for 24 hours (because of the sedation medicines used during the test).   SYMPTOMS TO REPORT IMMEDIATELY: A gastroenterologist can be reached at any hour. Please call  [phone]  for any of the following symptoms:   Following upper endoscopy (EGD, EUS, ERCP, esophageal dilation) Vomiting of blood or coffee ground material  New, significant abdominal pain  New, significant chest pain or pain under the shoulder blades  Painful or persistently difficult swallowing  New shortness of breath  Black, tarry-looking or red, bloody stools

## 2022-06-14 NOTE — Anesthesia Postprocedure Evaluation (Signed)
Anesthesia Post Note  Patient: LONNY EISEN  Procedure(s) Performed: ESOPHAGOGASTRODUODENOSCOPY (EGD) WITH PROPOFOL ESOPHAGEAL BANDING (Esophagus)     Patient location during evaluation: Endoscopy Anesthesia Type: MAC Level of consciousness: awake and alert Pain management: pain level controlled Vital Signs Assessment: post-procedure vital signs reviewed and stable Respiratory status: spontaneous breathing, nonlabored ventilation and respiratory function stable Cardiovascular status: stable and blood pressure returned to baseline Postop Assessment: no apparent nausea or vomiting Anesthetic complications: no   No notable events documented.  Last Vitals:  Vitals:   06/14/22 0900 06/14/22 0910  BP: (!) 134/91 (!) 144/96  Pulse: 63 63  Resp: 18 17  Temp:    SpO2: 100% 100%    Last Pain:  Vitals:   06/14/22 0910  TempSrc:   PainSc: 0-No pain                 [place]

## 2022-06-14 NOTE — Op Note (Signed)
[facility] Patient Name: Wesley Williams Procedure Date: 06/14/2022 MRN: [id number] Attending MD: [name] , MD Date of Birth: Jan 24, 1971 CSN: [id number] Age: 51 Admit Type: Outpatient Procedure:                Upper GI endoscopy Indications:              Esophageal varices, Follow-up of esophageal                            varices, For therapy of esophageal varices s/p                            recurrent variceal bleed 05/2022 and banding then;                            prior banding 2021 Providers:                [name], MD, Mikey College, RN, Fransico Setters                            [place], Technician Referring MD:              Medicines:                Monitored Anesthesia Care Complications:            No immediate complications. Estimated Blood Loss:     Estimated blood loss: none. Procedure:                Pre-Anesthesia Assessment:                           - Prior to the procedure, a History and Physical                            was performed, and patient medications and                            allergies were reviewed. The patient's tolerance of                            previous anesthesia was also reviewed. The risks                            and benefits of the procedure and the sedation                            options and risks were discussed with the patient.                            All questions were answered, and informed consent                            was obtained. Prior Anticoagulants: The patient has                            taken  no previous anticoagulant or antiplatelet                            agents. ASA Grade Assessment: III - A patient with                            severe systemic disease. After reviewing the risks                            and benefits, the patient was deemed in                            satisfactory condition to undergo the procedure.                           After obtaining informed  consent, the endoscope was                            passed under direct vision. Throughout the                            procedure, the patient's blood pressure, pulse, and                            oxygen saturations were monitored continuously. The                            [id number] Olympus endoscope was introduced                            through the mouth, and advanced to the second part                            of duodenum. The upper GI endoscopy was                            accomplished without difficulty. The patient                            tolerated the procedure well. Scope In: Scope Out: Findings:      Grade II varices were found in the lower third of the esophagus. They       were 5 mm in largest diameter. Four bands were successfully placed with       complete eradication, resulting in deflation of varices. There was no       bleeding during and at the end of the procedure.      The exam was otherwise without abnormality.      The cardia and gastric fundus were normal on retroflexion. Impression:               - Grade II esophageal varices. Completely                            eradicated. Banded.                           -  The examination was otherwise normal.                           - No specimens collected. Moderate Sedation:      Not Applicable - Patient had care per Anesthesia. Recommendation:           - Patient has a contact number available for                            emergencies. The signs and symptoms of potential                            delayed complications were discussed with the                            patient. Return to normal activities tomorrow.                            Written discharge instructions were provided to the                            patient.                           - Resume previous diet.                           - Continue present medications.                           - Check CBC and CMET at  hospital today                           Continue spironolactone, furosemide (helping                            ascites) - and continue nadolol and ferrous sulfate                           I will place on list and we will schedule a repeat                            EGD abnd banding later this year Procedure Code(s):        --- Professional ---                           [phone], Esophagogastroduodenoscopy, flexible,                            transoral; with band ligation of esophageal/gastric                            varices Diagnosis Code(s):        --- Professional ---  I85.00, Esophageal varices without bleeding CPT copyright 2019 American Medical Association. All rights reserved. The codes documented in this report are preliminary and upon coder review may  be revised to meet current compliance requirements. [name], MD 06/14/2022 9:00:18 AM This report has been signed electronically. Number of Addenda: 0

## 2022-06-14 NOTE — Interval H&P Note (Signed)
History and Physical Interval Note:  06/14/2022 8:28 AM  [place]  has presented today for surgery, with the diagnosis of esophageal varcies.  The various methods of treatment have been discussed with the patient and family. After consideration of risks, benefits and other options for treatment, the patient has consented to  Procedure(s): ESOPHAGOGASTRODUODENOSCOPY (EGD) WITH PROPOFOL (N/A) as a surgical intervention.  The patient's history has been reviewed, patient examined, no change in status, stable for surgery.  I have reviewed the patient's chart and labs.  Questions were answered to the patient's satisfaction.     Silvano Rusk

## 2022-06-14 NOTE — Anesthesia Procedure Notes (Signed)
Procedure Name: MAC Date/Time: 06/14/2022 8:28 AM  Performed by: Claudia Desanctis, CRNAPre-anesthesia Checklist: Patient identified, Emergency Drugs available, Suction available and Patient being monitored Patient Re-evaluated:Patient Re-evaluated prior to induction Oxygen Delivery Method: Simple face mask

## 2022-06-17 ENCOUNTER — Encounter (HOSPITAL_COMMUNITY): Payer: Self-pay | Admitting: Internal Medicine

## 2022-06-21 ENCOUNTER — Other Ambulatory Visit: Payer: Self-pay

## 2022-06-21 DIAGNOSIS — I8511 Secondary esophageal varices with bleeding: Secondary | ICD-10-CM

## 2022-07-19 ENCOUNTER — Telehealth: Payer: Self-pay | Admitting: Emergency Medicine

## 2022-07-19 NOTE — Telephone Encounter (Signed)
Patient called and stated that he needs a refill on nadolol (CORGARD) 20 MG tablet. He stated that he is completely out and needs it to be called into his pharmacy [id number] - [place] ([place]), [place] - [address]

## 2022-07-19 NOTE — Telephone Encounter (Signed)
Called patient to inform him that this medication was prescribed by Dr. Leone Payor office. Patient will have to call his office to request a new rx.

## 2022-08-03 ENCOUNTER — Encounter (HOSPITAL_COMMUNITY): Payer: Self-pay | Admitting: Internal Medicine

## 2022-08-03 NOTE — Progress Notes (Signed)
Attempted to obtain medical history via telephone, unable to reach at this time. HIPAA compliant voicemail message left requesting return call to pre surgical testing department. 

## 2022-08-10 ENCOUNTER — Encounter (HOSPITAL_COMMUNITY)
Admission: RE | Disposition: A | Discharge status: Home / Self Care | Payer: Self-pay | Source: Home / Self Care | Attending: Internal Medicine

## 2022-08-10 ENCOUNTER — Other Ambulatory Visit: Payer: Self-pay

## 2022-08-10 ENCOUNTER — Ambulatory Visit (HOSPITAL_COMMUNITY): Payer: Self-pay | Admitting: Certified Registered"

## 2022-08-10 ENCOUNTER — Ambulatory Visit (HOSPITAL_BASED_OUTPATIENT_CLINIC_OR_DEPARTMENT_OTHER): Payer: Self-pay | Admitting: Certified Registered"

## 2022-08-10 ENCOUNTER — Ambulatory Visit (HOSPITAL_COMMUNITY)
Admission: RE | Admit: 2022-08-10 | Discharge: 2022-08-10 | Disposition: A | Discharge status: Home / Self Care | Payer: Self-pay | Attending: Internal Medicine | Admitting: Internal Medicine

## 2022-08-10 ENCOUNTER — Encounter (HOSPITAL_COMMUNITY): Payer: Self-pay | Admitting: Internal Medicine

## 2022-08-10 DIAGNOSIS — I10 Essential (primary) hypertension: Secondary | ICD-10-CM | POA: Insufficient documentation

## 2022-08-10 DIAGNOSIS — E119 Type 2 diabetes mellitus without complications: Secondary | ICD-10-CM | POA: Insufficient documentation

## 2022-08-10 DIAGNOSIS — Z7984 Long term (current) use of oral hypoglycemic drugs: Secondary | ICD-10-CM | POA: Insufficient documentation

## 2022-08-10 DIAGNOSIS — K7460 Unspecified cirrhosis of liver: Secondary | ICD-10-CM

## 2022-08-10 DIAGNOSIS — D61818 Other pancytopenia: Secondary | ICD-10-CM | POA: Insufficient documentation

## 2022-08-10 DIAGNOSIS — K7031 Alcoholic cirrhosis of liver with ascites: Secondary | ICD-10-CM | POA: Insufficient documentation

## 2022-08-10 DIAGNOSIS — I8511 Secondary esophageal varices with bleeding: Secondary | ICD-10-CM

## 2022-08-10 DIAGNOSIS — I8510 Secondary esophageal varices without bleeding: Secondary | ICD-10-CM

## 2022-08-10 DIAGNOSIS — Z833 Family history of diabetes mellitus: Secondary | ICD-10-CM | POA: Insufficient documentation

## 2022-08-10 DIAGNOSIS — K3189 Other diseases of stomach and duodenum: Secondary | ICD-10-CM

## 2022-08-10 DIAGNOSIS — D649 Anemia, unspecified: Secondary | ICD-10-CM | POA: Insufficient documentation

## 2022-08-10 DIAGNOSIS — I8500 Esophageal varices without bleeding: Secondary | ICD-10-CM

## 2022-08-10 DIAGNOSIS — K766 Portal hypertension: Secondary | ICD-10-CM | POA: Insufficient documentation

## 2022-08-10 HISTORY — PX: ESOPHAGOGASTRODUODENOSCOPY (EGD) WITH PROPOFOL: SHX5813

## 2022-08-10 HISTORY — PX: ESOPHAGEAL BANDING: SHX5518

## 2022-08-10 LAB — COMPREHENSIVE METABOLIC PANEL
ALT: 26 U/L (ref 0–44)
AST: 34 U/L (ref 15–41)
Albumin: 3.2 g/dL — ABNORMAL LOW (ref 3.5–5.0)
Alkaline Phosphatase: 74 U/L (ref 38–126)
Anion gap: 6 (ref 5–15)
BUN: 18 mg/dL (ref 6–20)
CO2: 22 mmol/L (ref 22–32)
Calcium: 8.9 mg/dL (ref 8.9–10.3)
Chloride: 108 mmol/L (ref 98–111)
Creatinine, Ser: 0.65 mg/dL (ref 0.61–1.24)
GFR, Estimated: >60 mL/min (ref >60)
Glucose, Bld: 152 mg/dL — ABNORMAL HIGH (ref 70–99)
Potassium: 4.1 mmol/L (ref 3.5–5.1)
Sodium: 136 mmol/L (ref 135–145)
Total Bilirubin: 1.5 mg/dL — ABNORMAL HIGH (ref 0.3–1.2)
Total Protein: 7 g/dL (ref 6.5–8.1)

## 2022-08-10 LAB — GLUCOSE, CAPILLARY: Glucose-Capillary: 146 mg/dL — ABNORMAL HIGH (ref 70–99)

## 2022-08-10 LAB — PROTIME-INR
INR: 1.4 — ABNORMAL HIGH (ref 0.8–1.2)
Prothrombin Time: 17.1 seconds — ABNORMAL HIGH (ref 11.4–15.2)

## 2022-08-10 LAB — CBC WITH DIFFERENTIAL/PLATELET
Abs Immature Granulocytes: 0 10*3/uL (ref 0.00–0.07)
Basophils Absolute: 0 10*3/uL (ref 0.0–0.1)
Basophils Relative: 1 %
Eosinophils Absolute: 0.1 10*3/uL (ref 0.0–0.5)
Eosinophils Relative: 5 %
HCT: 35.4 % — ABNORMAL LOW (ref 39.0–52.0)
Hemoglobin: 12 g/dL — ABNORMAL LOW (ref 13.0–17.0)
Immature Granulocytes: 0 %
Lymphocytes Relative: 20 %
Lymphs Abs: 0.5 10*3/uL — ABNORMAL LOW (ref 0.7–4.0)
MCH: 29.9 pg (ref 26.0–34.0)
MCHC: 33.9 g/dL (ref 30.0–36.0)
MCV: 88.3 fL (ref 80.0–100.0)
Monocytes Absolute: 0.4 10*3/uL (ref 0.1–1.0)
Monocytes Relative: 14 %
Neutro Abs: 1.5 10*3/uL — ABNORMAL LOW (ref 1.7–7.7)
Neutrophils Relative %: 60 %
Platelets: 57 10*3/uL — ABNORMAL LOW (ref 150–400)
RBC: 4.01 MIL/uL — ABNORMAL LOW (ref 4.22–5.81)
RDW: 19.5 % — ABNORMAL HIGH (ref 11.5–15.5)
WBC: 2.5 10*3/uL — ABNORMAL LOW (ref 4.0–10.5)
nRBC: 0 % (ref 0.0–0.2)

## 2022-08-10 SURGERY — ESOPHAGOGASTRODUODENOSCOPY (EGD) WITH PROPOFOL
Anesthesia: Monitor Anesthesia Care

## 2022-08-10 MED ORDER — SODIUM CHLORIDE 0.9 % IV SOLN: INTRAVENOUS | Status: DC

## 2022-08-10 MED ORDER — PROPOFOL 500 MG/50ML IV EMUL
INTRAVENOUS | Status: AC
  Filled 2022-08-10: qty 50

## 2022-08-10 MED ORDER — LACTATED RINGERS IV SOLN: INTRAVENOUS | Status: DC

## 2022-08-10 MED ORDER — PROPOFOL 10 MG/ML IV BOLUS
INTRAVENOUS | Status: DC | PRN
  Administered 2022-08-10 (×3): 20 mg via INTRAVENOUS

## 2022-08-10 MED ORDER — PROPOFOL 500 MG/50ML IV EMUL
INTRAVENOUS | Status: DC | PRN
  Administered 2022-08-10: 150 ug/kg/min via INTRAVENOUS

## 2022-08-10 MED ORDER — LIDOCAINE 2% (20 MG/ML) 5 ML SYRINGE
INTRAMUSCULAR | Status: DC | PRN
  Administered 2022-08-10: 100 mg via INTRAVENOUS

## 2022-08-10 SURGICAL SUPPLY — 15 items

## 2022-08-10 NOTE — H&P (Signed)
[facility] History and Physical   Primary Care Physician:  Georgina Quint, MD   Reason for Procedure:   varices  Plan:    EGD, possible banding     HPI: Wesley Williams is a 51 y.o. male w/ EtOH cirrhosis and hx varices w/ bleeding here for surveillance and possible banding. Last EGD 06/14/22 w/ banding.   Past Medical History:  Diagnosis Date   Alcohol abuse    Cirrhosis (HCC)    Hypertension    Portal hypertension (HCC)-colopathy    Rhabdomyolysis 04/23/2016   Secondary esophageal varices without bleeding (HCC)    Type 2 diabetes mellitus (HCC)     Past Surgical History:  Procedure Laterality Date   COLONOSCOPY WITH PROPOFOL N/A 03/19/2020   Procedure: COLONOSCOPY WITH PROPOFOL;  Surgeon: Iva Boop, MD;  Location: [facility] ENDOSCOPY;  Service: Endoscopy;  Laterality: N/A;   ESOPHAGEAL BANDING  03/19/2020   Procedure: ESOPHAGEAL BANDING;  Surgeon: Iva Boop, MD;  Location: [facility] ENDOSCOPY;  Service: Endoscopy;;   ESOPHAGEAL BANDING N/A 04/07/2020   Procedure: ESOPHAGEAL BANDING;  Surgeon: Iva Boop, MD;  Location: [facility] ENDOSCOPY;  Service: Endoscopy;  Laterality: N/A;   ESOPHAGEAL BANDING N/A 05/19/2020   Procedure: ESOPHAGEAL BANDING;  Surgeon: Lynann Bologna, MD;  Location: [facility] ENDOSCOPY;  Service: Endoscopy;  Laterality: N/A;   ESOPHAGEAL BANDING  05/09/2022   Procedure: ESOPHAGEAL BANDING;  Surgeon: [name], MD;  Location: [facility] ENDOSCOPY;  Service: Gastroenterology;;   ESOPHAGEAL BANDING  06/14/2022   Procedure: ESOPHAGEAL BANDING;  Surgeon: Iva Boop, MD;  Location: [facility] ENDOSCOPY;  Service: Gastroenterology;;   ESOPHAGOGASTRODUODENOSCOPY (EGD) WITH PROPOFOL N/A 03/19/2020   Procedure: ESOPHAGOGASTRODUODENOSCOPY (EGD) WITH PROPOFOL;  Surgeon: Iva Boop, MD;  Location: [facility] ENDOSCOPY;  Service: Endoscopy;  Laterality: N/A;   ESOPHAGOGASTRODUODENOSCOPY (EGD) WITH PROPOFOL N/A 04/07/2020   Procedure: ESOPHAGOGASTRODUODENOSCOPY (EGD) WITH  PROPOFOL;  Surgeon: Iva Boop, MD;  Location: [facility] ENDOSCOPY;  Service: Endoscopy;  Laterality: N/A;   ESOPHAGOGASTRODUODENOSCOPY (EGD) WITH PROPOFOL N/A 05/19/2020   Procedure: ESOPHAGOGASTRODUODENOSCOPY (EGD) WITH PROPOFOL;  Surgeon: Lynann Bologna, MD;  Location: [facility] ENDOSCOPY;  Service: Endoscopy;  Laterality: N/A;   ESOPHAGOGASTRODUODENOSCOPY (EGD) WITH PROPOFOL N/A 05/09/2022   Procedure: ESOPHAGOGASTRODUODENOSCOPY (EGD) WITH PROPOFOL;  Surgeon: [name], MD;  Location: [facility] ENDOSCOPY;  Service: Gastroenterology;  Laterality: N/A;   ESOPHAGOGASTRODUODENOSCOPY (EGD) WITH PROPOFOL N/A 06/14/2022   Procedure: ESOPHAGOGASTRODUODENOSCOPY (EGD) WITH PROPOFOL;  Surgeon: Iva Boop, MD;  Location: [facility] ENDOSCOPY;  Service: Gastroenterology;  Laterality: N/A;    Prior to Admission medications   Medication Sig Start Date End Date Taking? Authorizing Provider  ferrous sulfate 325 (65 FE) MG tablet Take 325 mg by mouth daily with breakfast.   Yes [provider]  furosemide (LASIX) 20 MG tablet Take 1 tablet (20 mg total) by mouth daily. 06/14/22  Yes Iva Boop, MD  metFORMIN (GLUCOPHAGE) 500 MG tablet Take 1 tablet (500 mg total) by mouth 2 (two) times daily with a meal. 05/13/22 08/11/22 Yes Dahal, Melina Schools, MD  nadolol (CORGARD) 20 MG tablet Take 1 tablet (20 mg total) by mouth daily. 06/14/22  Yes Iva Boop, MD  spironolactone (ALDACTONE) 50 MG tablet Take 1 tablet (50 mg total) by mouth daily. 06/14/22  Yes Iva Boop, MD    Current Facility-Administered Medications  Medication Dose Route Frequency Provider Last Rate Last Admin   0.9 %  sodium chloride infusion   Intravenous Continuous Iva Boop, MD  lactated ringers infusion   Intravenous Continuous Iva Boop, MD 50 mL/hr at 08/10/22 0719 New Bag at 08/10/22 0719    Allergies as of 06/21/2022   (No Known Allergies)    Family History  Problem Relation Age of Onset   Diabetes Mother      Social History   Socioeconomic History   Marital status: Single    Spouse name: Not on file   Number of children: Not on file   Years of education: Not on file   Highest education level: Not on file  Occupational History   Not on file  Tobacco Use   Smoking status: Never   Smokeless tobacco: Never  Vaping Use   Vaping Use: Never used  Substance and Sexual Activity   Alcohol use: Not Currently    Comment: former etoh abuse, none since july 2021.   Drug use: No   Sexual activity: Yes  Other Topics Concern   Not on file  Social History Narrative   Single lives w/ long-term girlfriend   Works for a [employer]   Alcohol abuse hx   Never smoker, no drugs   Social Determinants of Health   Financial Resource Strain: Not on file  Food Insecurity: No Food Insecurity (05/12/2022)   Hunger Vital Sign    Worried About Running Out of Food in the Last Year: Never true    Ran Out of Food in the Last Year: Never true  Transportation Needs: No Transportation Needs (05/12/2022)   PRAPARE - Administrator, Civil Service (Medical): No    Lack of Transportation (Non-Medical): No  Physical Activity: Not on file  Stress: Not on file  Social Connections: Not on file  Intimate Partner Violence: Not At Risk (05/12/2022)   Humiliation, Afraid, Rape, and Kick questionnaire    Fear of Current or Ex-Partner: No    Emotionally Abused: No    Physically Abused: No    Sexually Abused: No    Review of Systems:  All other review of systems negative except as mentioned in the HPI.  Physical Exam: Vital signs BP 137/83   Pulse 60   Temp (!) 97.1 F (36.2 C) (Temporal)   Resp 16   SpO2 100%   General:   Alert,  Well-developed, well-nourished, pleasant and cooperative in NAD Lungs:  Clear throughout to auscultation.   Heart:  Regular rate and rhythm; no murmurs, clicks, rubs,  or gallops. Abdomen:  Soft, nontender and nondistended. Normal bowel sounds.  Sl distended ? Mild  ascites Neuro/Psych:  Alert and cooperative. Normal mood and affect. A and O x 3   @Taurean Ju  , MD, [facility] [phone] (pager) 08/10/2022 7:35 AM@

## 2022-08-10 NOTE — Discharge Instructions (Signed)
I placed 3 more bands on the varices today.  We will need to set up another procedure in 2024 - we will contact you in the next couple of weeks to set it up.  Stay on medications you are taking.  I appreciate the opportunity to care for you. Iva Boop, MD, FACG  YOU HAD AN ENDOSCOPIC PROCEDURE TODAY: Refer to the procedure report and other information in the discharge instructions given to you for any specific questions about what was found during the examination. If this information does not answer your questions, please call Dr. Marvell Fuller office at [phone] to clarify.   YOU SHOULD EXPECT: Some feelings of bloating in the abdomen. Passage of more gas than usual. Walking can help get rid of the air that was put into your GI tract during the procedure and reduce the bloating. If you had a lower endoscopy (such as a colonoscopy or flexible sigmoidoscopy) you may notice spotting of blood in your stool or on the toilet paper. Some abdominal soreness may be present for a day or two, also.  DIET: Your first meal following the procedure should be a light meal and then it is ok to progress to your normal diet. A half-sandwich or bowl of soup is an example of a good first meal. Heavy or fried foods are harder to digest and may make you feel nauseous or bloated. Drink plenty of fluids but you should avoid alcoholic beverages for 24 hours.   ACTIVITY: Your care partner should take you home directly after the procedure. You should plan to take it easy, moving slowly for the rest of the day. You can resume normal activity the day after the procedure however YOU SHOULD NOT DRIVE, use power tools, machinery or perform tasks that involve climbing or major physical exertion for 24 hours (because of the sedation medicines used during the test).   SYMPTOMS TO REPORT IMMEDIATELY: A gastroenterologist can be reached at any hour. Please call [phone]  for any of the following symptoms:   Following upper  endoscopy (EGD, EUS, ERCP, esophageal dilation) Vomiting of blood or coffee ground material  New, significant abdominal pain  New, significant chest pain or pain under the shoulder blades  Painful or persistently difficult swallowing  New shortness of breath  Black, tarry-looking or red, bloody stools

## 2022-08-10 NOTE — Transfer of Care (Signed)
Immediate Anesthesia Transfer of Care Note  Patient: Wesley Williams  Procedure(s) Performed: ESOPHAGOGASTRODUODENOSCOPY (EGD) WITH PROPOFOL ESOPHAGEAL BANDING  Patient Location: PACU and Endoscopy Unit  Anesthesia Type:MAC  Level of Consciousness: drowsy and patient cooperative  Airway & Oxygen Therapy: Patient Spontanous Breathing and Patient connected to face mask oxygen  Post-op Assessment: Report given to RN and Post -op Vital signs reviewed and stable  Post vital signs: Reviewed and stable  Last Vitals:  Vitals Value Taken Time  BP 107/73 08/10/22 0803  Temp    Pulse 57 08/10/22 0804  Resp 18 08/10/22 0804  SpO2 100 % 08/10/22 0804  Vitals shown include unvalidated device data.  Last Pain:  Vitals:   08/10/22 0700  TempSrc: Temporal  PainSc: 0-No pain         Complications: No notable events documented.

## 2022-08-10 NOTE — Op Note (Signed)
[facility] Patient Name: Wesley Williams Procedure Date: 08/10/2022 MRN: [id number] Attending MD: Iva Boop , MD, [id number] Date of Birth: September 20, 1970 CSN: [id number] Age: 51 Admit Type: Outpatient Procedure:                Upper GI endoscopy Indications:              Esophageal varices, Follow-up of esophageal                            varices, For therapy of esophageal varices Providers:                Iva Boop, MD, Ricard Dillon                            Technician, Technician Referring MD:              Medicines:                Monitored Anesthesia Care Complications:            No immediate complications. Estimated Blood Loss:     Estimated blood loss: none. Procedure:                Pre-Anesthesia Assessment:                           - Prior to the procedure, a History and Physical                            was performed, and patient medications and                            allergies were reviewed. The patient's tolerance of                            previous anesthesia was also reviewed. The risks                            and benefits of the procedure and the sedation                            options and risks were discussed with the patient.                            All questions were answered, and informed consent                            was obtained. Prior Anticoagulants: The patient has                            taken no anticoagulant or antiplatelet agents. ASA                            Grade Assessment: III - A patient with severe  systemic disease. After reviewing the risks and                            benefits, the patient was deemed in satisfactory                            condition to undergo the procedure.                           After obtaining informed consent, the endoscope was                            passed under direct vision. Throughout the                             procedure, the patient's blood pressure, pulse, and                            oxygen saturations were monitored continuously. The                            [id number]-H190 ([id number]) Olympus endoscope was introduced                            through the mouth, and advanced to the second part                            of duodenum. The upper GI endoscopy was                            accomplished without difficulty. The patient                            tolerated the procedure well. Scope In: Scope Out: Findings:      Grade II, small (< 5 mm) varices were found in the distal esophagus.       They were 4 mm in largest diameter. Three bands were successfully placed       with complete eradication, resulting in deflation of varices. There was       no bleeding during and at the end of the procedure. Estimated blood       loss: none.      Diffuse moderately erythematous mucosa without bleeding was found in the       entire examined stomach.      The exam was otherwise without abnormality.      The cardia and gastric fundus were normal on retroflexion. Impression:               - Grade II and small (< 5 mm) esophageal varices.                            Some scarring from prior banding also. Completely                            eradicated. Banded.                           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                           - The examination was otherwise normal.                           - No specimens collected. Moderate Sedation:      Not Applicable - Patient had care per Anesthesia. Recommendation:           - Patient has a contact number available for                            emergencies. The signs and symptoms of potential                            delayed complications were discussed with the                            patient. Return to normal activities tomorrow.                            Written discharge instructions  were provided to the                            patient.                           - Resume previous diet.                           - Continue present medications.                           - Repeat upper endoscopy will be scheduled. CBC,                            CMET and INr checked today Procedure Code(s):        --- Professional ---                           [phone], Esophagogastroduodenoscopy, flexible,                            transoral; with band ligation of esophageal/gastric                            varices Diagnosis Code(s):        --- Professional ---                           I85.00, Esophageal varices without bleeding                           K31.89, Other diseases of stomach and duodenum CPT copyright  2022 American Medical Association. All rights reserved. The codes documented in this report are preliminary and upon coder review may  be revised to meet current compliance requirements. Iva Boop, MD 08/10/2022 8:11:34 AM This report has been signed electronically. Number of Addenda: 0

## 2022-08-10 NOTE — Addendum Note (Signed)
Addendum  created 08/10/22 0847 by Sindy Guadeloupe, CRNA   Flowsheet accepted

## 2022-08-10 NOTE — Anesthesia Preprocedure Evaluation (Signed)
Anesthesia Evaluation  Patient identified by MRN, date of birth, ID band Patient awake    Reviewed: Allergy & Precautions, H&P , NPO status , Patient's Chart, lab work & pertinent test results  Airway Mallampati: II  TM Distance: >3 FB Neck ROM: Full    Dental no notable dental hx.    Pulmonary neg pulmonary ROS   Pulmonary exam normal breath sounds clear to auscultation       Cardiovascular hypertension, Normal cardiovascular exam Rhythm:Regular Rate:Normal     Neuro/Psych negative neurological ROS  negative psych ROS   GI/Hepatic negative GI ROS,,,(+) Cirrhosis   Esophageal Varices      Endo/Other  diabetes    Renal/GU negative Renal ROS  negative genitourinary   Musculoskeletal negative musculoskeletal ROS (+)    Abdominal   Peds negative pediatric ROS (+)  Hematology  (+) Blood dyscrasia, anemia pancytopenia   Anesthesia Other Findings   Reproductive/Obstetrics negative OB ROS                             Anesthesia Physical Anesthesia Plan  ASA: 3  Anesthesia Plan: MAC   Post-op Pain Management: Minimal or no pain anticipated   Induction: Intravenous  PONV Risk Score and Plan: 1 and Propofol infusion and Treatment may vary due to age or medical condition  Airway Management Planned: Simple Face Mask  Additional Equipment:   Intra-op Plan:   Post-operative Plan:   Informed Consent: I have reviewed the patients History and Physical, chart, labs and discussed the procedure including the risks, benefits and alternatives for the proposed anesthesia with the patient or authorized representative who has indicated his/her understanding and acceptance.     Dental advisory given  Plan Discussed with: CRNA and Surgeon  Anesthesia Plan Comments:        Anesthesia Quick Evaluation

## 2022-08-10 NOTE — Anesthesia Procedure Notes (Signed)
Procedure Name: MAC Date/Time: 08/10/2022 7:41 AM  Performed by: Eben Burow, CRNAPre-anesthesia Checklist: Patient identified, Emergency Drugs available, Suction available, Patient being monitored and Timeout performed Oxygen Delivery Method: Simple face mask Placement Confirmation: positive ETCO2

## 2022-08-10 NOTE — Anesthesia Postprocedure Evaluation (Signed)
Anesthesia Post Note  Patient: RAYMIR FROMMELT  Procedure(s) Performed: ESOPHAGOGASTRODUODENOSCOPY (EGD) WITH PROPOFOL ESOPHAGEAL BANDING     Patient location during evaluation: PACU Anesthesia Type: MAC Level of consciousness: awake and alert Pain management: pain level controlled Vital Signs Assessment: post-procedure vital signs reviewed and stable Respiratory status: spontaneous breathing, nonlabored ventilation, respiratory function stable and patient connected to nasal cannula oxygen Cardiovascular status: stable and blood pressure returned to baseline Postop Assessment: no apparent nausea or vomiting Anesthetic complications: no  No notable events documented.  Last Vitals:  Vitals:   08/10/22 0810 08/10/22 0820  BP:  137/78  Pulse: (!) 58 (!) 57  Resp: (!) 25 11  Temp:    SpO2: 100% 100%    Last Pain:  Vitals:   08/10/22 0803  TempSrc: Temporal  PainSc: Asleep                 Everleigh Colclasure S

## 2022-08-12 ENCOUNTER — Encounter (HOSPITAL_COMMUNITY): Payer: Self-pay | Admitting: Internal Medicine

## 2022-08-18 ENCOUNTER — Ambulatory Visit: Payer: Self-pay | Admitting: Emergency Medicine

## 2022-09-08 ENCOUNTER — Ambulatory Visit (INDEPENDENT_AMBULATORY_CARE_PROVIDER_SITE_OTHER): Payer: Self-pay | Admitting: Emergency Medicine

## 2022-09-08 ENCOUNTER — Encounter: Payer: Self-pay | Admitting: Emergency Medicine

## 2022-09-08 VITALS — BP 122/78 | HR 63 | Temp 98.0°F | Ht 65.0 in | Wt 185.2 lb

## 2022-09-08 DIAGNOSIS — E1165 Type 2 diabetes mellitus with hyperglycemia: Secondary | ICD-10-CM

## 2022-09-08 DIAGNOSIS — D649 Anemia, unspecified: Secondary | ICD-10-CM

## 2022-09-08 DIAGNOSIS — K766 Portal hypertension: Secondary | ICD-10-CM

## 2022-09-08 DIAGNOSIS — D696 Thrombocytopenia, unspecified: Secondary | ICD-10-CM

## 2022-09-08 DIAGNOSIS — I8510 Secondary esophageal varices without bleeding: Secondary | ICD-10-CM

## 2022-09-08 DIAGNOSIS — K7030 Alcoholic cirrhosis of liver without ascites: Secondary | ICD-10-CM

## 2022-09-08 DIAGNOSIS — D61818 Other pancytopenia: Secondary | ICD-10-CM

## 2022-09-08 DIAGNOSIS — Z125 Encounter for screening for malignant neoplasm of prostate: Secondary | ICD-10-CM

## 2022-09-08 LAB — CBC WITH DIFFERENTIAL/PLATELET
Basophils Absolute: 0 10*3/uL (ref 0.0–0.1)
Basophils Relative: 1 % (ref 0.0–3.0)
Eosinophils Absolute: 0.1 10*3/uL (ref 0.0–0.7)
Eosinophils Relative: 2.9 % (ref 0.0–5.0)
HCT: 39.2 % (ref 39.0–52.0)
Hemoglobin: 14.1 g/dL (ref 13.0–17.0)
Lymphocytes Relative: 13 % (ref 12.0–46.0)
Lymphs Abs: 0.6 10*3/uL — ABNORMAL LOW (ref 0.7–4.0)
MCHC: 35.8 g/dL (ref 30.0–36.0)
MCV: 91.7 fl (ref 78.0–100.0)
Monocytes Absolute: 0.6 10*3/uL (ref 0.1–1.0)
Monocytes Relative: 13 % — ABNORMAL HIGH (ref 3.0–12.0)
Neutro Abs: 3 10*3/uL (ref 1.4–7.7)
Neutrophils Relative %: 70.1 % (ref 43.0–77.0)
Platelets: 62 10*3/uL — ABNORMAL LOW (ref 150.0–400.0)
RBC: 4.28 Mil/uL (ref 4.22–5.81)
RDW: 19.6 % — ABNORMAL HIGH (ref 11.5–15.5)
WBC: 4.3 10*3/uL (ref 4.0–10.5)

## 2022-09-08 LAB — COMPREHENSIVE METABOLIC PANEL
ALT: 28 U/L (ref 0–53)
AST: 33 U/L (ref 0–37)
Albumin: 3.5 g/dL (ref 3.5–5.2)
Alkaline Phosphatase: 96 U/L (ref 39–117)
BUN: 17 mg/dL (ref 6–23)
CO2: 24 mEq/L (ref 19–32)
Calcium: 9.5 mg/dL (ref 8.4–10.5)
Chloride: 101 mEq/L (ref 96–112)
Creatinine, Ser: 0.76 mg/dL (ref 0.40–1.50)
GFR: 103.19 mL/min (ref >60.00)
Glucose, Bld: 265 mg/dL — ABNORMAL HIGH (ref 70–99)
Potassium: 4.5 mEq/L (ref 3.5–5.1)
Sodium: 134 mEq/L — ABNORMAL LOW (ref 135–145)
Total Bilirubin: 1.6 mg/dL — ABNORMAL HIGH (ref 0.2–1.2)
Total Protein: 7.5 g/dL (ref 6.0–8.3)

## 2022-09-08 LAB — POCT GLYCOSYLATED HEMOGLOBIN (HGB A1C): Hemoglobin A1C: 5.9 % — AB (ref 4.0–5.6)

## 2022-09-08 LAB — MICROALBUMIN / CREATININE URINE RATIO
Creatinine,U: 116.8 mg/dL
Microalb Creat Ratio: 0.6 mg/g (ref 0.0–30.0)
Microalb, Ur: <0.7 mg/dL (ref 0.0–1.9)

## 2022-09-08 LAB — LIPID PANEL
Cholesterol: 171 mg/dL (ref 0–200)
HDL: 41.3 mg/dL (ref >39.00)
LDL Cholesterol: 103 mg/dL — ABNORMAL HIGH (ref 0–99)
NonHDL: 129.38
Total CHOL/HDL Ratio: 4
Triglycerides: 134 mg/dL (ref 0.0–149.0)
VLDL: 26.8 mg/dL (ref 0.0–40.0)

## 2022-09-08 LAB — PSA: PSA: 0.17 ng/mL (ref 0.10–4.00)

## 2022-09-08 NOTE — Assessment & Plan Note (Addendum)
Stable and well compensated. Clinically euvolemic. Wt Readings from Last 3 Encounters:  09/08/22 185 lb 4 oz (84 kg)  06/14/22 204 lb 5.9 oz (92.7 kg)  05/19/22 204 lb 6 oz (92.7 kg)  No significant ascites on exam. No signs of hepatic encephalopathy Continue on nadolol 20 mg daily and Aldactone 50 mg daily Continue Lasix 20 mg daily

## 2022-09-08 NOTE — Progress Notes (Signed)
Wesley Williams 52 y.o.   Chief Complaint  Patient presents with   Follow-up    f/u appt, lower back pain off and on     HISTORY OF PRESENT ILLNESS: This is a 52 y.o. male here for 21-month follow-up of chronic medical conditions History of alcoholic liver cirrhosis. Overall doing well.  No recent GI bleed. No complaints or any other medical concerns today.  HPI   Prior to Admission medications   Medication Sig Start Date End Date Taking? Authorizing Provider  ferrous sulfate 325 (65 FE) MG tablet Take 325 mg by mouth daily with breakfast.   Yes [provider]  furosemide (LASIX) 20 MG tablet Take 1 tablet (20 mg total) by mouth daily. 06/14/22  Yes Iva Boop, MD  nadolol (CORGARD) 20 MG tablet Take 1 tablet (20 mg total) by mouth daily. 06/14/22  Yes Iva Boop, MD  spironolactone (ALDACTONE) 50 MG tablet Take 1 tablet (50 mg total) by mouth daily. 06/14/22  Yes Iva Boop, MD  metFORMIN (GLUCOPHAGE) 500 MG tablet Take 1 tablet (500 mg total) by mouth 2 (two) times daily with a meal. 05/13/22 08/11/22  Lorin Glass, MD    No Known Allergies  Patient Active Problem List   Diagnosis Date Noted   Symptomatic anemia 05/08/2022   Type 2 diabetes mellitus with hyperglycemia, without long-term current use of insulin (HCC) 12/28/2021   Hyperglycemia 06/29/2021   Secondary esophageal varices without bleeding (HCC)    Rectal varices    Portal hypertension (HCC)-colopathy    Alcoholic cirrhosis of liver without ascites (HCC) 03/18/2020   Pancytopenia (HCC)    Anemia 12/12/2017   Erectile dysfunction 12/12/2017   Alcohol abuse 04/23/2016    Past Medical History:  Diagnosis Date   Alcohol abuse    Cirrhosis (HCC)    Hypertension    Portal hypertension (HCC)-colopathy    Rhabdomyolysis 04/23/2016   Secondary esophageal varices without bleeding (HCC)    Type 2 diabetes mellitus (HCC)     Past Surgical History:  Procedure Laterality Date   COLONOSCOPY  WITH PROPOFOL N/A 03/19/2020   Procedure: COLONOSCOPY WITH PROPOFOL;  Surgeon: Iva Boop, MD;  Location: [facility] ENDOSCOPY;  Service: Endoscopy;  Laterality: N/A;   ESOPHAGEAL BANDING  03/19/2020   Procedure: ESOPHAGEAL BANDING;  Surgeon: Iva Boop, MD;  Location: [facility] ENDOSCOPY;  Service: Endoscopy;;   ESOPHAGEAL BANDING N/A 04/07/2020   Procedure: ESOPHAGEAL BANDING;  Surgeon: Iva Boop, MD;  Location: [facility] ENDOSCOPY;  Service: Endoscopy;  Laterality: N/A;   ESOPHAGEAL BANDING N/A 05/19/2020   Procedure: ESOPHAGEAL BANDING;  Surgeon: Lynann Bologna, MD;  Location: [facility] ENDOSCOPY;  Service: Endoscopy;  Laterality: N/A;   ESOPHAGEAL BANDING  05/09/2022   Procedure: ESOPHAGEAL BANDING;  Surgeon: [name], MD;  Location: [facility] ENDOSCOPY;  Service: Gastroenterology;;   ESOPHAGEAL BANDING  06/14/2022   Procedure: ESOPHAGEAL BANDING;  Surgeon: Iva Boop, MD;  Location: [facility] ENDOSCOPY;  Service: Gastroenterology;;   ESOPHAGEAL BANDING  08/10/2022   Procedure: ESOPHAGEAL BANDING;  Surgeon: Iva Boop, MD;  Location: [facility] ENDOSCOPY;  Service: Gastroenterology;;   ESOPHAGOGASTRODUODENOSCOPY (EGD) WITH PROPOFOL N/A 03/19/2020   Procedure: ESOPHAGOGASTRODUODENOSCOPY (EGD) WITH PROPOFOL;  Surgeon: Iva Boop, MD;  Location: [facility] ENDOSCOPY;  Service: Endoscopy;  Laterality: N/A;   ESOPHAGOGASTRODUODENOSCOPY (EGD) WITH PROPOFOL N/A 04/07/2020   Procedure: ESOPHAGOGASTRODUODENOSCOPY (EGD) WITH PROPOFOL;  Surgeon: Iva Boop, MD;  Location: [facility] ENDOSCOPY;  Service: Endoscopy;  Laterality: N/A;   ESOPHAGOGASTRODUODENOSCOPY (EGD)  WITH PROPOFOL N/A 05/19/2020   Procedure: ESOPHAGOGASTRODUODENOSCOPY (EGD) WITH PROPOFOL;  Surgeon: [name], MD;  Location: [facility] ENDOSCOPY;  Service: Endoscopy;  Laterality: N/A;   ESOPHAGOGASTRODUODENOSCOPY (EGD) WITH PROPOFOL N/A 05/09/2022   Procedure: ESOPHAGOGASTRODUODENOSCOPY (EGD) WITH PROPOFOL;  Surgeon: Daryel November, MD;  Location: [place];  Service:  Gastroenterology;  Laterality: N/A;   ESOPHAGOGASTRODUODENOSCOPY (EGD) WITH PROPOFOL N/A 06/14/2022   Procedure: ESOPHAGOGASTRODUODENOSCOPY (EGD) WITH PROPOFOL;  Surgeon: [name], MD;  Location: [facility] ENDOSCOPY;  Service: Gastroenterology;  Laterality: N/A;   ESOPHAGOGASTRODUODENOSCOPY (EGD) WITH PROPOFOL N/A 08/10/2022   Procedure: ESOPHAGOGASTRODUODENOSCOPY (EGD) WITH PROPOFOL;  Surgeon: [name], MD;  Location: [facility] ENDOSCOPY;  Service: Gastroenterology;  Laterality: N/A;    Social History   Socioeconomic History   Marital status: Single    Spouse name: Not on file   Number of children: Not on file   Years of education: Not on file   Highest education level: Not on file  Occupational History   Not on file  Tobacco Use   Smoking status: Never   Smokeless tobacco: Never  Vaping Use   Vaping Use: Never used  Substance and Sexual Activity   Alcohol use: Not Currently    Comment: former etoh abuse, none since july 2021.   Drug use: No   Sexual activity: Yes  Other Topics Concern   Not on file  Social History Narrative   Single lives w/ long-term girlfriend   Works for a tree service   Alcohol abuse hx   Never smoker, no drugs   Social Determinants of Health   Financial Resource Strain: Not on file  Food Insecurity: No Food Insecurity (05/12/2022)   Hunger Vital Sign    Worried About Running Out of Food in the Last Year: Never true    [place] in the Last Year: Never true  Transportation Needs: No Transportation Needs (05/12/2022)   PRAPARE - Hydrologist (Medical): No    Lack of Transportation (Non-Medical): No  Physical Activity: Not on file  Stress: Not on file  Social Connections: Not on file  Intimate Partner Violence: Not At Risk (05/12/2022)   Humiliation, Afraid, Rape, and Kick questionnaire    Fear of Current or Ex-Partner: No    Emotionally Abused: No    Physically Abused: No    Sexually Abused: No    Family History   Problem Relation Age of Onset   Diabetes Mother      Review of Systems  Constitutional: Negative.  Negative for chills and fever.  Respiratory: Negative.  Negative for cough and shortness of breath.   Cardiovascular: Negative.  Negative for chest pain and palpitations.  Gastrointestinal:  Negative for abdominal pain, nausea and vomiting.  Genitourinary:        Erectile dysfunction  Skin: Negative.  Negative for rash.  Neurological: Negative.  Negative for dizziness and headaches.    Today's Vitals   09/08/22 0812  BP: 122/78  Pulse: 63  Temp: 98 F (36.7 C)  TempSrc: Oral  SpO2: 98%  Weight: 185 lb 4 oz (84 kg)  Height: 5\' 5"  (1.651 m)   Body mass index is 30.83 kg/m. Wt Readings from Last 3 Encounters:  09/08/22 185 lb 4 oz (84 kg)  06/14/22 204 lb 5.9 oz (92.7 kg)  05/19/22 204 lb 6 oz (92.7 kg)    Physical Exam Vitals reviewed.  Constitutional:      Appearance: Normal appearance.  HENT:  Head: Normocephalic.     Mouth/Throat:     Mouth: Mucous membranes are moist.     Pharynx: Oropharynx is clear.  Eyes:     Extraocular Movements: Extraocular movements intact.     Pupils: Pupils are equal, round, and reactive to light.  Cardiovascular:     Rate and Rhythm: Normal rate and regular rhythm.     Pulses: Normal pulses.     Heart sounds: Normal heart sounds.  Pulmonary:     Effort: Pulmonary effort is normal.     Breath sounds: Normal breath sounds.  Abdominal:     General: There is no distension.     Palpations: Abdomen is soft.     Tenderness: There is no abdominal tenderness.  Musculoskeletal:     Cervical back: No tenderness.     Right lower leg: No edema.     Left lower leg: No edema.  Lymphadenopathy:     Cervical: No cervical adenopathy.  Skin:    General: Skin is warm and dry.     Capillary Refill: Capillary refill takes less than 2 seconds.  Neurological:     General: No focal deficit present.     Mental Status: He is alert and oriented  to person, place, and time.  Psychiatric:        Mood and Affect: Mood normal.        Behavior: Behavior normal.    Results for orders placed or performed in visit on 09/08/22 (from the past 24 hour(s))  POCT HgB A1C     Status: Abnormal   Collection Time: 09/08/22 11:34 AM  Result Value Ref Range   Hemoglobin A1C 5.9 (A) 4.0 - 5.6 %   HbA1c POC (<> result, manual entry)     HbA1c, POC (prediabetic range)     HbA1c, POC (controlled diabetic range)       ASSESSMENT & PLAN: A total of 45 minutes was spent with the patient and counseling/coordination of care regarding preparing for this visit, review of most recent office visit notes, review of multiple chronic medical conditions and their management, review of most recent blood work results including interpretation of today's hemoglobin A1c, review of all medications, education on nutrition, prognosis, documentation, and need for follow-up  Problem List Items Addressed This Visit       Cardiovascular and Mediastinum   Secondary esophageal varices without bleeding (HCC)    No recent episodes of GI bleed.  Stable.      Portal hypertension (HCC)-colopathy     Digestive   Alcoholic cirrhosis of liver without ascites ([place]) - Primary    Stable and well compensated. Clinically euvolemic. Wt Readings from Last 3 Encounters:  09/08/22 185 lb 4 oz (84 kg)  06/14/22 204 lb 5.9 oz (92.7 kg)  05/19/22 204 lb 6 oz (92.7 kg)  No significant ascites on exam. No signs of hepatic encephalopathy Continue on nadolol 20 mg daily and Aldactone 50 mg daily Continue Lasix 20 mg daily       Relevant Orders   Comprehensive metabolic panel   CBC with Differential/Platelet     Endocrine   Type 2 diabetes mellitus with hyperglycemia, without long-term current use of insulin (HCC)    Well-controlled diabetes with hemoglobin A1c of 5.9 Continue metformin 500 mg twice a day Diet and nutrition discussed      Relevant Orders   Urine Microalbumin  w/creat. ratio   Lipid panel   POCT HgB A1C     Hematopoietic and Hemostatic  Pancytopenia (HCC)   Thrombocytopenia (HCC)     Other   Anemia    Stable.  CBC done today. Continue ferrous sulfate 325 mg daily.      Relevant Orders   CBC with Differential/Platelet   Other Visit Diagnoses     Prostate cancer screening       Relevant Orders   PSA      Patient Instructions  Cirrosis Cirrhosis  La cirrosis es una lesin de largo plazo (crnica) en el hgado. El hgado es el rgano interno ms grande del cuerpo, y cumple muchas funciones. Este rgano transforma los Nucor Corporationalimentos en energa, elimina las sustancias txicas de la [place], [place]fab[place] protenas importantes y absorbe las vitaminas necesarias de los alimentos. En la cirrosis, las clulas hepticas son reemplazadas por tejido cicatricial. Esto impide que la sangre circule por el hgado y dificulta que el hgado realice sus funciones. Cules son las causas? Las causas ms comunes de la cirrosis son la hepatitis C y el consumo prolongado de alcohol. Otras causas son las siguientes: Enfermedad por hgado graso no alcohlico [facility](EHGNA). Esto sucede cuando la grasa se deposita en el hgado por otras causas que no sean el alcohol. Infeccin por hepatitis B. Hepatitis autoinmune. En esta enfermedad, el sistema de defensa del cuerpo (sistema inmunitario) ataca por error las clulas del hgado, lo que causa inflamacin. Enfermedades que Marshall & Ilsleyobstruyen los conductos dentro del hgado. Enfermedades hepticas hereditarias, como la hemocromatosis. Esta es una de las enfermedades hepticas hereditarias ms comunes. En esta enfermedad, depsitos de hierro se acumulan en el hgado y otros rganos. Reacciones a determinados medicamentos a Air cabin crewlargo plazo, como la [place]amiodarona, un medicamento para Insurance underwriterel corazn. Infecciones por parsitos. Estas incluyen la esquistosomiasis, que es una enfermedad causada por un platelminto. Contacto prolongado con ciertas sustancias  txicas. Estas sustancias txicas incluyen ciertos solventes orgnicos, tales como cloroformo y tolueno. Qu incrementa el riesgo? Es ms probable que sufra esta afeccin si: Tiene ciertos tipos de hepatitis viral. Consume alcohol en exceso, especialmente si es mujer. Tiene sobrepeso. [place] drogas intravenosas y comparte agujas. Tiene relaciones sexuales sin usar proteccin con alguien que tiene una hepatitis viral. Cules son los signos o sntomas? Es posible que no tenga signos ni sntomas al principio. Puede que los sntomas no se manifiesten hasta que el dao heptico empiece a Theme park managerempeorar. Entre los primeros sntomas, se pueden incluir los siguientes: Debilidad y Merchant navy officercansancio (fatiga). Cambios en los patrones de sueo o dificultad para dormir. Picazn. Dolor a Merchandiser, retailla palpacin en la parte superior derecha del abdomen. Adelgazamiento y prdida de masa muscular. Nuseas. Prdida del apetito. Entre los sntomas que aparecen en una etapa posterior se pueden incluir los siguientes: Fatiga o debilidad que [place]. Piel y ojos amarillos (ictericia). Acumulacin de lquido en el abdomen (ascitis). Puede notar que la ropa se le aprieta alrededor de la cintura. Aumento de peso e hinchazn de los pies y los tobillos (edema). Dificultad para respirar. Tener hematomas o hemorragias. Vomitar sangre o tener heces negras o con sangre. Confusin mental. Cmo se diagnostica? El mdico puede sospechar la presencia de cirrosis en funcin de los sntomas y la historia clnica, en especial si tiene otras enfermedades o antecedentes de consumo de alcohol. El Office Depotmdico le har un examen fsico para palparle el hgado y buscar signos de cirrosis. Las pruebas pueden incluir las siguientes: Anlisis de sangre para Chief Operating Officercontrolar lo siguiente: Si tiene hepatitis B o C. La funcin renal. La funcin heptica. Pruebas de diagnstico por imgenes, por ejemplo: Resonancia magntica (RM) o exploracin  por tomografa  computarizada (TC) para buscar los cambios que se observan en los casos de cirrosis [place]. Ecografa para determinar si el tejido heptico normal est siendo reemplazado por tejido cicatricial. Un procedimiento en el que se [place] una aguja larga para tomar Lauris Poag de tejido heptico para estudiarlo en un laboratorio (biopsia). La biopsia de hgado puede confirmar el diagnstico de cirrosis. Cmo se trata? El tratamiento de esta afeccin depende de la magnitud del dao heptico y qu lo caus. Puede incluir el tratamiento de los sntomas de la cirrosis o de las causas subyacentes, a fin de Engineer, manufacturing el avance del [place]. El tratamiento puede incluir: Cambios de [place] de vida, como: Seguir una dieta saludable. Puede tener que consultar al mdico o a un nutricionista para Runner, broadcasting/film/video de alimentacin. Restringir la ingesta de sal. Mantener un peso saludable. No consumir drogas o alcohol. Tomar medicamentos para: Warehouse manager las infecciones del hgado u otras infecciones. Controlar la picazn. Disminuir la acumulacin de lquido. Reducir ciertas sustancias txicas de la sangre. Reducir el riesgo de hemorragia de los vasos sanguneos agrandados en el estmago o el esfago (vrices). Trasplante de hgado. En este procedimiento, el hgado de un donante se [place] para reemplazar el hgado enfermo. Esto se realiza si la cirrosis ha ocasionado insuficiencia heptica. Otros tratamientos y procedimientos, segn los problemas que usted tenga debido a la cirrosis. Los problemas frecuentes incluyen insuficiencia renal relacionada con el hgado (sndrome hepatorenal). Siga estas instrucciones en su casa:  Tome los medicamentos solamente como se lo haya indicado el mdico. No use medicamentos que sean txicos para el hgado. Consulte al mdico antes de tomar algn [employer], incluidos los de [address], [place] los antiinflamatorios no esteroideos [place]). Descanse todo lo que sea necesario. Siga una  alimentacin bien equilibrada. Limite el consumo de sal o de agua, si el mdico le indica que lo haga. No beba alcohol. Esto es especialmente importante si toma paracetamol de manera habitual. Cumpla con todas las visitas de seguimiento. Esto es importante. Comunquese con un mdico si: Tiene fatiga o debilidad que empeora. Se le hinchan las manos, los pies o las piernas, o tiene acumulacin de lquido en el abdomen (ascitis). Tiene fiebre o escalofros. Pierde el apetito. Siente nuseas o vmitos. Tiene ictericia. Se le forman hematomas o sangra con facilidad. Solicite ayuda de inmediato si: Vomita sangre de color rojo brillante o una sustancia parecida a los granos de caf. Tiene [employer]. Nota que las heces son de color negro y de aspecto alquitranado. Comienza a sentirse confundido. Tiene dolor en el pecho o dificultad para respirar. Estos sntomas pueden representar un problema grave que constituye Radio broadcast assistant. No espere a ver si los sntomas desaparecen. Solicite atencin mdica de inmediato. Comunquese con el servicio de emergencias de su localidad (911 en los [place]). No conduzca por sus propios medios [employer]. Resumen La cirrosis es una lesin crnica en el hgado. Las causas ms comunes son la hepatitis C y el consumo prolongado de alcohol. Las pruebas para diagnosticar la cirrosis incluyen anlisis de [place], estudios de diagnstico por imgenes y biopsia del hgado. El tratamiento de la afeccin incluye el tratamiento de la afeccin preexistente. Evite el consumo de alcohol, drogas, sal y los medicamentos que pueden daar el hgado. Busque ayuda de inmediato si vomita sangre de color rojo brillante o una sustancia parecida a los granos de caf. Esta informacin no tiene Theme park manager el consejo del mdico. Asegrese de hacerle al mdico  cualquier pregunta que tenga. Document Revised: 06/23/2020 Document Reviewed: 06/23/2020 Elsevier Patient  Education  2023 Elsevier Inc.    Edwina Barth, MD [facility]

## 2022-09-08 NOTE — Assessment & Plan Note (Signed)
No recent episodes of GI bleed.  Stable.

## 2022-09-08 NOTE — Assessment & Plan Note (Signed)
Well-controlled diabetes with hemoglobin A1c of 5.9 Continue metformin 500 mg twice a day Diet and nutrition discussed

## 2022-09-08 NOTE — Patient Instructions (Signed)
Cirrosis Cirrhosis  La cirrosis es una lesin de largo plazo (crnica) en el hgado. El hgado es el rgano interno ms grande del cuerpo, y cumple muchas funciones. Este rgano transforma los [employer], elimina las sustancias txicas de la [place] protenas importantes y absorbe las vitaminas necesarias de los alimentos. En la cirrosis, las clulas hepticas son reemplazadas por tejido cicatricial. Esto impide que la sangre circule por el hgado y dificulta que el hgado realice sus funciones. Cules son las causas? Las causas ms comunes de la cirrosis son la hepatitis C y el consumo prolongado de alcohol. Otras causas son las siguientes: Enfermedad por hgado graso no alcohlico [facility]). Esto sucede cuando la grasa se deposita en el hgado por otras causas que no sean el alcohol. Infeccin por hepatitis B. Hepatitis autoinmune. En esta enfermedad, el sistema de defensa del cuerpo (sistema inmunitario) ataca por error las clulas del hgado, lo que causa inflamacin. Enfermedades que [employer] conductos dentro del hgado. Enfermedades hepticas hereditarias, como la hemocromatosis. Esta es una de las enfermedades hepticas hereditarias ms comunes. En esta enfermedad, depsitos de hierro se acumulan en el hgado y otros rganos. Reacciones a determinados medicamentos a Barrister's clerk, como la [place], un medicamento para Film/video editor. Infecciones por parsitos. Estas incluyen la esquistosomiasis, que es una enfermedad causada por un platelminto. Contacto prolongado con ciertas sustancias txicas. Estas sustancias txicas incluyen ciertos solventes orgnicos, tales como cloroformo y tolueno. Qu incrementa el riesgo? Es ms probable que sufra esta afeccin si: Tiene ciertos tipos de hepatitis viral. Consume alcohol en exceso, especialmente si es mujer. Tiene sobrepeso. [place] drogas intravenosas y comparte agujas. Tiene relaciones sexuales sin usar proteccin con alguien que  tiene una hepatitis viral. Cules son los signos o sntomas? Es posible que no tenga signos ni sntomas al principio. Puede que los sntomas no se manifiesten hasta que el dao heptico empiece a Copy. Entre los primeros sntomas, se pueden incluir los siguientes: Debilidad y International aid/development worker (fatiga). Cambios en los patrones de sueo o dificultad para dormir. Picazn. Dolor a Doctor, hospital parte superior derecha del abdomen. Adelgazamiento y prdida de masa muscular. Nuseas. Prdida del apetito. Entre los sntomas que aparecen en una etapa posterior se pueden incluir los siguientes: Fatiga o debilidad que [place]. Piel y ojos amarillos (ictericia). Acumulacin de lquido en el abdomen (ascitis). Puede notar que la ropa se le aprieta alrededor de la cintura. Aumento de peso e hinchazn de los pies y los tobillos (edema). Dificultad para respirar. Tener hematomas o hemorragias. Vomitar sangre o tener heces negras o con sangre. Confusin mental. Cmo se diagnostica? El mdico puede sospechar la presencia de cirrosis en funcin de los sntomas y la historia clnica, en especial si tiene otras enfermedades o antecedentes de consumo de alcohol. El [employer] har un examen fsico para palparle el hgado y buscar signos de cirrosis. Las pruebas pueden incluir las siguientes: Anlisis de sangre para Chief Technology Officer lo siguiente: Si tiene hepatitis B o C. La funcin renal. La funcin heptica. Pruebas de diagnstico por imgenes, por ejemplo: Resonancia magntica (RM) o exploracin por tomografa computarizada (TC) para buscar los cambios que se observan en los casos de cirrosis [place]. Ecografa para determinar si el tejido heptico normal est siendo reemplazado por tejido cicatricial. Un procedimiento en el que se [place] una aguja larga para tomar Truddie Coco de tejido heptico para estudiarlo en un laboratorio (biopsia). La biopsia de hgado puede confirmar el diagnstico de cirrosis. Cmo se  trata? [place] de  esta afeccin depende de la magnitud del dao heptico y qu lo caus. Puede incluir el tratamiento de los sntomas de la cirrosis o de las causas subyacentes, a fin de retrasar el avance del dao. El tratamiento puede incluir: Cambios de estilo de vida, como: Seguir una dieta saludable. Puede tener que consultar al mdico o a un nutricionista para elaborar un plan de alimentacin. Restringir la ingesta de sal. Mantener un peso saludable. No consumir drogas o alcohol. Tomar medicamentos para: Tratar las infecciones del hgado u otras infecciones. Controlar la picazn. Disminuir la acumulacin de lquido. Reducir ciertas sustancias txicas de la sangre. Reducir el riesgo de hemorragia de los vasos sanguneos agrandados en el estmago o el esfago (vrices). Trasplante de hgado. En este procedimiento, el hgado de un donante se utiliza para reemplazar el hgado enfermo. Esto se realiza si la cirrosis ha ocasionado insuficiencia heptica. Otros tratamientos y procedimientos, segn los problemas que usted tenga debido a la cirrosis. Los problemas frecuentes incluyen insuficiencia renal relacionada con el hgado (sndrome hepatorenal). Siga estas instrucciones en su casa:  Tome los medicamentos solamente como se lo haya indicado el mdico. No use medicamentos que sean txicos para el hgado. Consulte al mdico antes de tomar algn medicamento nuevo, incluidos los de venta libre, como los antiinflamatorios no esteroideos (AINE). Descanse todo lo que sea necesario. Siga una alimentacin bien equilibrada. Limite el consumo de sal o de agua, si el mdico le indica que lo haga. No beba alcohol. Esto es especialmente importante si toma paracetamol de manera habitual. Cumpla con todas las visitas de seguimiento. Esto es importante. Comunquese con un mdico si: Tiene fatiga o debilidad que empeora. Se le hinchan las manos, los pies o las piernas, o tiene acumulacin de lquido  en el abdomen (ascitis). Tiene fiebre o escalofros. Pierde el apetito. Siente nuseas o vmitos. Tiene ictericia. Se le forman hematomas o sangra con facilidad. Solicite ayuda de inmediato si: Vomita sangre de color rojo brillante o una sustancia parecida a los granos de caf. Tiene sangre en las heces. Nota que las heces son de color negro y de aspecto alquitranado. Comienza a sentirse confundido. Tiene dolor en el pecho o dificultad para respirar. Estos sntomas pueden representar un problema grave que constituye una emergencia. No espere a ver si los sntomas desaparecen. Solicite atencin mdica de inmediato. Comunquese con el servicio de emergencias de su localidad (911 en los [place]). No conduzca por sus propios medios hasta el hospital. Resumen La cirrosis es una lesin crnica en el hgado. Las causas ms comunes son la hepatitis C y el consumo prolongado de alcohol. Las pruebas para diagnosticar la cirrosis incluyen anlisis de sangre, estudios de diagnstico por imgenes y biopsia del hgado. El tratamiento de la afeccin incluye el tratamiento de la afeccin preexistente. Evite el consumo de alcohol, drogas, sal y los medicamentos que pueden daar el hgado. Busque ayuda de inmediato si vomita sangre de color rojo brillante o una sustancia parecida a los granos de caf. Esta informacin no tiene como fin reemplazar el consejo del mdico. Asegrese de hacerle al mdico cualquier pregunta que tenga. Document Revised: 06/23/2020 Document Reviewed: 06/23/2020 Elsevier Patient Education  2023 Elsevier Inc.  

## 2022-09-08 NOTE — Assessment & Plan Note (Signed)
Stable.  CBC done today. Continue ferrous sulfate 325 mg daily.

## 2022-09-09 ENCOUNTER — Telehealth: Payer: Self-pay | Admitting: Emergency Medicine

## 2022-09-09 DIAGNOSIS — N529 Male erectile dysfunction, unspecified: Secondary | ICD-10-CM

## 2022-09-09 NOTE — Telephone Encounter (Signed)
Patient called and stated he was seen in the office yesterday and told he was going to be given some medication and it would be sent to the [facility]. He does not remember what medicine it was supposed to be. He would like a callback to clarify at [phone].

## 2022-09-10 MED ORDER — SILDENAFIL CITRATE 100 MG PO TABS: 50.0000 mg | ORAL_TABLET | Freq: Every day | ORAL | 11 refills | Status: DC | PRN

## 2022-09-10 NOTE — Telephone Encounter (Signed)
Patient called back and states that the medication he was expecting to be refilled was the sildenafil (VIAGRA) 100 MG tablet  Requests that this be sent to [place] ([place] - [address]

## 2022-09-10 NOTE — Telephone Encounter (Signed)
New medication sent to requested pharmacy  

## 2022-09-10 NOTE — Telephone Encounter (Signed)
Called patient and left message for patient to call his GI provider to get those refills sent in to the pharmacy as his GI provider prescribed those medications

## 2022-09-24 ENCOUNTER — Other Ambulatory Visit: Payer: Self-pay

## 2022-09-24 ENCOUNTER — Telehealth: Payer: Self-pay

## 2022-09-24 DIAGNOSIS — I8511 Secondary esophageal varices with bleeding: Secondary | ICD-10-CM

## 2022-09-24 NOTE — Telephone Encounter (Signed)
Pt was scheduled for an EGD on 09/28/2022 at [facility] at 11:30 AM with Dr. Carlean Purl. Case ID number [id number]: Pt to arrive at 10:00 AM Pt made aware Prep Instructions were created for pt and through [place] pt was told the Instructions in details: Ambulatory referral to GI placed in EPIC  Pt verbalized understanding with all questions answered.

## 2022-09-28 ENCOUNTER — Ambulatory Visit (HOSPITAL_BASED_OUTPATIENT_CLINIC_OR_DEPARTMENT_OTHER): Payer: Self-pay | Admitting: Anesthesiology

## 2022-09-28 ENCOUNTER — Other Ambulatory Visit: Payer: Self-pay

## 2022-09-28 ENCOUNTER — Other Ambulatory Visit: Payer: Self-pay | Admitting: Internal Medicine

## 2022-09-28 ENCOUNTER — Emergency Department (HOSPITAL_COMMUNITY): Payer: Self-pay

## 2022-09-28 ENCOUNTER — Ambulatory Visit (HOSPITAL_COMMUNITY): Payer: Self-pay | Admitting: Anesthesiology

## 2022-09-28 ENCOUNTER — Ambulatory Visit (HOSPITAL_COMMUNITY)
Admission: RE | Admit: 2022-09-28 | Discharge: 2022-09-28 | Disposition: A | Discharge status: Home / Self Care | Payer: Self-pay | Attending: Internal Medicine | Admitting: Internal Medicine

## 2022-09-28 ENCOUNTER — Emergency Department (HOSPITAL_COMMUNITY)
Admission: EM | Admit: 2022-09-28 | Discharge: 2022-09-28 | Disposition: A | Discharge status: Home / Self Care | Payer: Self-pay | Attending: Emergency Medicine | Admitting: Emergency Medicine

## 2022-09-28 ENCOUNTER — Encounter (HOSPITAL_COMMUNITY)
Admission: RE | Disposition: A | Discharge status: Home / Self Care | Payer: Self-pay | Source: Home / Self Care | Attending: Internal Medicine

## 2022-09-28 ENCOUNTER — Encounter (HOSPITAL_COMMUNITY): Payer: Self-pay | Admitting: Internal Medicine

## 2022-09-28 DIAGNOSIS — Z7984 Long term (current) use of oral hypoglycemic drugs: Secondary | ICD-10-CM | POA: Insufficient documentation

## 2022-09-28 DIAGNOSIS — E1165 Type 2 diabetes mellitus with hyperglycemia: Secondary | ICD-10-CM | POA: Insufficient documentation

## 2022-09-28 DIAGNOSIS — E119 Type 2 diabetes mellitus without complications: Secondary | ICD-10-CM | POA: Insufficient documentation

## 2022-09-28 DIAGNOSIS — M25562 Pain in left knee: Secondary | ICD-10-CM | POA: Insufficient documentation

## 2022-09-28 DIAGNOSIS — I10 Essential (primary) hypertension: Secondary | ICD-10-CM | POA: Insufficient documentation

## 2022-09-28 DIAGNOSIS — I8510 Secondary esophageal varices without bleeding: Secondary | ICD-10-CM

## 2022-09-28 DIAGNOSIS — I8511 Secondary esophageal varices with bleeding: Secondary | ICD-10-CM

## 2022-09-28 DIAGNOSIS — E1149 Type 2 diabetes mellitus with other diabetic neurological complication: Secondary | ICD-10-CM

## 2022-09-28 DIAGNOSIS — K7460 Unspecified cirrhosis of liver: Secondary | ICD-10-CM | POA: Insufficient documentation

## 2022-09-28 DIAGNOSIS — M1990 Unspecified osteoarthritis, unspecified site: Secondary | ICD-10-CM

## 2022-09-28 DIAGNOSIS — K766 Portal hypertension: Secondary | ICD-10-CM

## 2022-09-28 DIAGNOSIS — I8500 Esophageal varices without bleeding: Secondary | ICD-10-CM

## 2022-09-28 DIAGNOSIS — Z79899 Other long term (current) drug therapy: Secondary | ICD-10-CM | POA: Insufficient documentation

## 2022-09-28 HISTORY — PX: ESOPHAGOGASTRODUODENOSCOPY (EGD) WITH PROPOFOL: SHX5813

## 2022-09-28 LAB — GLUCOSE, CAPILLARY: Glucose-Capillary: 143 mg/dL — ABNORMAL HIGH (ref 70–99)

## 2022-09-28 SURGERY — ESOPHAGOGASTRODUODENOSCOPY (EGD) WITH PROPOFOL
Anesthesia: Monitor Anesthesia Care

## 2022-09-28 MED ORDER — SPIRONOLACTONE 50 MG PO TABS: 50.0000 mg | ORAL_TABLET | Freq: Every day | ORAL | 1 refills | Status: DC

## 2022-09-28 MED ORDER — LACTATED RINGERS IV SOLN: INTRAVENOUS | Status: DC | PRN

## 2022-09-28 MED ORDER — LACTATED RINGERS IV SOLN
INTRAVENOUS | Status: DC
  Administered 2022-09-28: 1000 mL via INTRAVENOUS

## 2022-09-28 MED ORDER — PROPOFOL 1000 MG/100ML IV EMUL
INTRAVENOUS | Status: AC
  Filled 2022-09-28: qty 100

## 2022-09-28 MED ORDER — CARVEDILOL 3.125 MG PO TABS: 3.1250 mg | ORAL_TABLET | Freq: Two times a day (BID) | ORAL | 1 refills | Status: DC

## 2022-09-28 MED ORDER — DICLOFENAC SODIUM 1 % EX GEL: 2.0000 g | Freq: Four times a day (QID) | CUTANEOUS | 0 refills | Status: AC

## 2022-09-28 MED ORDER — FUROSEMIDE 20 MG PO TABS: 20.0000 mg | ORAL_TABLET | Freq: Every day | ORAL | 1 refills | Status: DC

## 2022-09-28 MED ORDER — PROPOFOL 10 MG/ML IV BOLUS
INTRAVENOUS | Status: DC | PRN
  Administered 2022-09-28: 50 mg via INTRAVENOUS

## 2022-09-28 MED ORDER — PROPOFOL 500 MG/50ML IV EMUL
INTRAVENOUS | Status: DC | PRN
  Administered 2022-09-28: 155 ug/kg/min via INTRAVENOUS

## 2022-09-28 MED ORDER — LIDOCAINE HCL (CARDIAC) PF 100 MG/5ML IV SOSY
PREFILLED_SYRINGE | INTRAVENOUS | Status: DC | PRN
  Administered 2022-09-28: 80 mg via INTRAVENOUS

## 2022-09-28 MED ORDER — SODIUM CHLORIDE 0.9 % IV SOLN: INTRAVENOUS | Status: DC

## 2022-09-28 SURGICAL SUPPLY — 15 items

## 2022-09-28 NOTE — Progress Notes (Signed)
Patient came into endo for outpatient EGD with 10/10 knee pain. Knee is swollen on assessment. Patient stated he wanted to go to the ED for his knee pain post procedure. Called Dr. Royce Macadamia MD and Dr. Carlean Purl MD. They are aware. Patient vitals stable in recovery and EGD went well. Patient transported to ED triage by this RN. Helped patient get checked in. Hand off given to ED triage RN.

## 2022-09-28 NOTE — Transfer of Care (Signed)
Immediate Anesthesia Transfer of Care Note  Patient: Wesley Williams  Procedure(s) Performed: ESOPHAGOGASTRODUODENOSCOPY (EGD) WITH PROPOFOL  Patient Location: PACU and Endoscopy Unit  Anesthesia Type:MAC  Level of Consciousness: awake  Airway & Oxygen Therapy: Patient Spontanous Breathing and Patient connected to face mask oxygen  Post-op Assessment: Report given to RN and Post -op Vital signs reviewed and stable  Post vital signs: Reviewed and stable  Last Vitals:  Vitals Value Taken Time  BP 129/89 09/28/22 1212  Temp    Pulse 78 09/28/22 1214  Resp 21 09/28/22 1214  SpO2 100 % 09/28/22 1214  Vitals shown include unvalidated device data.  Last Pain:  Vitals:   09/28/22 1053  TempSrc: Temporal  PainSc: 10-Worst pain ever         Complications: No notable events documented.

## 2022-09-28 NOTE — Anesthesia Postprocedure Evaluation (Addendum)
Anesthesia Post Note  Patient: Wesley Williams  Procedure(s) Performed: ESOPHAGOGASTRODUODENOSCOPY (EGD) WITH PROPOFOL     Patient location during evaluation: PACU Anesthesia Type: MAC Level of consciousness: awake and alert and oriented Pain management: pain level controlled Vital Signs Assessment: post-procedure vital signs reviewed and stable Respiratory status: spontaneous breathing, nonlabored ventilation and respiratory function stable Cardiovascular status: stable and blood pressure returned to baseline Postop Assessment: no apparent nausea or vomiting Anesthetic complications: no   No notable events documented.  Last Vitals:  Vitals:   09/28/22 1053  BP: (!) 143/92  Pulse: 70  Resp: (!) 24  Temp: 36.7 C  SpO2: 100%    Last Pain:  Vitals:   09/28/22 1053  TempSrc: Temporal  PainSc: 10-Worst pain ever                 Dallis Czaja A.

## 2022-09-28 NOTE — ED Triage Notes (Signed)
Pt arrives from outpatient EGD with left knee pain with swelling x 2 days.  Pt states has been seen for same in past and prescribed a cream

## 2022-09-28 NOTE — Discharge Instructions (Addendum)
Things look good today - the varices (enlarged veins) are treated and I did not need to put rubber bands on.  I am stopping nadolol and want you to change to carvedilol medication instead. We think that is a better treatment to reduce risk of bleeding again.  My office will contact you about an appoint to see me in March or April and also arrange an ultrasound test.  Will repeat this test in 1 year.  You should see if you can get Medicaid health insurance - that is easier to get now.  I appreciate the opportunity to care for you. [name], MD, [facility]

## 2022-09-28 NOTE — Discharge Instructions (Signed)
It was a pleasure taking care of you today.  As discussed, your x-ray shows severe arthritis.  I am sending you home with gel to help with pain.  I have included the number of the orthopedic surgeon.  Please call to schedule an appointment for further evaluation.  Continue to ice and elevate your left knee.  Return to the ER for new or worsening symptoms.

## 2022-09-28 NOTE — H&P (Addendum)
[facility] History and Physical   Primary Care Physician:  Horald Pollen, MD   Reason for Procedure:   Esophageal varices  Plan:    EGD, possible variceal banding     HPI: Wesley Williams is a 52 y.o. male here for surveillance of esophageal varices w/ a history of prior bleeding. Last banded 08/10/22   Past Medical History:  Diagnosis Date   Alcohol abuse    Cirrhosis ([place])    Hypertension    Portal hypertension (HCC)-colopathy    Rhabdomyolysis 04/23/2016   Secondary esophageal varices without bleeding (HCC)    Type 2 diabetes mellitus ([place])     Past Surgical History:  Procedure Laterality Date   COLONOSCOPY WITH PROPOFOL N/A 03/19/2020   Procedure: COLONOSCOPY WITH PROPOFOL;  Surgeon: [name], MD;  Location: [facility] ENDOSCOPY;  Service: Endoscopy;  Laterality: N/A;   ESOPHAGEAL BANDING  03/19/2020   Procedure: ESOPHAGEAL BANDING;  Surgeon: [name], MD;  Location: [facility];  Service: Endoscopy;;   ESOPHAGEAL BANDING N/A 04/07/2020   Procedure: ESOPHAGEAL BANDING;  Surgeon: [name], MD;  Location: [facility] ENDOSCOPY;  Service: Endoscopy;  Laterality: N/A;   ESOPHAGEAL BANDING N/A 05/19/2020   Procedure: ESOPHAGEAL BANDING;  Surgeon: [name], MD;  Location: [facility] ENDOSCOPY;  Service: Endoscopy;  Laterality: N/A;   ESOPHAGEAL BANDING  05/09/2022   Procedure: ESOPHAGEAL BANDING;  Surgeon: Daryel November, MD;  Location: [facility] ENDOSCOPY;  Service: Gastroenterology;;   ESOPHAGEAL BANDING  06/14/2022   Procedure: ESOPHAGEAL BANDING;  Surgeon: [name], MD;  Location: [facility] ENDOSCOPY;  Service: Gastroenterology;;   ESOPHAGEAL BANDING  08/10/2022   Procedure: ESOPHAGEAL BANDING;  Surgeon: [name], MD;  Location: [facility] ENDOSCOPY;  Service: Gastroenterology;;   ESOPHAGOGASTRODUODENOSCOPY (EGD) WITH PROPOFOL N/A 03/19/2020   Procedure: ESOPHAGOGASTRODUODENOSCOPY (EGD) WITH PROPOFOL;  Surgeon: [name], MD;  Location: [place];   Service: Endoscopy;  Laterality: N/A;   ESOPHAGOGASTRODUODENOSCOPY (EGD) WITH PROPOFOL N/A 04/07/2020   Procedure: ESOPHAGOGASTRODUODENOSCOPY (EGD) WITH PROPOFOL;  Surgeon: [name], MD;  Location: [facility] ENDOSCOPY;  Service: Endoscopy;  Laterality: N/A;   ESOPHAGOGASTRODUODENOSCOPY (EGD) WITH PROPOFOL N/A 05/19/2020   Procedure: ESOPHAGOGASTRODUODENOSCOPY (EGD) WITH PROPOFOL;  Surgeon: [name], MD;  Location: [facility] ENDOSCOPY;  Service: Endoscopy;  Laterality: N/A;   ESOPHAGOGASTRODUODENOSCOPY (EGD) WITH PROPOFOL N/A 05/09/2022   Procedure: ESOPHAGOGASTRODUODENOSCOPY (EGD) WITH PROPOFOL;  Surgeon: Daryel November, MD;  Location: [place];  Service: Gastroenterology;  Laterality: N/A;   ESOPHAGOGASTRODUODENOSCOPY (EGD) WITH PROPOFOL N/A 06/14/2022   Procedure: ESOPHAGOGASTRODUODENOSCOPY (EGD) WITH PROPOFOL;  Surgeon: [name], MD;  Location: [facility] ENDOSCOPY;  Service: Gastroenterology;  Laterality: N/A;   ESOPHAGOGASTRODUODENOSCOPY (EGD) WITH PROPOFOL N/A 08/10/2022   Procedure: ESOPHAGOGASTRODUODENOSCOPY (EGD) WITH PROPOFOL;  Surgeon: [name], MD;  Location: [facility] ENDOSCOPY;  Service: Gastroenterology;  Laterality: N/A;    Prior to Admission medications   Medication Sig Start Date End Date Taking? Authorizing Provider  acetaminophen (TYLENOL) 500 MG tablet Take 500 mg by mouth every 6 (six) hours as needed (pain).   Yes [provider]  ferrous sulfate 325 (65 FE) MG tablet Take 325 mg by mouth daily with breakfast.   Yes [provider]  furosemide (LASIX) 20 MG tablet Take 1 tablet (20 mg total) by mouth daily. 06/14/22  Yes [name], MD  metFORMIN (GLUCOPHAGE) 500 MG tablet Take 1 tablet (500 mg total) by mouth 2 (two) times daily with a meal. 05/13/22 09/25/23 Yes Dahal, Marlowe Aschoff, MD  nadolol (CORGARD)  20 MG tablet Take 1 tablet (20 mg total) by mouth daily. 06/14/22  Yes [name], MD  spironolactone (ALDACTONE) 50 MG tablet Take 1 tablet (50 mg total)  by mouth daily. 06/14/22  Yes [name], MD  sildenafil (VIAGRA) 100 MG tablet Take 0.5-1 tablets (50-100 mg total) by mouth daily as needed for erectile dysfunction. 09/10/22   Horald Pollen, MD    Current Facility-Administered Medications  Medication Dose Route Frequency Provider Last Rate Last Admin   lactated ringers infusion   Intravenous Continuous [name], MD 10 mL/hr at 09/28/22 1104 1,000 mL at 09/28/22 1104    Allergies as of 09/24/2022   (No Known Allergies)    Family History  Problem Relation Age of Onset   Diabetes Mother     Social History   Socioeconomic History   Marital status: Married    Spouse name: Not on file   Number of children: Not on file   Years of education: Not on file   Highest education level: Not on file  Occupational History   Not on file  Tobacco Use   Smoking status: Never   Smokeless tobacco: Never  Vaping Use   Vaping Use: Never used  Substance and Sexual Activity   Alcohol use: Not Currently    Comment: former etoh abuse, none since july 2021.   Drug use: No   Sexual activity: Yes  Other Topics Concern   Not on file  Social History Narrative   Single lives w/ long-term girlfriend   Works for a tree [employer]   Alcohol abuse hx   Never smoker, no drugs   Social Determinants of Health   Financial Resource Strain: Not on file  Food Insecurity: No Food Insecurity (05/12/2022)   Hunger Vital Sign    Worried About Running Out of Food in the Last Year: Never true    [place] in the Last Year: Never true  Transportation Needs: No Transportation Needs (05/12/2022)   PRAPARE - Hydrologist (Medical): No    Lack of Transportation (Non-Medical): No  Physical Activity: Not on file  Stress: Not on file  Social Connections: Not on file  Intimate Partner Violence: Not At Risk (05/12/2022)   Humiliation, Afraid, Rape, and Kick questionnaire    Fear of Current or Ex-Partner: No     Emotionally Abused: No    Physically Abused: No    Sexually Abused: No    Review of Systems: Having knee pain All other review of systems negative except as mentioned in the HPI.  Physical Exam: Vital signs BP (!) 143/92   Pulse 70   Temp 98 F (36.7 C) (Temporal)   Resp (!) 24   Ht 5\' 5"  (1.651 m)   Wt 84.4 kg   SpO2 100%   BMI 30.95 kg/m   General:   Alert,  Well-developed, well-nourished, pleasant and cooperative in NAD Lungs:  Clear throughout to auscultation.   Heart:  Regular rate and rhythm; no murmurs, clicks, rubs,  or gallops. Abdomen:  Soft, nontender and nondistended. Normal bowel sounds.   Neuro/Psych:  Alert and cooperative. Normal mood and affect. A and O x 3   @Kaeleen Odom  Simonne Maffucci, MD, [facility] [phone] (pager) 09/28/2022 11:52 AM@

## 2022-09-28 NOTE — Anesthesia Preprocedure Evaluation (Signed)
Anesthesia Evaluation  Patient identified by MRN, date of birth, ID band Patient awake    Reviewed: Allergy & Precautions, NPO status , Patient's Chart, lab work & pertinent test results, reviewed documented beta blocker date and time   History of Anesthesia Complications Negative for: history of anesthetic complications  Airway Mallampati: III  TM Distance: >3 FB Neck ROM: Full    Dental  (+) Dental Advisory Given, Missing, Chipped, Loose,    Pulmonary neg pulmonary ROS   breath sounds clear to auscultation       Cardiovascular hypertension, Pt. on medications and Pt. on home beta blockers (-) angina (-) Past MI  Rhythm:Regular  EKG 9/23 NSR   Neuro/Psych  Neuromuscular disease  negative psych ROS   GI/Hepatic negative GI ROS,,,(+) Cirrhosis   Esophageal Varices    Lab Results      Component                Value               Date                      ALT                      63 (H)              05/18/2022                AST                      38 (H)              05/18/2022                ALKPHOS                  110                 05/18/2022                BILITOT                  5.2 (H)             05/18/2022              Endo/Other  diabetes, Poorly Controlled, Type 2    Renal/GU Lab Results      Component                Value               Date                      CREATININE               0.49                05/18/2022                Musculoskeletal  (+) Arthritis , Osteoarthritis,    Abdominal   Peds  Hematology  (+) Blood dyscrasia, anemia Thrombocytopenia- Plt 62k   Anesthesia Other Findings Left ventricle: The cavity size was normal. Wall thickness was    increased in a pattern of mild LVH. Systolic function was normal.    The estimated ejection fraction was in the range of 60% to 65%.    Wall motion was normal; there were no regional  wall motion    abnormalities. Left ventricular diastolic  function parameters    were normal.  - Aortic valve: Moderately calcified annulus. Trileaflet;    moderately thickened leaflets. Valve area (VTI): 2.39 cm^2. Valve    area (Vmax): 2.26 cm^2. Valve area (Vmean): 2.4 cm^2.  - Mitral valve: Mildly calcified annulus. Mildly thickened leaflets    . There was mild regurgitation.  - Left atrium: The atrium was severely dilated.  - Atrial septum: No defect or patent foramen ovale was identified.  - Pulmonary arteries: Systolic pressure was moderately increased.    PA peak pressure: 35 mm Hg (S).  - Technically adequate study.   Reproductive/Obstetrics                              Anesthesia Physical Anesthesia Plan  ASA: 3  Anesthesia Plan: MAC   Post-op Pain Management: Minimal or no pain anticipated   Induction: Intravenous  PONV Risk Score and Plan: 1 and Propofol infusion, TIVA and Treatment may vary due to age or medical condition  Airway Management Planned: Nasal Cannula and Natural Airway  Additional Equipment: None  Intra-op Plan:   Post-operative Plan:   Informed Consent: I have reviewed the patients History and Physical, chart, labs and discussed the procedure including the risks, benefits and alternatives for the proposed anesthesia with the patient or authorized representative who has indicated his/her understanding and acceptance.     Dental advisory given  Plan Discussed with: CRNA and Anesthesiologist  Anesthesia Plan Comments:          Anesthesia Quick Evaluation

## 2022-09-28 NOTE — Op Note (Signed)
[facility] Patient Name: Wesley Williams Procedure Date: 09/28/2022 MRN: [id number] Attending MD: [name] , MD, [id number] Date of Birth: [birth year] CSN: [id number] Age: 52 Admit Type: Outpatient Procedure:                Upper GI endoscopy Indications:              Esophageal varices, Follow-up of esophageal varices                            w/ hx prior hemorrhage - last banding 12/23 Providers:                [name], MD, Doristine Johns, RN, Luan Moore, Technician Referring MD:              Medicines:                Monitored Anesthesia Care Complications:            No immediate complications. Estimated Blood Loss:     Estimated blood loss: none. Procedure:                Pre-Anesthesia Assessment:                           - Prior to the procedure, a History and Physical                            was performed, and patient medications and                            allergies were reviewed. The patient's tolerance of                            previous anesthesia was also reviewed. The risks                            and benefits of the procedure and the sedation                            options and risks were discussed with the patient.                            All questions were answered, and informed consent                            was obtained. Prior Anticoagulants: The patient has                            taken no anticoagulant or antiplatelet agents. ASA                            Grade Assessment: III - A patient with severe  systemic disease. After reviewing the risks and                            benefits, the patient was deemed in satisfactory                            condition to undergo the procedure.                           After obtaining informed consent, the endoscope was                            passed under direct vision. Throughout the                             procedure, the patient's blood pressure, pulse, and                            oxygen saturations were monitored continuously. The                            [id number]-[id number] ([id number]) Olympus endoscope was introduced                            through the mouth, and advanced to the body of the                            stomach. The upper GI endoscopy was accomplished                            without difficulty. The patient tolerated the                            procedure well. Scope In: Scope Out: Findings:      Grade I varices were found in the distal esophagus.      The cardia, gastric fundus and gastric body were normal. Impression:               - Grade I esophageal varices. Flat and no banding                            needed                           - Normal cardia, gastric fundus and gastric body.                            Including retroflexion - exam to boday                           - No specimens collected. Moderate Sedation:      Not Applicable - Patient had care per Anesthesia. Recommendation:           - Patient has a contact number available for  emergencies. The signs and symptoms of potential                            delayed complications were discussed with the                            patient. Return to normal activities tomorrow.                            Written discharge instructions were provided to the                            patient.                           - Resume previous diet.                           - Continue present medications.                           - Repeat upper endoscopy in 1 year for surveillance.                           - Return to GI clinic March/April 2024. My office                            to arrange also needs RUQ [place] in March                           - DC Nadolol and start carvediolol 3.125 mg bid Procedure Code(s):        --- Professional ---                           43200, Esophagoscopy,  flexible, transoral;                            diagnostic, including collection of specimen(s) by                            brushing or washing, when performed (separate                            procedure) Diagnosis Code(s):        --- Professional ---                           I85.00, Esophageal varices without bleeding CPT copyright 2022 American Medical Association. All rights reserved. The codes documented in this report are preliminary and upon coder review may  be revised to meet current compliance requirements. Iva Boop, MD 09/28/2022 12:22:01 PM This report has been signed electronically. Number of Addenda: 0

## 2022-09-28 NOTE — Progress Notes (Signed)
Patient went to ED for knee pain eval (pain was there prior to procedure)

## 2022-09-28 NOTE — ED Provider Notes (Signed)
[facility] Provider Note   CSN: [id number] Arrival date & time: 09/28/22  1259     History  Chief Complaint  Patient presents with   Knee Pain    Wesley Williams is a 52 y.o. male with a past medical history significant for portal hypertension, alcoholic cirrhosis, thrombocytopenia, diabetes, and remote alcohol abuse who presents to the ED due to worsening left knee pain and edema x 2 days.  History of same.  Denies history of gout.  No fever or chills.  No injury.  Denies numbness/tingling.  Pain worse with palpation and movement of knee.  History obtained from patient and past medical records. No interpreter used during encounter.       Home Medications Prior to Admission medications   Medication Sig Start Date End Date Taking? Authorizing Provider  diclofenac Sodium (VOLTAREN) 1 % GEL Apply 2 g topically 4 (four) times daily. 09/28/22  Yes Kimble Delaurentis, Druscilla Brownie, PA-C  acetaminophen (TYLENOL) 500 MG tablet Take 500 mg by mouth every 6 (six) hours as needed (pain).    [provider]  carvedilol (COREG) 3.125 MG tablet Take 1 tablet (3.125 mg total) by mouth 2 (two) times daily. 09/28/22 09/28/23  [name], MD  ferrous sulfate 325 (65 FE) MG tablet Take 325 mg by mouth daily with breakfast.    [provider]  furosemide (LASIX) 20 MG tablet Take 1 tablet (20 mg total) by mouth daily. 09/28/22   [name], MD  metFORMIN (GLUCOPHAGE) 500 MG tablet Take 1 tablet (500 mg total) by mouth 2 (two) times daily with a meal. 05/13/22 09/25/23  Dahal, Marlowe Aschoff, MD  sildenafil (VIAGRA) 100 MG tablet Take 0.5-1 tablets (50-100 mg total) by mouth daily as needed for erectile dysfunction. 09/10/22   Horald Pollen, MD  spironolactone (ALDACTONE) 50 MG tablet Take 1 tablet (50 mg total) by mouth daily. 09/28/22   [name], MD      Allergies    Patient has no known allergies.    Review of Systems   Review of Systems   Constitutional:  Negative for chills and fever.  Musculoskeletal:  Positive for arthralgias, gait problem and joint swelling.  Skin:  Negative for color change.  All other systems reviewed and are negative.   Physical Exam Updated Vital Signs BP (!) 143/106   Pulse 69   Temp 98.5 F (36.9 C)   Resp 18   SpO2 99%  Physical Exam Vitals and nursing note reviewed.  Constitutional:      General: He is not in acute distress.    Appearance: He is not ill-appearing.  HENT:     Head: Normocephalic.  Eyes:     Pupils: Pupils are equal, round, and reactive to light.  Cardiovascular:     Rate and Rhythm: Normal rate and regular rhythm.     Pulses: Normal pulses.     Heart sounds: Normal heart sounds. No murmur heard.    No friction rub. No gallop.  Pulmonary:     Effort: Pulmonary effort is normal.     Breath sounds: Normal breath sounds.  Abdominal:     General: Abdomen is flat. There is no distension.     Palpations: Abdomen is soft.     Tenderness: There is no abdominal tenderness. There is no guarding or rebound.  Musculoskeletal:        General: Swelling and tenderness present. Normal range of motion.  Cervical back: Neck supple.     Comments: Left knee edema and tenderness. Decreased ROM secondary to pain. No erythema or warmth.   Skin:    General: Skin is warm and dry.  Neurological:     General: No focal deficit present.     Mental Status: He is alert.  Psychiatric:        Mood and Affect: Mood normal.        Behavior: Behavior normal.     ED Results / Procedures / Treatments   Labs (all labs ordered are listed, but only abnormal results are displayed) Labs Reviewed - No data to display  EKG None  Radiology DG Knee Complete 4 Views Left  Result Date: 09/28/2022 CLINICAL DATA:  Edema.  Left knee pain and swelling for 2 days. EXAM: LEFT KNEE - COMPLETE 4+ VIEW COMPARISON:  Left knee radiographs 06/15/2021 FINDINGS: Severe medial compartment joint space  narrowing, subchondral sclerosis, and peripheral osteophytosis. Mild lateral compartment chondrocalcinosis and peripheral osteophytosis. Moderate peripheral patellar degenerative osteophytes. Moderate joint effusion, increased from prior. No acute fracture is seen. No dislocation. Moderate vascular calcifications. IMPRESSION: 1. 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 2. Moderate joint effusion, increased from prior. Electronically Signed   By: Yvonne Kendall M.D.   On: 09/28/2022 13:51    Procedures Procedures    Medications Ordered in ED Medications - No data to display  ED Course/ Medical Decision Making/ A&P                             Medical Decision Making Amount and/or Complexity of Data Reviewed Radiology: ordered and independent interpretation performed. Decision-making details documented in ED Course.   52 year old male presents to the ED due to left knee pain and edema x 2 days.  History of same.  History of remote alcohol abuse.  No history of gout.  No injury. History of alcoholic cirrhosis. He notes in the past, he was given topical NSAIDs and knee sleeve with resolution in symptoms. Upon arrival, patient afebrile, not tachycardic or hypoxic.  Patient in no acute distress.  Physical exam significant for edematous and tender left knee.  No erythema or warmth.  Decreased range of motion secondary to pain.  Lower suspicion for septic joint.  X-ray personally reviewed and interpreted which demonstrates severe patellofemoral compartment OA.  Moderate joint effusion.  Patient placed in knee sleeve and discharged with NSAID gel.  No oral NSAIDs given history of cirrhosis.  Orthopedics number given to patient at discharge and advised to follow-up for further evaluation. Strict ED precautions discussed with patient. Patient states understanding and agrees to plan. Patient discharged home in no acute distress and stable vitals  Has  PCP       Final Clinical Impression(s) / ED Diagnoses Final diagnoses:  Acute pain of left knee    Rx / DC Orders ED Discharge Orders          Ordered    diclofenac Sodium (VOLTAREN) 1 % GEL  4 times daily        09/28/22 1359              Suzy Bouchard, PA-C 09/28/22 1402    Lorelle Gibbs, DO 09/28/22 1544

## 2022-09-29 ENCOUNTER — Telehealth: Payer: Self-pay

## 2022-09-29 ENCOUNTER — Encounter (HOSPITAL_COMMUNITY): Payer: Self-pay | Admitting: Internal Medicine

## 2022-09-29 NOTE — Telephone Encounter (Signed)
Transition Care Management Follow-up Telephone Call Date of discharge and from where: [facility] [place] [facility] 09-28-22 Dx: acute left knee pain  How have you been since you were released from the hospital? Doing a little better  Any questions or concerns? No  Items Reviewed: Did the pt receive and understand the discharge instructions provided? Yes  Medications obtained and verified? Yes  Other? No  Any new allergies since your discharge? No  Dietary orders reviewed? Yes Do you have support at home? Yes   Home Care and Equipment/Supplies: Were home health services ordered? no If so, what is the name of the agency? na  Has the agency set up a time to come to the patient's home? not applicable Were any new equipment or medical supplies ordered?  No What is the name of the medical supply agency? na Were you able to get the supplies/equipment? not applicable Do you have any questions related to the use of the equipment or supplies? No  Functional Questionnaire: (I = Independent and D = Dependent) ADLs: I  Bathing/Dressing- I  Meal Prep- I  Eating- I  Maintaining continence- I  Transferring/Ambulation- I  Managing Meds- I  Follow up appointments reviewed:  PCP Hospital f/u appt confirmed? No  Patient plans to fu with orthopaedic. [facility] f/u appt confirmed? Yes  Scheduled to see Dr Erlinda Hong on 10-14-22 @ 10am. Are transportation arrangements needed? No  If their condition worsens, is the pt aware to call PCP or go to the Emergency Dept.? Yes Was the patient provided with contact information for the PCP's office or ED? Yes Was to pt encouraged to call back with questions or concerns? Yes   Juanda Crumble LPN [facility] [phone]

## 2022-10-08 ENCOUNTER — Telehealth: Payer: Self-pay | Admitting: Emergency Medicine

## 2022-10-08 DIAGNOSIS — E1165 Type 2 diabetes mellitus with hyperglycemia: Secondary | ICD-10-CM

## 2022-10-08 MED ORDER — METFORMIN HCL 500 MG PO TABS: 500.0000 mg | ORAL_TABLET | Freq: Two times a day (BID) | ORAL | 0 refills | Status: DC

## 2022-10-08 NOTE — Telephone Encounter (Signed)
Caller & Relationship to patient: Self  Call back number: [phone]   Date of last office visit: 1.3.24  Date of next office visit: 7.8.24  Medication(s) to be refilled:  metFORMIN (GLUCOPHAGE) 500 MG tablet   Preferred Pharmacy:  [facility]   Phone: [phone]  Fax: [phone]

## 2022-10-08 NOTE — Telephone Encounter (Signed)
New medication sent to patient pharmacy  

## 2022-10-13 NOTE — Progress Notes (Unsigned)
Office Visit Note   Patient: Wesley Williams           Date of Birth: Oct 22, 1970           MRN: [id number] Visit Date: 10/14/2022              Requested by: Horald Pollen, MD [place],  [place] [id number] PCP: Horald Pollen, MD   Assessment & Plan: Visit Diagnoses:  1. Primary osteoarthritis of left knee     Plan: Impression is severe left knee osteoarthritis with varus deformity.  Medial compartment is bone-on-bone.  X-rays and disease process reviewed and discussed and treatment options explained.  Based on his options he would like to try cortisone injection.  He will continue to take over-the-counter medications as needed.  We briefly spoke about knee replacement surgery as a more definitive solution.  Follow-Up Instructions: No follow-ups on file.   Orders:  No orders of the defined types were placed in this encounter.  No orders of the defined types were placed in this encounter.     Procedures: Large Joint Inj: L knee on 10/14/2022 10:28 AM Details: 22 G needle Medications: 2 mL bupivacaine 0.5 %; 2 mL lidocaine 1 %; 40 mg methylPREDNISolone acetate 40 MG/ML Outcome: tolerated well, no immediate complications Patient was prepped and draped in the usual sterile fashion.       Clinical Data: No additional findings.   Subjective: No chief complaint on file.   HPI Wesley Williams is a 52 year old gentleman here for severe left knee pain for 4 years has gotten worse.  Has started to notice a varus deformity as well.  Denies any injuries or previous surgeries.  He has pain throughout the knee. Review of Systems  Constitutional: Negative.   HENT: Negative.    Eyes: Negative.   Respiratory: Negative.    Cardiovascular: Negative.   Gastrointestinal: Negative.   Endocrine: Negative.   Genitourinary: Negative.   Skin: Negative.   Allergic/Immunologic: Negative.   Neurological: Negative.   Hematological: Negative.   Psychiatric/Behavioral: Negative.     All other systems reviewed and are negative.   Objective: Vital Signs: There were no vitals taken for this visit.  Physical Exam Vitals and nursing note reviewed.  Constitutional:      Appearance: He is well-developed.  HENT:     Head: Normocephalic and atraumatic.  Eyes:     Pupils: Pupils are equal, round, and reactive to light.  Pulmonary:     Effort: Pulmonary effort is normal.  Abdominal:     Palpations: Abdomen is soft.  Musculoskeletal:        General: Normal range of motion.     Cervical back: Neck supple.  Skin:    General: Skin is warm.  Neurological:     Mental Status: He is alert and oriented to person, place, and time.  Psychiatric:        Behavior: Behavior normal.        Thought Content: Thought content normal.        Judgment: Judgment normal.   Ortho Exam Examination left knee shows varus deformity.  Medial joint line tenderness.  Patellofemoral crepitus with range of motion.  Causing cruciates are stable.  Pain throughout range of motion. Specialty Comments:  No specialty comments available.  Imaging: No results found.   PMFS History: Patient Active Problem List   Diagnosis Date Noted   Thrombocytopenia ([place]) 09/08/2022   Symptomatic anemia 05/08/2022   Type 2 diabetes mellitus  with hyperglycemia, without long-term current use of insulin ([place]) 12/28/2021   Hyperglycemia 06/29/2021   Secondary esophageal varices without bleeding (H[facility])    Rectal varices    Portal hypertension (HCC)-colopathy    Alcoholic cirrhosis of liver without ascites ([place]) 03/18/2020   Pancytopenia ([place])    Anemia 12/12/2017   Erectile dysfunction 12/12/2017   Alcohol abuse 04/23/2016   Past Medical History:  Diagnosis Date   Alcohol abuse    Cirrhosis ([place])    Hypertension    Portal hypertension (HCC)-colopathy    Rhabdomyolysis 04/23/2016   Secondary esophageal varices without bleeding (HCC)    Type 2 diabetes mellitus ([place])     Family History  Problem  Relation Age of Onset   Diabetes Mother     Past Surgical History:  Procedure Laterality Date   COLONOSCOPY WITH PROPOFOL N/A 03/19/2020   Procedure: COLONOSCOPY WITH PROPOFOL;  Surgeon: [name], MD;  Location: [facility] ENDOSCOPY;  Service: Endoscopy;  Laterality: N/A;   ESOPHAGEAL BANDING  03/19/2020   Procedure: ESOPHAGEAL BANDING;  Surgeon: [name], MD;  Location: [place];  Service: Endoscopy;;   ESOPHAGEAL BANDING N/A 04/07/2020   Procedure: ESOPHAGEAL BANDING;  Surgeon: [name], MD;  Location: [facility] ENDOSCOPY;  Service: Endoscopy;  Laterality: N/A;   ESOPHAGEAL BANDING N/A 05/19/2020   Procedure: ESOPHAGEAL BANDING;  Surgeon: [name], MD;  Location: [facility] ENDOSCOPY;  Service: Endoscopy;  Laterality: N/A;   ESOPHAGEAL BANDING  05/09/2022   Procedure: ESOPHAGEAL BANDING;  Surgeon: Daryel November, MD;  Location[facility] END[facility]COPY;  Service: Gastroenterology;;   ESOPHAGEAL BANDING  06/14/2022   Procedure: ESOPHAGEAL BANDING;  Surgeon: [name], MD;  Location: [facility];  Service: Gastroenterology;;   ESOPHAGEAL BANDING  08/10/2022   Procedure: ESOPHAGEAL BANDING;  Surgeon: [name], MD;  Location: [facility];  Service: Gastroenterology;;   ESOPHAGOGASTRODUODENOSCOPY (EGD) WITH PROPOFOL N/A 03/19/2020   Procedure: ESOPHAGOGASTRODUODENOSCOPY (EGD) WITH PROPOFOL;  Surgeon: [name], MD;  Location: [place];  Service: Endoscopy;  Laterality: N/A;   ESOPHAGOGASTRODUODENOSCOPY (EGD) WITH PROPOFOL N/A 04/07/2020   Procedure: ESOPHAGOGASTRODUODENOSCOPY (EGD) WITH PROPOFOL;  Surgeon: [name], MD;  Location: [facility] ENDOSCOPY;  Service: Endoscopy;  Laterality: N/A;   ESOPHAGOGASTRODUODENOSCOPY (EGD) WITH PROPOFOL N/A 05/19/2020   Procedure: ESOPHAGOGASTRODUODENOSCOPY (EGD) WITH PROPOFOL;  Surgeon: [name], MD;  Location: [facility] ENDOSCOPY;  Service: Endoscopy;  Laterality: N/A;   ESOPHAGOGASTRODUODENOSCOPY (EGD) WITH PROPOFOL N/A 05/09/2022   Procedure:  ESOPHAGOGASTRODUODENOSCOPY (EGD) WITH PROPOFOL;  Surgeon: Daryel November, MD;  Location: [place];  Service: Gastroenterology;  Laterality: N/A;   ESOPHAGOGASTRODUODENOSCOPY (EGD) WITH PROPOFOL N/A 06/14/2022   Procedure: ESOPHAGOGASTRODUODENOSCOPY (EGD) WITH PROPOFOL;  Surgeon: [name], MD;  Location: [facility] ENDOSCOPY;  Service: Gastroenterology;  Laterality: N/A;   ESOPHAGOGASTRODUODENOSCOPY (EGD) WITH PROPOFOL N/A 08/10/2022   Procedure: ESOPHAGOGASTRODUODENOSCOPY (EGD) WITH PROPOFOL;  Surgeon: [name], MD;  Location: [facility] ENDOSCOPY;  Service: Gastroenterology;  Laterality: N/A;   ESOPHAGOGASTRODUODENOSCOPY (EGD) WITH PROPOFOL N/A 09/28/2022   Procedure: ESOPHAGOGASTRODUODENOSCOPY (EGD) WITH PROPOFOL;  Surgeon: [name], MD;  Location: [facility] ENDOSCOPY;  Service: Gastroenterology;  Laterality: N/A;   Social History   Occupational History   Not on file  Tobacco Use   Smoking status: Never   Smokeless tobacco: Never  Vaping Use   Vaping Use: Never used  Substance and Sexual Activity   Alcohol use: Not Currently    Comment: former etoh abuse, none since july 2021.   Drug use: No   Sexual activity:  Yes

## 2022-10-14 ENCOUNTER — Encounter: Payer: Self-pay | Admitting: Orthopaedic Surgery

## 2022-10-14 ENCOUNTER — Ambulatory Visit (INDEPENDENT_AMBULATORY_CARE_PROVIDER_SITE_OTHER): Payer: Self-pay | Admitting: Orthopaedic Surgery

## 2022-10-14 DIAGNOSIS — M1712 Unilateral primary osteoarthritis, left knee: Secondary | ICD-10-CM

## 2022-10-14 MED ORDER — METHYLPREDNISOLONE ACETATE 40 MG/ML IJ SUSP
40.0000 mg | INTRAMUSCULAR | Status: AC | PRN
  Administered 2022-10-14: 40 mg via INTRA_ARTICULAR

## 2022-10-14 MED ORDER — LIDOCAINE HCL 1 % IJ SOLN
2.0000 mL | INTRAMUSCULAR | Status: AC | PRN
  Administered 2022-10-14: 2 mL

## 2022-10-14 MED ORDER — BUPIVACAINE HCL 0.5 % IJ SOLN
2.0000 mL | INTRAMUSCULAR | Status: AC | PRN
  Administered 2022-10-14: 2 mL via INTRA_ARTICULAR

## 2023-01-06 ENCOUNTER — Telehealth: Payer: Self-pay | Admitting: Emergency Medicine

## 2023-01-06 DIAGNOSIS — E1165 Type 2 diabetes mellitus with hyperglycemia: Secondary | ICD-10-CM

## 2023-01-06 MED ORDER — METFORMIN HCL 500 MG PO TABS: 500.0000 mg | ORAL_TABLET | Freq: Two times a day (BID) | ORAL | 0 refills | Status: DC

## 2023-01-06 NOTE — Telephone Encounter (Signed)
Caller & Relationship to patient: James - patient  Call back number: [phone]  Date of last office visit: 09-08-22  Date of next office visit: 03-14-23  Medication(s) to be refilled: metFORMIN (GLUCOPHAGE) 500 MG tablet   Preferred Pharmacy: [id number] - [place] ([place]), [place] - [address]

## 2023-02-03 ENCOUNTER — Telehealth: Payer: Self-pay | Admitting: Emergency Medicine

## 2023-02-03 NOTE — Telephone Encounter (Signed)
Pt called wanting to see if Dr. Alvy Bimler can speak with him about these two medication . Pt said he called to the hospital but they told him he has to get the medication from his primary care Dr. Please advise furosemide (LASIX) 20 MG tablet   spironolactone (ALDACTONE) 50 MG tablet

## 2023-02-04 NOTE — Telephone Encounter (Signed)
Called patient and left message for patient to call office back to schedule an appt to talk with provider regarding medication question

## 2023-02-09 ENCOUNTER — Ambulatory Visit: Payer: Self-pay | Admitting: Emergency Medicine

## 2023-02-16 IMAGING — DX DG KNEE COMPLETE 4+V*L*
4 series · 4 of 4 positions shown · non-contrast
Comparison: Knee radiographs 04/29/2016

CLINICAL DATA: Knee pain with swelling since [REDACTED]

EXAM:
LEFT KNEE - COMPLETE 4+ VIEW

[knee ap]
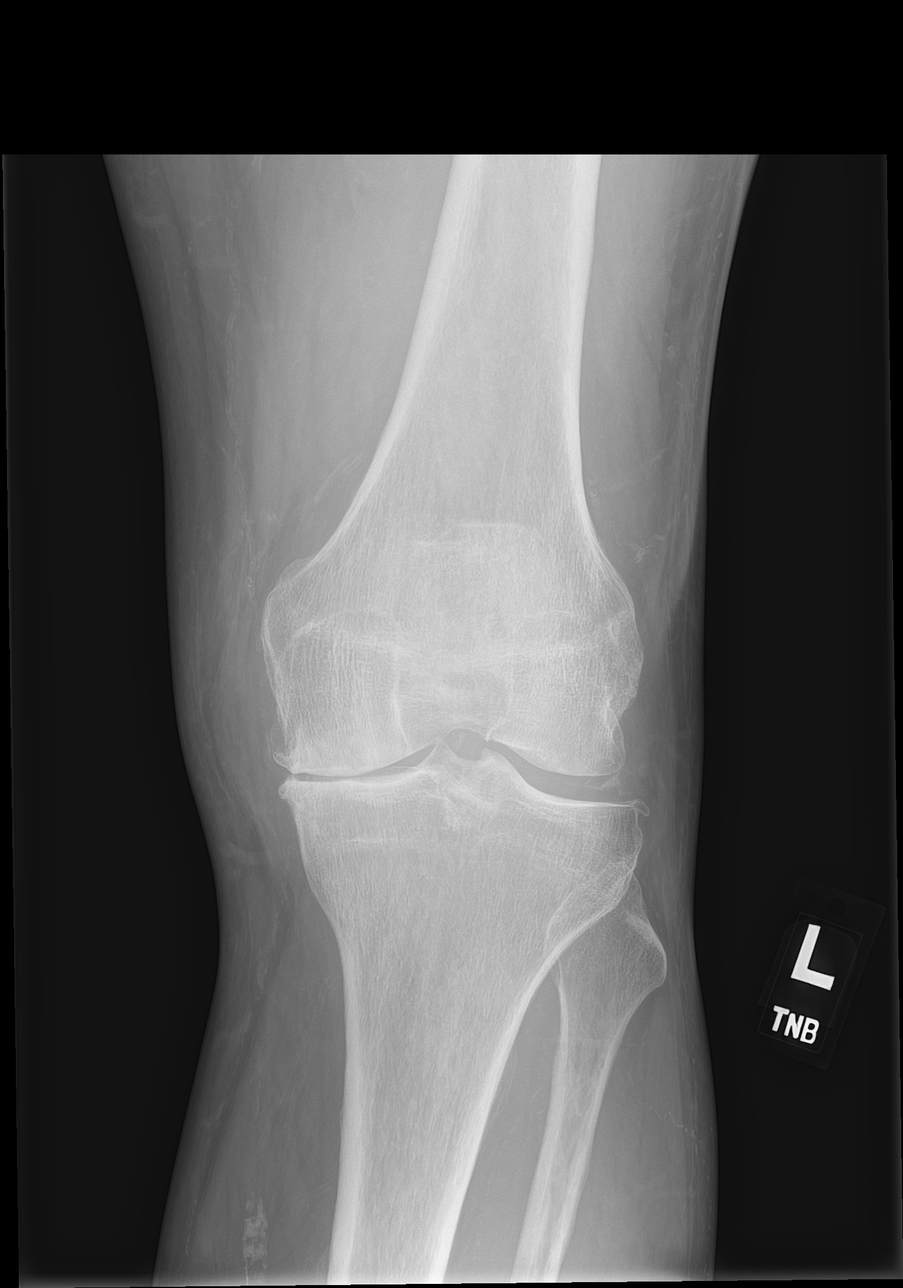

[knee lat]
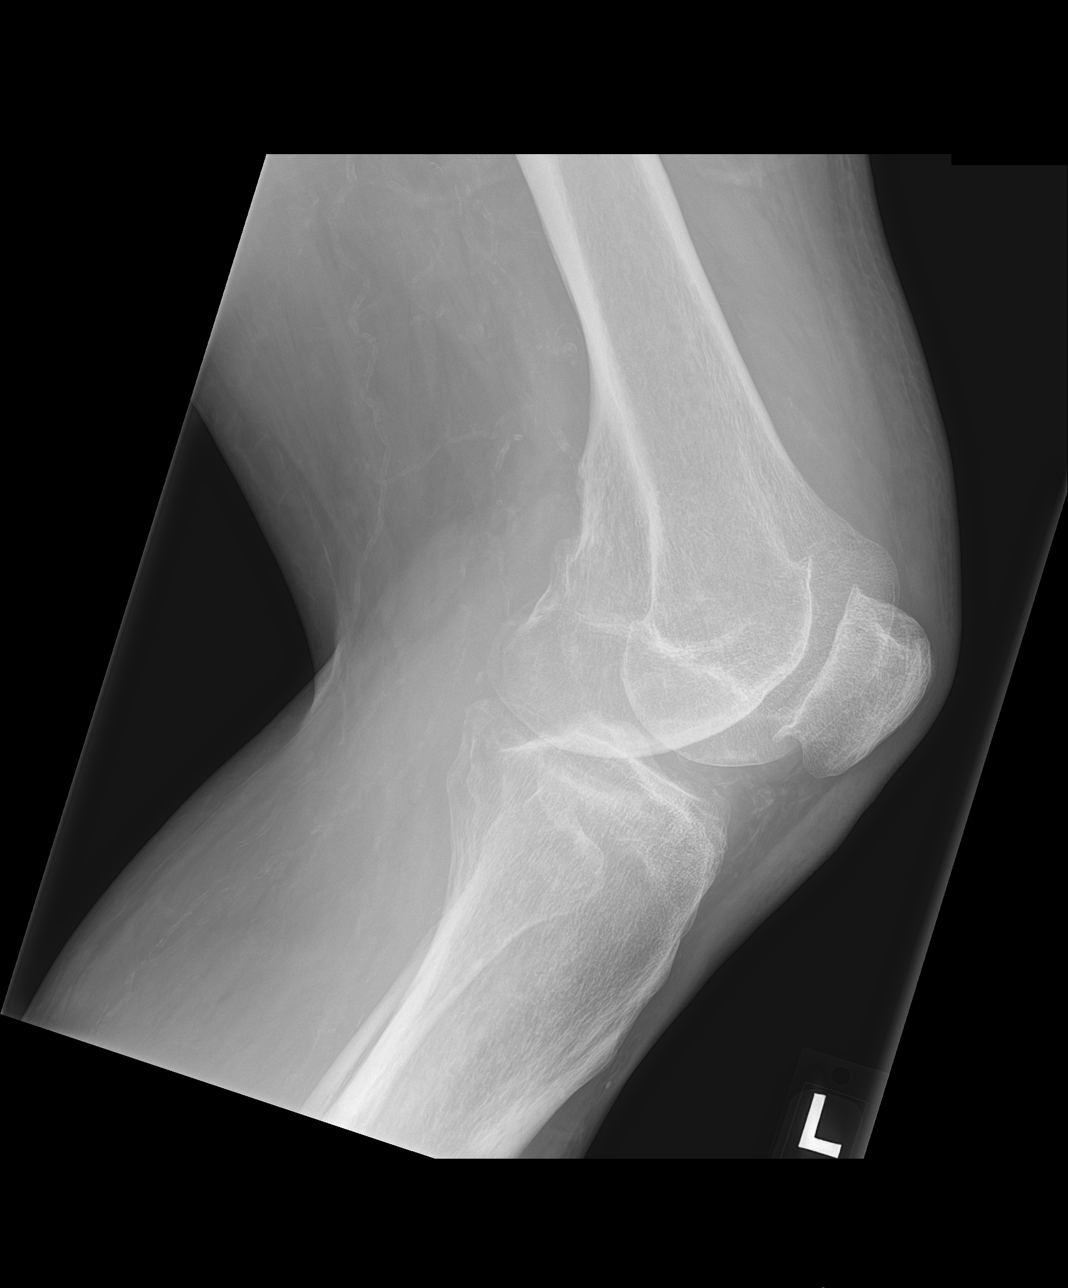

[knee obl (1 of 2)]
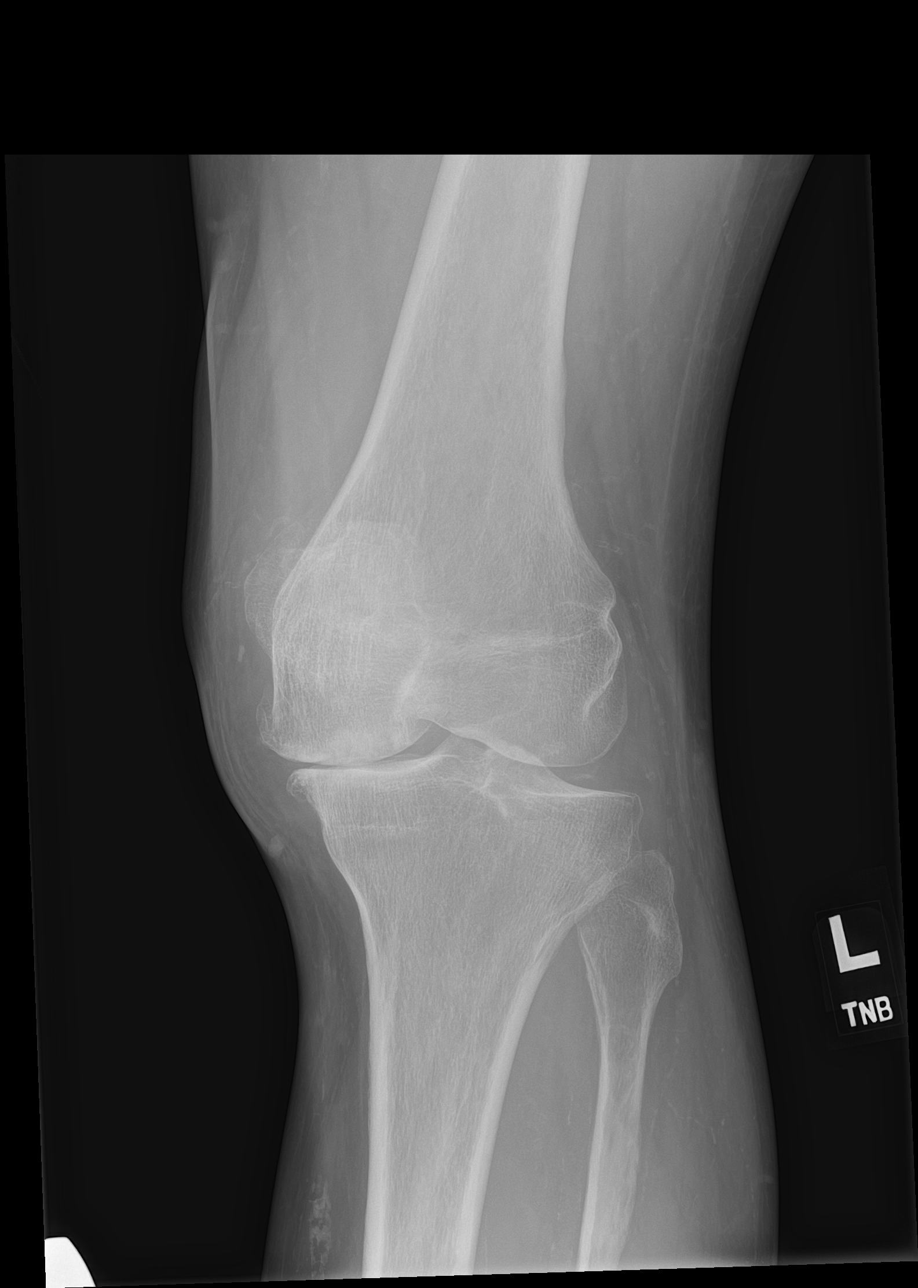

[knee obl (2 of 2)]
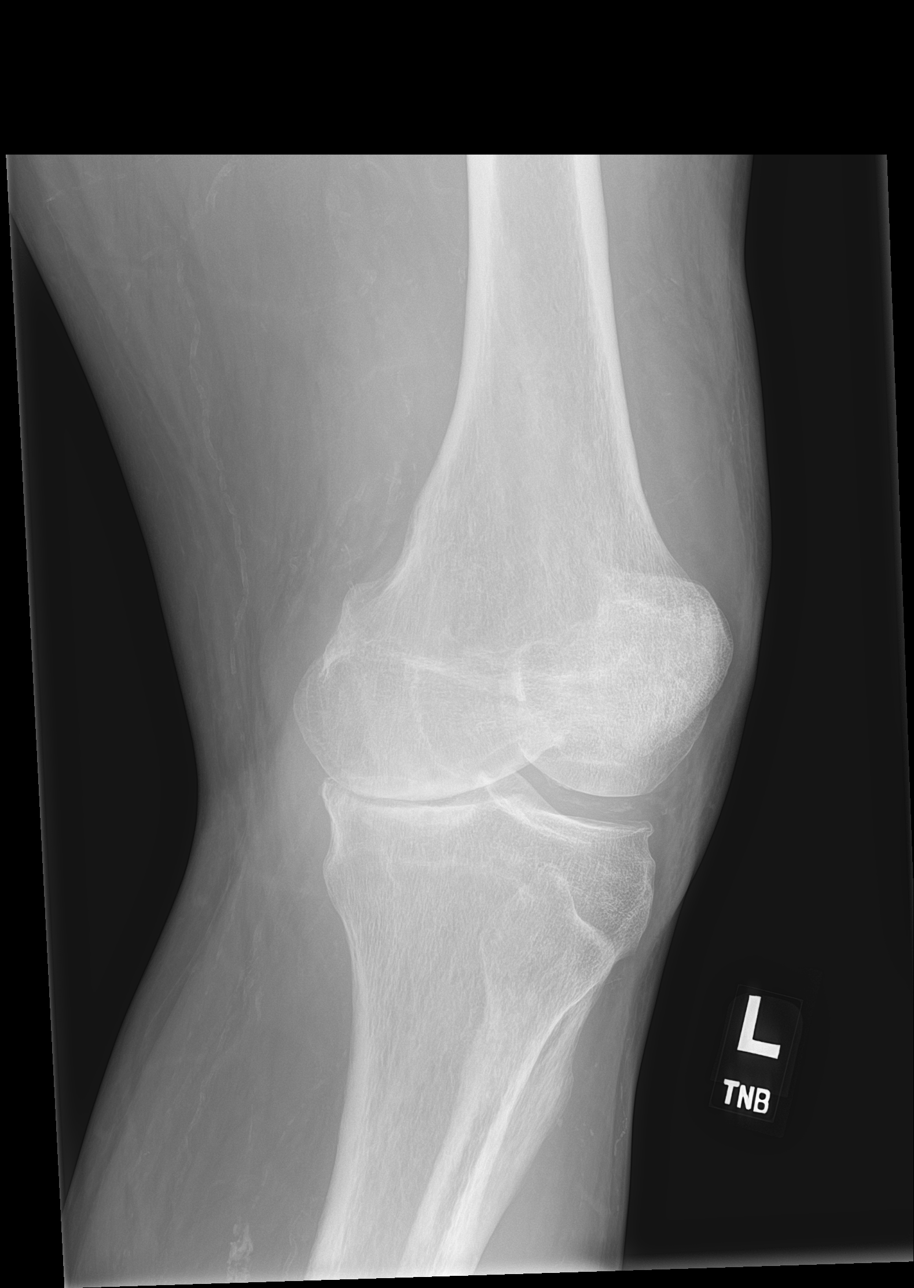

[4 of 4 positions shown; findings below may reference images not displayed]

FINDINGS: There is no acute fracture or dislocation. Knee alignment is normal.

There is severe medial and mild lateral tibiofemoral joint space
narrowing with associated subchondral sclerosis and osteophytosis,
consistent with osteoarthritis, progressed since 8433. There is
chondrocalcinosis in the lateral compartment. There is no definite
effusion.
IMPRESSION: 1. No acute fracture or dislocation.
2. Severe joint space narrowing involving the medial compartment
with associated subchondral sclerosis and osteophytosis, progressed
since [DATE]. Chondrocalcinosis.

## 2023-02-28 ENCOUNTER — Ambulatory Visit: Payer: Self-pay | Admitting: Emergency Medicine

## 2023-03-07 ENCOUNTER — Ambulatory Visit: Payer: Self-pay | Admitting: Emergency Medicine

## 2023-03-14 ENCOUNTER — Ambulatory Visit: Payer: Self-pay | Admitting: Emergency Medicine

## 2023-03-21 ENCOUNTER — Encounter: Payer: Self-pay | Admitting: Emergency Medicine

## 2023-03-21 ENCOUNTER — Ambulatory Visit (INDEPENDENT_AMBULATORY_CARE_PROVIDER_SITE_OTHER): Payer: Self-pay | Admitting: Emergency Medicine

## 2023-03-21 VITALS — BP 138/74 | HR 65 | Temp 98.0°F | Ht 65.0 in | Wt 192.4 lb

## 2023-03-21 DIAGNOSIS — K766 Portal hypertension: Secondary | ICD-10-CM

## 2023-03-21 DIAGNOSIS — K7030 Alcoholic cirrhosis of liver without ascites: Secondary | ICD-10-CM

## 2023-03-21 DIAGNOSIS — E1165 Type 2 diabetes mellitus with hyperglycemia: Secondary | ICD-10-CM

## 2023-03-21 DIAGNOSIS — I8510 Secondary esophageal varices without bleeding: Secondary | ICD-10-CM

## 2023-03-21 DIAGNOSIS — Z7984 Long term (current) use of oral hypoglycemic drugs: Secondary | ICD-10-CM

## 2023-03-21 DIAGNOSIS — D696 Thrombocytopenia, unspecified: Secondary | ICD-10-CM

## 2023-03-21 DIAGNOSIS — D61818 Other pancytopenia: Secondary | ICD-10-CM

## 2023-03-21 LAB — COMPREHENSIVE METABOLIC PANEL
ALT: 28 U/L (ref 0–53)
AST: 32 U/L (ref 0–37)
Albumin: 3.6 g/dL (ref 3.5–5.2)
Alkaline Phosphatase: 83 U/L (ref 39–117)
BUN: 11 mg/dL (ref 6–23)
CO2: 26 mEq/L (ref 19–32)
Calcium: 9.5 mg/dL (ref 8.4–10.5)
Chloride: 102 mEq/L (ref 96–112)
Creatinine, Ser: 0.6 mg/dL (ref 0.40–1.50)
GFR: 110.41 mL/min (ref >60.00)
Glucose, Bld: 197 mg/dL — ABNORMAL HIGH (ref 70–99)
Potassium: 4.5 mEq/L (ref 3.5–5.1)
Sodium: 138 mEq/L (ref 135–145)
Total Bilirubin: 1.5 mg/dL — ABNORMAL HIGH (ref 0.2–1.2)
Total Protein: 7.9 g/dL (ref 6.0–8.3)

## 2023-03-21 LAB — CBC WITH DIFFERENTIAL/PLATELET
Basophils Absolute: 0 10*3/uL (ref 0.0–0.1)
Basophils Relative: 1.2 % (ref 0.0–3.0)
Eosinophils Absolute: 0.1 10*3/uL (ref 0.0–0.7)
Eosinophils Relative: 3.5 % (ref 0.0–5.0)
HCT: 40.7 % (ref 39.0–52.0)
Hemoglobin: 14.1 g/dL (ref 13.0–17.0)
Lymphocytes Relative: 11.6 % — ABNORMAL LOW (ref 12.0–46.0)
Lymphs Abs: 0.4 10*3/uL — ABNORMAL LOW (ref 0.7–4.0)
MCHC: 34.6 g/dL (ref 30.0–36.0)
MCV: 95.7 fl (ref 78.0–100.0)
Monocytes Absolute: 0.4 10*3/uL (ref 0.1–1.0)
Monocytes Relative: 11.9 % (ref 3.0–12.0)
Neutro Abs: 2.6 10*3/uL (ref 1.4–7.7)
Neutrophils Relative %: 71.8 % (ref 43.0–77.0)
Platelets: 56 10*3/uL — ABNORMAL LOW (ref 150.0–400.0)
RBC: 4.26 Mil/uL (ref 4.22–5.81)
RDW: 14.9 % (ref 11.5–15.5)
WBC: 3.6 10*3/uL — ABNORMAL LOW (ref 4.0–10.5)

## 2023-03-21 LAB — POCT GLYCOSYLATED HEMOGLOBIN (HGB A1C): Hemoglobin A1C: 5.7 % — AB (ref 4.0–5.6)

## 2023-03-21 LAB — LIPID PANEL
Cholesterol: 159 mg/dL (ref 0–200)
HDL: 49.7 mg/dL (ref >39.00)
LDL Cholesterol: 93 mg/dL (ref 0–99)
NonHDL: 109.38
Total CHOL/HDL Ratio: 3
Triglycerides: 83 mg/dL (ref 0.0–149.0)
VLDL: 16.6 mg/dL (ref 0.0–40.0)

## 2023-03-21 MED ORDER — FUROSEMIDE 20 MG PO TABS: 20.0000 mg | ORAL_TABLET | Freq: Every day | ORAL | 3 refills | Status: AC

## 2023-03-21 MED ORDER — CARVEDILOL 3.125 MG PO TABS: 3.1250 mg | ORAL_TABLET | Freq: Two times a day (BID) | ORAL | 3 refills | Status: DC

## 2023-03-21 MED ORDER — SPIRONOLACTONE 50 MG PO TABS
50.0000 mg | ORAL_TABLET | Freq: Every day | ORAL | 3 refills | Status: AC
Start: 2023-03-21 — End: ?

## 2023-03-21 NOTE — Assessment & Plan Note (Signed)
Stable.  No concerns. Blood work done today.

## 2023-03-21 NOTE — Assessment & Plan Note (Addendum)
Well compensated liver cirrhosis No longer drinking. No complications.  No signs of hepatic encephalopathy. Clinically euvolemic.  No significant ascites.  No signs of peritonitis No peripheral edema. Continue Aldactone 50 mg daily, furosemide 20 mg daily and carvedilol 3.125 mg twice a day

## 2023-03-21 NOTE — Assessment & Plan Note (Signed)
Clinically stable.  CBC done today.  No concerns.

## 2023-03-21 NOTE — Assessment & Plan Note (Signed)
Well-controlled with hemoglobin A1c at 5.7 Continues metformin 500 mg twice a day

## 2023-03-21 NOTE — Patient Instructions (Signed)
Cirrosis Cirrhosis  La cirrosis es una lesin de largo plazo (crnica) en el hgado. El hgado es el rgano interno ms grande del cuerpo, y cumple muchas funciones. Este rgano transforma los [employer], elimina las sustancias txicas de la [place], [place] protenas importantes y absorbe las vitaminas necesarias de los alimentos. En la cirrosis, las clulas hepticas son reemplazadas por tejido cicatricial. Esto impide que la sangre circule por el hgado y dificulta que el hgado realice sus funciones. Cules son las causas? Las causas ms comunes de la cirrosis son la hepatitis C y el consumo prolongado de alcohol. Otras causas son las siguientes: Enfermedad por hgado graso no alcohlico [facility]). Esto sucede cuando la grasa se deposita en el hgado por otras causas que no sean el alcohol. Infeccin por hepatitis B. Hepatitis autoinmune. En esta enfermedad, el sistema de defensa del cuerpo (sistema inmunitario) ataca por error las clulas del hgado, lo que causa inflamacin. Enfermedades que [employer] conductos dentro del hgado. Enfermedades hepticas hereditarias, como la hemocromatosis. Esta es una de las enfermedades hepticas hereditarias ms comunes. En esta enfermedad, depsitos de hierro se acumulan en el hgado y otros rganos. Reacciones a determinados medicamentos a Air cabin crew, como la [place], un medicamento para Insurance underwriter. Infecciones por parsitos. Estas incluyen la esquistosomiasis, que es una enfermedad causada por un platelminto. Contacto prolongado con ciertas sustancias txicas. Estas sustancias txicas incluyen ciertos solventes orgnicos, tales como cloroformo y tolueno. Qu incrementa el riesgo? Es ms probable que sufra esta afeccin si: Tiene ciertos tipos de hepatitis viral. Consume alcohol en exceso, especialmente si es mujer. Tiene sobrepeso. [place] drogas intravenosas y comparte agujas. Tiene relaciones sexuales sin usar proteccin con alguien que  tiene una hepatitis viral. Cules son los signos o sntomas? Es posible que no tenga signos ni sntomas al principio. Puede que los sntomas no se manifiesten hasta que el dao heptico empiece a Theme park manager. Entre los primeros sntomas, se pueden incluir los siguientes: Debilidad y Merchant navy officer (fatiga). Cambios en los patrones de sueo o dificultad para dormir. Picazn. Dolor a Merchandiser, retail parte superior derecha del abdomen. Adelgazamiento y prdida de masa muscular. Nuseas. Prdida del apetito. Entre los sntomas que aparecen en una etapa posterior se pueden incluir los siguientes: Fatiga o debilidad que [place]. Piel y ojos amarillos (ictericia). Acumulacin de lquido en el abdomen (ascitis). Puede notar que la ropa se le aprieta alrededor de la cintura. Aumento de peso e hinchazn de los pies y los tobillos (edema). Dificultad para respirar. Tener hematomas o hemorragias. Vomitar sangre o tener heces negras o con sangre. Confusin mental. Cmo se diagnostica? El mdico puede sospechar la presencia de cirrosis en funcin de los sntomas y la historia clnica, en especial si tiene otras enfermedades o antecedentes de consumo de alcohol. El [employer] har un examen fsico para palparle el hgado y buscar signos de cirrosis. Las pruebas pueden incluir las siguientes: Anlisis de sangre para Chief Operating Officer lo siguiente: Si tiene hepatitis B o C. La funcin renal. La funcin heptica. Pruebas de diagnstico por imgenes, por ejemplo: Resonancia magntica (RM) o exploracin por tomografa computarizada (TC) para buscar los cambios que se observan en los casos de cirrosis [place]. Ecografa para determinar si el tejido heptico normal est siendo reemplazado por tejido cicatricial. Un procedimiento en el que se [place] una aguja larga para tomar Lauris Poag de tejido heptico para estudiarlo en un laboratorio (biopsia). La biopsia de hgado puede confirmar el diagnstico de cirrosis. Cmo se  trata? El [place] de  esta afeccin depende de la magnitud del dao heptico y qu lo caus. Puede incluir el tratamiento de los sntomas de la cirrosis o de las causas subyacentes, a fin de Engineer, manufacturing el avance del [place]. El tratamiento puede incluir: Cambios de [place] de vida, como: Seguir una dieta saludable. Puede tener que consultar al mdico o a un nutricionista para Runner, broadcasting/film/video de alimentacin. Restringir la ingesta de sal. Mantener un peso saludable. No consumir drogas o alcohol. Tomar medicamentos para: Warehouse manager las infecciones del hgado u otras infecciones. Controlar la picazn. Disminuir la acumulacin de lquido. Reducir ciertas sustancias txicas de la sangre. Reducir el riesgo de hemorragia de los vasos sanguneos agrandados en el estmago o el esfago (vrices). Trasplante de hgado. En este procedimiento, el hgado de un donante se [place] para reemplazar el hgado enfermo. Esto se realiza si la cirrosis ha ocasionado insuficiencia heptica. Otros tratamientos y procedimientos, segn los problemas que usted tenga debido a la cirrosis. Los problemas frecuentes incluyen insuficiencia renal relacionada con el hgado (sndrome hepatorenal). Siga estas instrucciones en su casa:  Tome los medicamentos solamente como se lo haya indicado el mdico. No use medicamentos que sean txicos para el hgado. Consulte al mdico antes de tomar algn [employer], incluidos los de [address], [place] los antiinflamatorios no esteroideos [place]). Descanse todo lo que sea necesario. Siga una alimentacin bien equilibrada. Limite el consumo de sal o de agua, si el mdico le indica que lo haga. No beba alcohol. Esto es especialmente importante si toma paracetamol de manera habitual. Cumpla con todas las visitas de seguimiento. Esto es importante. Comunquese con un mdico si: Tiene fatiga o debilidad que empeora. Se le hinchan las manos, los pies o las piernas, o tiene acumulacin de lquido  en el abdomen (ascitis). Tiene fiebre o escalofros. Pierde el apetito. Siente nuseas o vmitos. Tiene ictericia. Se le forman hematomas o sangra con facilidad. Solicite ayuda de inmediato si: Vomita sangre de color rojo brillante o una sustancia parecida a los granos de caf. Tiene [employer]. Nota que las heces son de color negro y de aspecto alquitranado. Comienza a sentirse confundido. Tiene dolor en el pecho o dificultad para respirar. Estos sntomas pueden representar un problema grave que constituye Radio broadcast assistant. No espere a ver si los sntomas desaparecen. Solicite atencin mdica de inmediato. Comunquese con el servicio de emergencias de su localidad (911 en los [place]). No conduzca por sus propios medios [employer]. Resumen La cirrosis es una lesin crnica en el hgado. Las causas ms comunes son la hepatitis C y el consumo prolongado de alcohol. Las pruebas para diagnosticar la cirrosis incluyen anlisis de [place], estudios de diagnstico por imgenes y biopsia del hgado. El tratamiento de la afeccin incluye el tratamiento de la afeccin preexistente. Evite el consumo de alcohol, drogas, sal y los medicamentos que pueden daar el hgado. Busque ayuda de inmediato si vomita sangre de color rojo brillante o una sustancia parecida a los granos de caf. Esta informacin no tiene Theme park manager el consejo del mdico. Asegrese de hacerle al mdico cualquier pregunta que tenga. Document Revised: 06/23/2020 Document Reviewed: 06/23/2020 Elsevier Patient Education  2024 [employer].

## 2023-03-21 NOTE — Assessment & Plan Note (Signed)
No clinical bleeding episodes.  No petechia or ecchymosis CBC done today

## 2023-03-21 NOTE — Progress Notes (Signed)
Wesley Williams 52 y.o.   Chief Complaint  Patient presents with   Medical Management of Chronic Issues    F/u appt, no concerns     HISTORY OF PRESENT ILLNESS: This is a 52 y.o. male here for follow-up of chronic medical conditions and medication refills Overall doing well.  No more upper GI bleeding episodes Has no complaints or any other medical concerns today.  HPI   Prior to Admission medications   Medication Sig Start Date End Date Taking? Authorizing Provider  acetaminophen (TYLENOL) 500 MG tablet Take 500 mg by mouth every 6 (six) hours as needed (pain).   Yes [provider]  carvedilol (COREG) 3.125 MG tablet Take 1 tablet (3.125 mg total) by mouth 2 (two) times daily. 09/28/22 09/28/23 Yes Iva Boop, MD  diclofenac Sodium (VOLTAREN) 1 % GEL Apply 2 g topically 4 (four) times daily. 09/28/22  Yes Aberman, Caroline C, PA-C  ferrous sulfate 325 (65 FE) MG tablet Take 325 mg by mouth daily with breakfast.   Yes [provider]  furosemide (LASIX) 20 MG tablet Take 1 tablet (20 mg total) by mouth daily. 09/28/22  Yes Iva Boop, MD  metFORMIN (GLUCOPHAGE) 500 MG tablet Take 1 tablet (500 mg total) by mouth 2 (two) times daily with a meal. Follow-up appt due in July must see MD for refills 01/06/23 04/06/23 Yes Galilea Quito, Eilleen Kempf, MD  sildenafil (VIAGRA) 100 MG tablet Take 0.5-1 tablets (50-100 mg total) by mouth daily as needed for erectile dysfunction. 09/10/22  Yes Hardin Hardenbrook, Eilleen Kempf, MD  spironolactone (ALDACTONE) 50 MG tablet Take 1 tablet (50 mg total) by mouth daily. 09/28/22  Yes Iva Boop, MD    No Known Allergies  Patient Active Problem List   Diagnosis Date Noted   Thrombocytopenia (HCC) 09/08/2022   Symptomatic anemia 05/08/2022   Type 2 diabetes mellitus with hyperglycemia, without long-term current use of insulin (HCC) 12/28/2021   Hyperglycemia 06/29/2021   Secondary esophageal varices without bleeding (HCC)    Rectal varices     Portal hypertension (HCC)-colopathy    Alcoholic cirrhosis of liver without ascites (HCC) 03/18/2020   Pancytopenia (HCC)    Anemia 12/12/2017   Erectile dysfunction 12/12/2017   Alcohol abuse 04/23/2016    Past Medical History:  Diagnosis Date   Alcohol abuse    Cirrhosis (HCC)    Hypertension    Portal hypertension (HCC)-colopathy    Rhabdomyolysis 04/23/2016   Secondary esophageal varices without bleeding (HCC)    Type 2 diabetes mellitus (HCC)     Past Surgical History:  Procedure Laterality Date   COLONOSCOPY WITH PROPOFOL N/A 03/19/2020   Procedure: COLONOSCOPY WITH PROPOFOL;  Surgeon: Iva Boop, MD;  Location: [facility] ENDOSCOPY;  Service: Endoscopy;  Laterality: N/A;   ESOPHAGEAL BANDING  03/19/2020   Procedure: ESOPHAGEAL BANDING;  Surgeon: Iva Boop, MD;  Location: [facility] ENDOSCOPY;  Service: Endoscopy;;   ESOPHAGEAL BANDING N/A 04/07/2020   Procedure: ESOPHAGEAL BANDING;  Surgeon: Iva Boop, MD;  Location: [facility] ENDOSCOPY;  Service: Endoscopy;  Laterality: N/A;   ESOPHAGEAL BANDING N/A 05/19/2020   Procedure: ESOPHAGEAL BANDING;  Surgeon: Lynann Bologna, MD;  Location: [facility] ENDOSCOPY;  Service: Endoscopy;  Laterality: N/A;   ESOPHAGEAL BANDING  05/09/2022   Procedure: ESOPHAGEAL BANDING;  Surgeon: [name], MD;  Location: [facility] ENDOSCOPY;  Service: Gastroenterology;;   ESOPHAGEAL BANDING  06/14/2022   Procedure: ESOPHAGEAL BANDING;  Surgeon: Iva Boop, MD;  Location: [facility] ENDOSCOPY;  Service:  Gastroenterology;;   ESOPHAGEAL BANDING  08/10/2022   Procedure: ESOPHAGEAL BANDING;  Surgeon: Iva Boop, MD;  Location: [facility] ENDOSCOPY;  Service: Gastroenterology;;   ESOPHAGOGASTRODUODENOSCOPY (EGD) WITH PROPOFOL N/A 03/19/2020   Procedure: ESOPHAGOGASTRODUODENOSCOPY (EGD) WITH PROPOFOL;  Surgeon: Iva Boop, MD;  Location: [facility] ENDOSCOPY;  Service: Endoscopy;  Laterality: N/A;   ESOPHAGOGASTRODUODENOSCOPY (EGD) WITH PROPOFOL N/A 04/07/2020   Procedure:  ESOPHAGOGASTRODUODENOSCOPY (EGD) WITH PROPOFOL;  Surgeon: Iva Boop, MD;  Location: [facility] ENDOSCOPY;  Service: Endoscopy;  Laterality: N/A;   ESOPHAGOGASTRODUODENOSCOPY (EGD) WITH PROPOFOL N/A 05/19/2020   Procedure: ESOPHAGOGASTRODUODENOSCOPY (EGD) WITH PROPOFOL;  Surgeon: Lynann Bologna, MD;  Location: [facility] ENDOSCOPY;  Service: Endoscopy;  Laterality: N/A;   ESOPHAGOGASTRODUODENOSCOPY (EGD) WITH PROPOFOL N/A 05/09/2022   Procedure: ESOPHAGOGASTRODUODENOSCOPY (EGD) WITH PROPOFOL;  Surgeon: [name], MD;  Location: [facility] ENDOSCOPY;  Service: Gastroenterology;  Laterality: N/A;   ESOPHAGOGASTRODUODENOSCOPY (EGD) WITH PROPOFOL N/A 06/14/2022   Procedure: ESOPHAGOGASTRODUODENOSCOPY (EGD) WITH PROPOFOL;  Surgeon: Iva Boop, MD;  Location: [facility] ENDOSCOPY;  Service: Gastroenterology;  Laterality: N/A;   ESOPHAGOGASTRODUODENOSCOPY (EGD) WITH PROPOFOL N/A 08/10/2022   Procedure: ESOPHAGOGASTRODUODENOSCOPY (EGD) WITH PROPOFOL;  Surgeon: Iva Boop, MD;  Location: [facility] ENDOSCOPY;  Service: Gastroenterology;  Laterality: N/A;   ESOPHAGOGASTRODUODENOSCOPY (EGD) WITH PROPOFOL N/A 09/28/2022   Procedure: ESOPHAGOGASTRODUODENOSCOPY (EGD) WITH PROPOFOL;  Surgeon: Iva Boop, MD;  Location: [facility] ENDOSCOPY;  Service: Gastroenterology;  Laterality: N/A;    Social History   Socioeconomic History   Marital status: Married    Spouse name: Not on file   Number of children: Not on file   Years of education: Not on file   Highest education level: Not on file  Occupational History   Not on file  Tobacco Use   Smoking status: Never   Smokeless tobacco: Never  Vaping Use   Vaping status: Never Used  Substance and Sexual Activity   Alcohol use: Not Currently    Comment: former etoh abuse, none since july 2021.   Drug use: No   Sexual activity: Yes  Other Topics Concern   Not on file  Social History Narrative   Single lives w/ long-term girlfriend   Works for a tree service   Alcohol abuse hx    Never smoker, no drugs   Social Determinants of Health   Financial Resource Strain: Not on file  Food Insecurity: No Food Insecurity (05/12/2022)   Hunger Vital Sign    Worried About Running Out of Food in the Last Year: Never true    Ran Out of Food in the Last Year: Never true  Transportation Needs: No Transportation Needs (05/12/2022)   PRAPARE - Administrator, Civil Service (Medical): No    Lack of Transportation (Non-Medical): No  Physical Activity: Not on file  Stress: Not on file  Social Connections: Not on file  Intimate Partner Violence: Not At Risk (05/12/2022)   Humiliation, Afraid, Rape, and Kick questionnaire    Fear of Current or Ex-Partner: No    Emotionally Abused: No    Physically Abused: No    Sexually Abused: No    Family History  Problem Relation Age of Onset   Diabetes Mother      Review of Systems  Constitutional: Negative.  Negative for chills and fever.  HENT: Negative.  Negative for congestion and sore throat.   Respiratory: Negative.  Negative for cough and shortness of breath.   Cardiovascular: Negative.  Negative for chest pain and palpitations.  Gastrointestinal:  Negative  for abdominal pain, diarrhea, nausea and vomiting.  Genitourinary: Negative.  Negative for dysuria and hematuria.  Skin: Negative.  Negative for rash.  Neurological: Negative.  Negative for dizziness and headaches.  All other systems reviewed and are negative.   Vitals:   03/21/23 0956  BP: 138/74  Pulse: 65  Temp: 98 F (36.7 C)  SpO2: 93%   Wt Readings from Last 3 Encounters:  03/21/23 192 lb 6 oz (87.3 kg)  09/28/22 186 lb (84.4 kg)  09/08/22 185 lb 4 oz (84 kg)     Physical Exam Vitals reviewed.  Constitutional:      Appearance: Normal appearance.  HENT:     Head: Normocephalic.     Mouth/Throat:     Mouth: Mucous membranes are moist.     Pharynx: Oropharynx is clear.  Eyes:     Extraocular Movements: Extraocular movements intact.      Conjunctiva/sclera: Conjunctivae normal.     Pupils: Pupils are equal, round, and reactive to light.  Cardiovascular:     Rate and Rhythm: Normal rate and regular rhythm.     Pulses: Normal pulses.     Heart sounds: Normal heart sounds.  Pulmonary:     Effort: Pulmonary effort is normal.     Breath sounds: Normal breath sounds.  Abdominal:     General: There is no distension.     Palpations: Abdomen is soft.     Tenderness: There is no abdominal tenderness.     Comments: Mild ascites  Musculoskeletal:     Cervical back: No tenderness.     Right lower leg: No edema.     Left lower leg: No edema.  Lymphadenopathy:     Cervical: No cervical adenopathy.  Skin:    General: Skin is warm and dry.  Neurological:     General: No focal deficit present.     Mental Status: He is alert and oriented to person, place, and time.     Comments: No signs of hepatic encephalopathy Good mental status No asterixis on physical exam  Psychiatric:        Mood and Affect: Mood normal.        Behavior: Behavior normal.    Results for orders placed or performed in visit on 03/21/23 (from the past 24 hour(s))  POCT HgB A1C     Status: Abnormal   Collection Time: 03/21/23 11:49 AM  Result Value Ref Range   Hemoglobin A1C 5.7 (A) 4.0 - 5.6 %   HbA1c POC (<> result, manual entry)     HbA1c, POC (prediabetic range)     HbA1c, POC (controlled diabetic range)        ASSESSMENT & PLAN: Problem List Items Addressed This Visit       Cardiovascular and Mediastinum   Secondary esophageal varices without bleeding (HCC)    No clinical bleeding episodes in the past 6 months. Stable.  No concerns today. CBC done. Continue carvedilol 3.125 mg twice a day      Relevant Medications   carvedilol (COREG) 3.125 MG tablet   furosemide (LASIX) 20 MG tablet   spironolactone (ALDACTONE) 50 MG tablet   Portal hypertension (HCC)-colopathy    Stable.  No concerns. Blood work done today.      Relevant  Medications   carvedilol (COREG) 3.125 MG tablet   furosemide (LASIX) 20 MG tablet   spironolactone (ALDACTONE) 50 MG tablet     Digestive   Alcoholic cirrhosis of liver without ascites (HCC) - Primary  Well compensated liver cirrhosis No longer drinking. No complications.  No signs of hepatic encephalopathy. Clinically euvolemic.  No significant ascites.  No signs of peritonitis No peripheral edema. Continue Aldactone 50 mg daily, furosemide 20 mg daily and carvedilol 3.125 mg twice a day      Relevant Medications   carvedilol (COREG) 3.125 MG tablet   furosemide (LASIX) 20 MG tablet   spironolactone (ALDACTONE) 50 MG tablet   Other Relevant Orders   CBC with Differential/Platelet   Comprehensive metabolic panel   Lipid panel     Endocrine   Type 2 diabetes mellitus with hyperglycemia, without long-term current use of insulin (HCC)    Well-controlled with hemoglobin A1c at 5.7 Continues metformin 500 mg twice a day        Hematopoietic and Hemostatic   Pancytopenia (HCC)    Clinically stable.  CBC done today.  No concerns.      Thrombocytopenia (HCC)    No clinical bleeding episodes.  No petechia or ecchymosis CBC done today      Patient Instructions  Cirrosis Cirrhosis  La cirrosis es una lesin de largo plazo (crnica) en el hgado. El hgado es el rgano interno ms grande del cuerpo, y cumple muchas funciones. Este rgano transforma los [employer], elimina las sustancias txicas de la [place], [place] protenas importantes y absorbe las vitaminas necesarias de los alimentos. En la cirrosis, las clulas hepticas son reemplazadas por tejido cicatricial. Esto impide que la sangre circule por el hgado y dificulta que el hgado realice sus funciones. Cules son las causas? Las causas ms comunes de la cirrosis son la hepatitis C y el consumo prolongado de alcohol. Otras causas son las siguientes: Enfermedad por hgado graso no alcohlico [facility]). Esto  sucede cuando la grasa se deposita en el hgado por otras causas que no sean el alcohol. Infeccin por hepatitis B. Hepatitis autoinmune. En esta enfermedad, el sistema de defensa del cuerpo (sistema inmunitario) ataca por error las clulas del hgado, lo que causa inflamacin. Enfermedades que [employer] conductos dentro del hgado. Enfermedades hepticas hereditarias, como la hemocromatosis. Esta es una de las enfermedades hepticas hereditarias ms comunes. En esta enfermedad, depsitos de hierro se acumulan en el hgado y otros rganos. Reacciones a determinados medicamentos a Air cabin crew, como la [place], un medicamento para Insurance underwriter. Infecciones por parsitos. Estas incluyen la esquistosomiasis, que es una enfermedad causada por un platelminto. Contacto prolongado con ciertas sustancias txicas. Estas sustancias txicas incluyen ciertos solventes orgnicos, tales como cloroformo y tolueno. Qu incrementa el riesgo? Es ms probable que sufra esta afeccin si: Tiene ciertos tipos de hepatitis viral. Consume alcohol en exceso, especialmente si es mujer. Tiene sobrepeso. [place] drogas intravenosas y comparte agujas. Tiene relaciones sexuales sin usar proteccin con alguien que tiene una hepatitis viral. Cules son los signos o sntomas? Es posible que no tenga signos ni sntomas al principio. Puede que los sntomas no se manifiesten hasta que el dao heptico empiece a Theme park manager. Entre los primeros sntomas, se pueden incluir los siguientes: Debilidad y Merchant navy officer (fatiga). Cambios en los patrones de sueo o dificultad para dormir. Picazn. Dolor a Merchandiser, retail parte superior derecha del abdomen. Adelgazamiento y prdida de masa muscular. Nuseas. Prdida del apetito. Entre los sntomas que aparecen en una etapa posterior se pueden incluir los siguientes: Fatiga o debilidad que [place]. Piel y ojos amarillos (ictericia). Acumulacin de lquido en el abdomen (ascitis). Puede  notar que la ropa se le aprieta alrededor de la cintura. Aumento  de peso e hinchazn de los pies y los tobillos (edema). Dificultad para respirar. Tener hematomas o hemorragias. Vomitar sangre o tener heces negras o con sangre. Confusin mental. Cmo se diagnostica? El mdico puede sospechar la presencia de cirrosis en funcin de los sntomas y la historia clnica, en especial si tiene otras enfermedades o antecedentes de consumo de alcohol. El [employer] har un examen fsico para palparle el hgado y buscar signos de cirrosis. Las pruebas pueden incluir las siguientes: Anlisis de sangre para Chief Operating Officer lo siguiente: Si tiene hepatitis B o C. La funcin renal. La funcin heptica. Pruebas de diagnstico por imgenes, por ejemplo: Resonancia magntica (RM) o exploracin por tomografa computarizada (TC) para buscar los cambios que se observan en los casos de cirrosis [place]. Ecografa para determinar si el tejido heptico normal est siendo reemplazado por tejido cicatricial. Un procedimiento en el que se [place] una aguja larga para tomar Lauris Poag de tejido heptico para estudiarlo en un laboratorio (biopsia). La biopsia de hgado puede confirmar el diagnstico de cirrosis. Cmo se trata? El tratamiento de esta afeccin depende de la magnitud del dao heptico y qu lo caus. Puede incluir el tratamiento de los sntomas de la cirrosis o de las causas subyacentes, a fin de Engineer, manufacturing el avance del [place]. El tratamiento puede incluir: Cambios de [place] de vida, como: Seguir una dieta saludable. Puede tener que consultar al mdico o a un nutricionista para Runner, broadcasting/film/video de alimentacin. Restringir la ingesta de sal. Mantener un peso saludable. No consumir drogas o alcohol. Tomar medicamentos para: Warehouse manager las infecciones del hgado u otras infecciones. Controlar la picazn. Disminuir la acumulacin de lquido. Reducir ciertas sustancias txicas de la sangre. Reducir el riesgo de hemorragia  de los vasos sanguneos agrandados en el estmago o el esfago (vrices). Trasplante de hgado. En este procedimiento, el hgado de un donante se [place] para reemplazar el hgado enfermo. Esto se realiza si la cirrosis ha ocasionado insuficiencia heptica. Otros tratamientos y procedimientos, segn los problemas que usted tenga debido a la cirrosis. Los problemas frecuentes incluyen insuficiencia renal relacionada con el hgado (sndrome hepatorenal). Siga estas instrucciones en su casa:  Tome los medicamentos solamente como se lo haya indicado el mdico. No use medicamentos que sean txicos para el hgado. Consulte al mdico antes de tomar algn [employer], incluidos los de [address], [place] los antiinflamatorios no esteroideos [place]). Descanse todo lo que sea necesario. Siga una alimentacin bien equilibrada. Limite el consumo de sal o de agua, si el mdico le indica que lo haga. No beba alcohol. Esto es especialmente importante si toma paracetamol de manera habitual. Cumpla con todas las visitas de seguimiento. Esto es importante. Comunquese con un mdico si: Tiene fatiga o debilidad que empeora. Se le hinchan las manos, los pies o las piernas, o tiene acumulacin de lquido en el abdomen (ascitis). Tiene fiebre o escalofros. Pierde el apetito. Siente nuseas o vmitos. Tiene ictericia. Se le forman hematomas o sangra con facilidad. Solicite ayuda de inmediato si: Vomita sangre de color rojo brillante o una sustancia parecida a los granos de caf. Tiene [employer]. Nota que las heces son de color negro y de aspecto alquitranado. Comienza a sentirse confundido. Tiene dolor en el pecho o dificultad para respirar. Estos sntomas pueden representar un problema grave que constituye Radio broadcast assistant. No espere a ver si los sntomas desaparecen. Solicite atencin mdica de inmediato. Comunquese con el servicio de emergencias de su localidad (911 en los [place]). No  conduzca por sus propios medios [employer]. Resumen La cirrosis es una lesin crnica en el hgado. Las causas ms comunes son la hepatitis C y el consumo prolongado de alcohol. Las pruebas para diagnosticar la cirrosis incluyen anlisis de [place], estudios de diagnstico por imgenes y biopsia del hgado. El tratamiento de la afeccin incluye el tratamiento de la afeccin preexistente. Evite el consumo de alcohol, drogas, sal y los medicamentos que pueden daar el hgado. Busque ayuda de inmediato si vomita sangre de color rojo brillante o una sustancia parecida a los granos de caf. Esta informacin no tiene Theme park manager el consejo del mdico. Asegrese de hacerle al mdico cualquier pregunta que tenga. Document Revised: 06/23/2020 Document Reviewed: 06/23/2020 Elsevier Patient Education  2024 Elsevier Inc.      Edwina Barth, MD [facility]

## 2023-03-21 NOTE — Assessment & Plan Note (Addendum)
No clinical bleeding episodes in the past 6 months. Stable.  No concerns today. CBC done. Continue carvedilol 3.125 mg twice a day

## 2023-04-29 ENCOUNTER — Other Ambulatory Visit: Payer: Self-pay | Admitting: *Deleted

## 2023-04-29 DIAGNOSIS — E1165 Type 2 diabetes mellitus with hyperglycemia: Secondary | ICD-10-CM

## 2023-04-29 MED ORDER — METFORMIN HCL 500 MG PO TABS
500.0000 mg | ORAL_TABLET | Freq: Two times a day (BID) | ORAL | 0 refills | Status: DC
Start: 2023-04-29 — End: 2023-08-10

## 2023-08-10 ENCOUNTER — Other Ambulatory Visit: Payer: Self-pay | Admitting: Radiology

## 2023-08-10 ENCOUNTER — Telehealth: Payer: Self-pay | Admitting: Emergency Medicine

## 2023-08-10 DIAGNOSIS — E1165 Type 2 diabetes mellitus with hyperglycemia: Secondary | ICD-10-CM

## 2023-08-10 MED ORDER — METFORMIN HCL 500 MG PO TABS
500.0000 mg | ORAL_TABLET | Freq: Two times a day (BID) | ORAL | 0 refills | Status: DC
Start: 2023-08-10 — End: 2023-11-07

## 2023-08-10 NOTE — Telephone Encounter (Signed)
 Okay to refill please

## 2023-08-10 NOTE — Telephone Encounter (Signed)
Caller & Relationship to patient: Self  Call back number: [phone]   Date of last office visit: 7.15.24  Date of next office visit: 1.13.25  Medication(s) to be refilled:  metFORMIN (GLUCOPHAGE) 500 MG tablet   Preferred Pharmacy:  [id number]  Phone: [phone]  Fax: [phone]

## 2023-09-19 ENCOUNTER — Ambulatory Visit: Payer: Self-pay | Admitting: Emergency Medicine

## 2023-09-29 ENCOUNTER — Ambulatory Visit (INDEPENDENT_AMBULATORY_CARE_PROVIDER_SITE_OTHER): Payer: Self-pay | Admitting: Emergency Medicine

## 2023-09-29 ENCOUNTER — Encounter: Payer: Self-pay | Admitting: Emergency Medicine

## 2023-09-29 VITALS — BP 118/82 | HR 83 | Temp 98.4°F | Ht 65.0 in | Wt 178.0 lb

## 2023-09-29 DIAGNOSIS — N481 Balanitis: Secondary | ICD-10-CM | POA: Insufficient documentation

## 2023-09-29 DIAGNOSIS — K7030 Alcoholic cirrhosis of liver without ascites: Secondary | ICD-10-CM

## 2023-09-29 DIAGNOSIS — E1165 Type 2 diabetes mellitus with hyperglycemia: Secondary | ICD-10-CM

## 2023-09-29 DIAGNOSIS — Z7984 Long term (current) use of oral hypoglycemic drugs: Secondary | ICD-10-CM

## 2023-09-29 DIAGNOSIS — Z23 Encounter for immunization: Secondary | ICD-10-CM

## 2023-09-29 LAB — CBC WITH DIFFERENTIAL/PLATELET
Basophils Absolute: 0 10*3/uL (ref 0.0–0.1)
Basophils Relative: 0.9 % (ref 0.0–3.0)
Eosinophils Absolute: 0.1 10*3/uL (ref 0.0–0.7)
Eosinophils Relative: 2.7 % (ref 0.0–5.0)
HCT: 42.1 % (ref 39.0–52.0)
Hemoglobin: 14.5 g/dL (ref 13.0–17.0)
Lymphocytes Relative: 12.5 % (ref 12.0–46.0)
Lymphs Abs: 0.4 10*3/uL — ABNORMAL LOW (ref 0.7–4.0)
MCHC: 34.4 g/dL (ref 30.0–36.0)
MCV: 93 fL (ref 78.0–100.0)
Monocytes Absolute: 0.4 10*3/uL (ref 0.1–1.0)
Monocytes Relative: 11.7 % (ref 3.0–12.0)
Neutro Abs: 2.5 10*3/uL (ref 1.4–7.7)
Neutrophils Relative %: 72.2 % (ref 43.0–77.0)
Platelets: 55 10*3/uL — ABNORMAL LOW (ref 150.0–400.0)
RBC: 4.53 Mil/uL (ref 4.22–5.81)
RDW: 15 % (ref 11.5–15.5)
WBC: 3.5 10*3/uL — ABNORMAL LOW (ref 4.0–10.5)

## 2023-09-29 LAB — COMPREHENSIVE METABOLIC PANEL
ALT: 31 U/L (ref 0–53)
AST: 34 U/L (ref 0–37)
Albumin: 3.7 g/dL (ref 3.5–5.2)
Alkaline Phosphatase: 88 U/L (ref 39–117)
BUN: 20 mg/dL (ref 6–23)
CO2: 23 meq/L (ref 19–32)
Calcium: 9.2 mg/dL (ref 8.4–10.5)
Chloride: 100 meq/L (ref 96–112)
Creatinine, Ser: 0.69 mg/dL (ref 0.40–1.50)
GFR: 105.46 mL/min (ref >60.00)
Glucose, Bld: 413 mg/dL — ABNORMAL HIGH (ref 70–99)
Potassium: 4.2 meq/L (ref 3.5–5.1)
Sodium: 132 meq/L — ABNORMAL LOW (ref 135–145)
Total Bilirubin: 1.8 mg/dL — ABNORMAL HIGH (ref 0.2–1.2)
Total Protein: 7.7 g/dL (ref 6.0–8.3)

## 2023-09-29 LAB — POCT GLYCOSYLATED HEMOGLOBIN (HGB A1C): Hemoglobin A1C: 9.6 % — AB (ref 4.0–5.6)

## 2023-09-29 LAB — LIPID PANEL
Cholesterol: 178 mg/dL (ref 0–200)
HDL: 36.7 mg/dL — ABNORMAL LOW (ref >39.00)
LDL Cholesterol: 95 mg/dL (ref 0–99)
NonHDL: 141.06
Total CHOL/HDL Ratio: 5
Triglycerides: 231 mg/dL — ABNORMAL HIGH (ref 0.0–149.0)
VLDL: 46.2 mg/dL — ABNORMAL HIGH (ref 0.0–40.0)

## 2023-09-29 MED ORDER — CLOTRIMAZOLE-BETAMETHASONE 1-0.05 % EX CREA
1.0000 | TOPICAL_CREAM | Freq: Two times a day (BID) | CUTANEOUS | 2 refills | Status: AC
Start: 2023-09-29 — End: 2023-10-06

## 2023-09-29 MED ORDER — GLIPIZIDE 5 MG PO TABS
5.0000 mg | ORAL_TABLET | Freq: Two times a day (BID) | ORAL | 3 refills | Status: DC
Start: 2023-09-29 — End: 2023-12-28

## 2023-09-29 NOTE — Patient Instructions (Signed)
Diabetes mellitus y nutricin, en adultos Diabetes Mellitus and Nutrition, Adult Si sufre de diabetes, o diabetes mellitus, es muy importante tener hbitos alimenticios saludables debido a que sus niveles de azcar en la sangre (glucosa) se ven afectados en gran medida por lo que come y bebe. Comer alimentos saludables en las cantidades correctas, aproximadamente a la misma hora todos los das, lo ayudar a: Controlar su glucemia. Disminuir el riesgo de sufrir una enfermedad cardaca. Mejorar la presin arterial. Alcanzar o mantener un peso saludable. Qu puede afectar mi plan de alimentacin? Todas las personas que sufren de diabetes son diferentes y cada una tiene necesidades diferentes en cuanto a un plan de alimentacin. El mdico puede recomendarle que trabaje con un nutricionista para elaborar el mejor plan para usted. Su plan de alimentacin puede variar segn factores como: Las caloras que necesita. Los medicamentos que toma. Su peso. Sus niveles de glucemia, presin arterial y colesterol. Su nivel de actividad. Otras afecciones que tenga, como enfermedades cardacas o renales. Cmo me afectan los carbohidratos? Los carbohidratos, o hidratos de carbono, afectan su nivel de glucemia ms que cualquier otro tipo de alimento. La ingesta de carbohidratos aumenta la cantidad de glucosa en la sangre. Es importante conocer la cantidad de carbohidratos que se pueden ingerir en cada comida sin correr ningn riesgo. Esto es diferente en cada persona. Su nutricionista puede ayudarlo a calcular la cantidad de carbohidratos que debe ingerir en cada comida y en cada refrigerio. Cmo me afecta el alcohol? El alcohol puede provocar una disminucin de la glucemia (hipoglucemia), especialmente si usa insulina o toma determinados medicamentos por va oral para la diabetes. La hipoglucemia es una afeccin potencialmente mortal. Los sntomas de la hipoglucemia, como somnolencia, mareos y confusin, son  similares a los sntomas de haber consumido demasiado alcohol. No beba alcohol si: Su mdico le indica no hacerlo. Est embarazada, puede estar embarazada o est tratando de quedar embarazada. Si bebe alcohol: Limite la cantidad que bebe a lo siguiente: De 0 a 1 medida por da para las mujeres. De 0 a 2 medidas por da para los hombres. Sepa cunta cantidad de alcohol hay en las bebidas que toma. En [place], una medida equivale a una botella de cerveza de 12 oz (355 ml), un vaso de vino de 5 oz (148 ml) o un vaso de una bebida alcohlica de alta graduacin de 1 oz (44 ml). Mantngase hidratado bebiendo agua, refrescos dietticos o t helado sin azcar. Tenga en cuenta que los refrescos comunes, los jugos y otras bebidas para mezclar pueden contener mucha azcar y se deben contar como carbohidratos. Consejos para seguir este plan  Leer las etiquetas de los alimentos Comience por leer el tamao de la porcin en la etiqueta de Informacin nutricional de los alimentos envasados y las bebidas. La cantidad de caloras, carbohidratos, grasas y otros nutrientes detallados en la etiqueta se basan en una porcin del alimento. Muchos alimentos contienen ms de una porcin por envase. Verifique la cantidad total de gramos (g) de carbohidratos totales en una porcin. Verifique la cantidad de gramos de grasas saturadas y grasas trans en una porcin. Escoja alimentos que no contengan estas grasas o que su contenido de estas sea bajo. Verifique la cantidad de miligramos (mg) de sal (sodio) en una porcin. La mayora de las personas deben limitar la ingesta de sodio total a menos de 2300 mg por da. Siempre consulte la informacin nutricional de los alimentos etiquetados como "con bajo contenido de grasa" o "sin grasa".   Estos alimentos pueden tener un mayor contenido de azcar agregada o carbohidratos refinados, y deben evitarse. Hable con su nutricionista para identificar sus objetivos diarios en  cuanto a los nutrientes mencionados en la etiqueta. Al ir de compras Evite comprar alimentos procesados, enlatados o precocidos. Estos alimentos tienden a tener una mayor cantidad de grasa, sodio y azcar agregada. Compre en la zona exterior de la tienda de comestibles. Esta es la zona donde se encuentran con mayor frecuencia las frutas y las verduras frescas, los cereales a granel, las carnes frescas y los productos lcteos frescos. Al cocinar Use mtodos de coccin a baja temperatura, como hornear, en lugar de mtodos de coccin a alta temperatura, como frer en abundante aceite. Cocine con aceites saludables, como el aceite de oliva, canola o girasol. Evite cocinar con manteca, crema o carnes con alto contenido de grasa. Planificacin de las comidas Coma las comidas y los refrigerios regularmente, preferentemente a la misma hora todos los das. Evite pasar largos perodos de tiempo sin comer. Consuma alimentos ricos en fibra, como frutas frescas, verduras, frijoles y cereales integrales. Consuma entre 4 y 6 onzas (entre 112 y 168 g) de protenas magras por da, como carnes magras, pollo, pescado, huevos o tofu. Una onza (oz) (28 g) de protena magra equivale a: 1 onza (28 g) de carne, pollo o pescado. 1 huevo.  taza (62 g) de tofu. Coma algunos alimentos por da que contengan grasas saludables, como aguacates, frutos secos, semillas y pescado. Qu alimentos debo comer? Frutas Bayas. Manzanas. Naranjas. Duraznos. Damascos. Ciruelas. Uvas. Mangos. Papayas. Granadas. Kiwi. Cerezas. Verduras Verduras de hoja verde, que incluyen lechuga, espinaca, col rizada, acelga, hojas de berza, hojas de mostaza y repollo. Remolachas. Coliflor. Brcoli. Zanahorias. Judas verdes. Tomates. Pimientos. Cebollas. Pepinos. Coles de Bruselas. Granos Granos integrales, como panes, galletas, tortillas, cereales y pastas de salvado o integrales. Avena sin azcar. Quinua. Arroz integral o salvaje. Carnes y otras  protenas Frutos de mar. Carne de ave sin piel. Cortes magros de ave y carne de res. Tofu. Frutos secos. Semillas. Lcteos Productos lcteos sin grasa o con bajo contenido de grasa, como leche, yogur y queso. Es posible que los productos detallados arriba no constituyan una lista completa de los alimentos y las bebidas que puede tomar. Consulte a un nutricionista para obtener ms informacin. Qu alimentos debo evitar? Frutas Frutas enlatadas al almbar. Verduras Verduras enlatadas. Verduras congeladas con mantequilla o salsa de crema. Granos Productos elaborados con harina y harina blanca refinada, como panes, pastas, bocadillos y cereales. Evite todos los alimentos procesados. Carnes y otras protenas Cortes de carne con alto contenido de grasa. Carne de ave con piel. Carnes empanizadas o fritas. Carne procesada. Evite las grasas saturadas. Lcteos Yogur, queso o leche enteros. Bebidas Bebidas azucaradas, como gaseosas o t helado. Es posible que los productos que se enumeran ms arriba no constituyan una lista completa de los alimentos y las bebidas que debe evitar. Consulte a un nutricionista para obtener ms informacin. Preguntas para hacerle al mdico Debo consultar con un especialista certificado en atencin y educacin sobre la diabetes? Es necesario que me rena con un nutricionista? A qu nmero puedo llamar si tengo preguntas? Cules son los mejores momentos para controlar la glucemia? Dnde encontrar ms informacin: American Diabetes Association (Asociacin Estadounidense de la Diabetes): diabetes.org Academy of Nutrition and Dietetics (Academia de Nutricin y Diettica): eatright.org National Institute of Diabetes and Digestive and Kidney Diseases (Instituto Nacional de la Diabetes y las Enfermedades Digestivas y Renales): niddk.nih.gov Association of Diabetes   Care & Education Specialists (Asociacin de Especialistas en Atencin y Educacin sobre la Diabetes):  diabeteseducator.org Resumen Es importante tener hbitos alimenticios saludables debido a que sus niveles de azcar en la sangre (glucosa) se ven afectados en gran medida por lo que come y bebe. Es importante consumir alcohol con prudencia. Un plan de comidas saludable lo ayudar a controlar la glucosa en sangre y a reducir el riesgo de enfermedades cardacas. El mdico puede recomendarle que trabaje con un nutricionista para elaborar el mejor plan para usted. Esta informacin no tiene como fin reemplazar el consejo del mdico. Asegrese de hacerle al mdico cualquier pregunta que tenga. Document Revised: 04/30/2020 Document Reviewed: 04/30/2020 Elsevier Patient Education  2024 [employer]  

## 2023-09-29 NOTE — Progress Notes (Signed)
Wesley Williams 53 y.o.   Chief Complaint  Patient presents with   Follow-up    6 month f/u. Patient wanting his A1c checked he was concerns, states he had 2 pepsi and a cake and his sugar level was 400 something. Patient wants to talk to provider states it is personal     HISTORY OF PRESENT ILLNESS: This is a 53 y.o. male here for 69-month follow-up of multiple chronic medical conditions Patient has history of alcoholic liver cirrhosis and diabetes Complaining of inflammation to tip of his penis No other complaints or medical concerns today Lab Results  Component Value Date   HGBA1C 5.7 (A) 03/21/2023   Wt Readings from Last 3 Encounters:  03/21/23 192 lb 6 oz (87.3 kg)  09/28/22 186 lb (84.4 kg)  09/08/22 185 lb 4 oz (84 kg)     HPI   Prior to Admission medications   Medication Sig Start Date End Date Taking? Authorizing Provider  acetaminophen (TYLENOL) 500 MG tablet Take 500 mg by mouth every 6 (six) hours as needed (pain).   Yes [provider]  carvedilol (COREG) 3.125 MG tablet Take 1 tablet (3.125 mg total) by mouth 2 (two) times daily. 03/21/23 03/20/24 Yes Charmian Forbis, Eilleen Kempf, MD  diclofenac Sodium (VOLTAREN) 1 % GEL Apply 2 g topically 4 (four) times daily. 09/28/22  Yes Aberman, Caroline C, PA-C  ferrous sulfate 325 (65 FE) MG tablet Take 325 mg by mouth daily with breakfast.   Yes [provider]  furosemide (LASIX) 20 MG tablet Take 1 tablet (20 mg total) by mouth daily. 03/21/23  Yes Georgina Quint, MD  metFORMIN (GLUCOPHAGE) 500 MG tablet Take 1 tablet (500 mg total) by mouth 2 (two) times daily with a meal. 08/10/23 11/08/23 Yes Harlan Vinal, Eilleen Kempf, MD  sildenafil (VIAGRA) 100 MG tablet Take 0.5-1 tablets (50-100 mg total) by mouth daily as needed for erectile dysfunction. 09/10/22  Yes Atiba Kimberlin, Eilleen Kempf, MD  spironolactone (ALDACTONE) 50 MG tablet Take 1 tablet (50 mg total) by mouth daily. 03/21/23  Yes Georgina Quint, MD    No  Known Allergies  Patient Active Problem List   Diagnosis Date Noted   Thrombocytopenia (HCC) 09/08/2022   Type 2 diabetes mellitus with hyperglycemia, without long-term current use of insulin (HCC) 12/28/2021   Secondary esophageal varices without bleeding (HCC)    Rectal varices    Portal hypertension (HCC)-colopathy    Alcoholic cirrhosis of liver without ascites (HCC) 03/18/2020   Pancytopenia (HCC)    Anemia 12/12/2017   Erectile dysfunction 12/12/2017    Past Medical History:  Diagnosis Date   Alcohol abuse    Cirrhosis (HCC)    Hypertension    Portal hypertension (HCC)-colopathy    Rhabdomyolysis 04/23/2016   Secondary esophageal varices without bleeding (HCC)    Type 2 diabetes mellitus (HCC)     Past Surgical History:  Procedure Laterality Date   COLONOSCOPY WITH PROPOFOL N/A 03/19/2020   Procedure: COLONOSCOPY WITH PROPOFOL;  Surgeon: Iva Boop, MD;  Location: [facility] ENDOSCOPY;  Service: Endoscopy;  Laterality: N/A;   ESOPHAGEAL BANDING  03/19/2020   Procedure: ESOPHAGEAL BANDING;  Surgeon: Iva Boop, MD;  Location: [facility] ENDOSCOPY;  Service: Endoscopy;;   ESOPHAGEAL BANDING N/A 04/07/2020   Procedure: ESOPHAGEAL BANDING;  Surgeon: Iva Boop, MD;  Location: [facility] ENDOSCOPY;  Service: Endoscopy;  Laterality: N/A;   ESOPHAGEAL BANDING N/A 05/19/2020   Procedure: ESOPHAGEAL BANDING;  Surgeon: Lynann Bologna, MD;  Location: [facility] ENDOSCOPY;  Service: Endoscopy;  Laterality: N/A;   ESOPHAGEAL BANDING  05/09/2022   Procedure: ESOPHAGEAL BANDING;  Surgeon: [name], MD;  Location: [facility] ENDOSCOPY;  Service: Gastroenterology;;   ESOPHAGEAL BANDING  06/14/2022   Procedure: ESOPHAGEAL BANDING;  Surgeon: Iva Boop, MD;  Location: [facility] ENDOSCOPY;  Service: Gastroenterology;;   ESOPHAGEAL BANDING  08/10/2022   Procedure: ESOPHAGEAL BANDING;  Surgeon: Iva Boop, MD;  Location: [facility] ENDOSCOPY;  Service: Gastroenterology;;   ESOPHAGOGASTRODUODENOSCOPY (EGD) WITH PROPOFOL  N/A 03/19/2020   Procedure: ESOPHAGOGASTRODUODENOSCOPY (EGD) WITH PROPOFOL;  Surgeon: Iva Boop, MD;  Location: [facility] ENDOSCOPY;  Service: Endoscopy;  Laterality: N/A;   ESOPHAGOGASTRODUODENOSCOPY (EGD) WITH PROPOFOL N/A 04/07/2020   Procedure: ESOPHAGOGASTRODUODENOSCOPY (EGD) WITH PROPOFOL;  Surgeon: Iva Boop, MD;  Location: [facility] ENDOSCOPY;  Service: Endoscopy;  Laterality: N/A;   ESOPHAGOGASTRODUODENOSCOPY (EGD) WITH PROPOFOL N/A 05/19/2020   Procedure: ESOPHAGOGASTRODUODENOSCOPY (EGD) WITH PROPOFOL;  Surgeon: Lynann Bologna, MD;  Location: [facility] ENDOSCOPY;  Service: Endoscopy;  Laterality: N/A;   ESOPHAGOGASTRODUODENOSCOPY (EGD) WITH PROPOFOL N/A 05/09/2022   Procedure: ESOPHAGOGASTRODUODENOSCOPY (EGD) WITH PROPOFOL;  Surgeon: [name], MD;  Location: [facility] ENDOSCOPY;  Service: Gastroenterology;  Laterality: N/A;   ESOPHAGOGASTRODUODENOSCOPY (EGD) WITH PROPOFOL N/A 06/14/2022   Procedure: ESOPHAGOGASTRODUODENOSCOPY (EGD) WITH PROPOFOL;  Surgeon: Iva Boop, MD;  Location: [facility] ENDOSCOPY;  Service: Gastroenterology;  Laterality: N/A;   ESOPHAGOGASTRODUODENOSCOPY (EGD) WITH PROPOFOL N/A 08/10/2022   Procedure: ESOPHAGOGASTRODUODENOSCOPY (EGD) WITH PROPOFOL;  Surgeon: Iva Boop, MD;  Location: [facility] ENDOSCOPY;  Service: Gastroenterology;  Laterality: N/A;   ESOPHAGOGASTRODUODENOSCOPY (EGD) WITH PROPOFOL N/A 09/28/2022   Procedure: ESOPHAGOGASTRODUODENOSCOPY (EGD) WITH PROPOFOL;  Surgeon: Iva Boop, MD;  Location: [facility] ENDOSCOPY;  Service: Gastroenterology;  Laterality: N/A;    Social History   Socioeconomic History   Marital status: Married    Spouse name: Not on file   Number of children: Not on file   Years of education: Not on file   Highest education level: Not on file  Occupational History   Not on file  Tobacco Use   Smoking status: Never   Smokeless tobacco: Never  Vaping Use   Vaping status: Never Used  Substance and Sexual Activity   Alcohol use: Not Currently     Comment: former etoh abuse, none since july 2021.   Drug use: No   Sexual activity: Yes  Other Topics Concern   Not on file  Social History Narrative   Single lives w/ long-term girlfriend   Works for a tree service   Alcohol abuse hx   Never smoker, no drugs   Social Drivers of Corporate investment banker Strain: Not on file  Food Insecurity: No Food Insecurity (05/12/2022)   Hunger Vital Sign    Worried About Running Out of Food in the Last Year: Never true    Ran Out of Food in the Last Year: Never true  Transportation Needs: No Transportation Needs (05/12/2022)   PRAPARE - Administrator, Civil Service (Medical): No    Lack of Transportation (Non-Medical): No  Physical Activity: Not on file  Stress: Not on file  Social Connections: Not on file  Intimate Partner Violence: Not At Risk (05/12/2022)   Humiliation, Afraid, Rape, and Kick questionnaire    Fear of Current or Ex-Partner: No    Emotionally Abused: No    Physically Abused: No    Sexually Abused: No    Family History  Problem Relation Age of Onset   Diabetes Mother  Review of Systems  Constitutional: Negative.  Negative for chills and fever.  HENT:  Negative for congestion and sore throat.   Respiratory: Negative.  Negative for cough and shortness of breath.   Cardiovascular: Negative.  Negative for chest pain and palpitations.  Gastrointestinal:  Negative for abdominal pain, nausea and vomiting.  Genitourinary: Negative.  Negative for dysuria and frequency.  Skin:  Positive for rash (Foreskin of his penis).  Neurological: Negative.  Negative for dizziness and headaches.  All other systems reviewed and are negative.   There were no vitals filed for this visit.  Physical Exam Vitals reviewed. Chaperone present: Foreskin is erythematous and inflamed.  Constitutional:      Appearance: Normal appearance.  HENT:     Head: Normocephalic.     Mouth/Throat:     Mouth: Mucous membranes are moist.      Pharynx: Oropharynx is clear.  Eyes:     Extraocular Movements: Extraocular movements intact.     Conjunctiva/sclera: Conjunctivae normal.     Pupils: Pupils are equal, round, and reactive to light.  Cardiovascular:     Rate and Rhythm: Normal rate and regular rhythm.     Pulses: Normal pulses.     Heart sounds: Normal heart sounds.  Pulmonary:     Effort: Pulmonary effort is normal.     Breath sounds: Normal breath sounds.  Genitourinary:    Penis: Uncircumcised.      Testes: Normal.  Musculoskeletal:     Cervical back: No tenderness.  Lymphadenopathy:     Cervical: No cervical adenopathy.  Skin:    General: Skin is warm and dry.     Capillary Refill: Capillary refill takes less than 2 seconds.  Neurological:     General: No focal deficit present.     Mental Status: He is alert and oriented to person, place, and time.  Psychiatric:        Mood and Affect: Mood normal.        Behavior: Behavior normal.    Results for orders placed or performed in visit on 09/29/23 (from the past 24 hours)  POCT glycosylated hemoglobin (Hb A1C)     Status: Abnormal   Collection Time: 09/29/23  1:42 PM  Result Value Ref Range   Hemoglobin A1C 9.6 (A) 4.0 - 5.6 %   HbA1c POC (<> result, manual entry)     HbA1c, POC (prediabetic range)     HbA1c, POC (controlled diabetic range)       ASSESSMENT & PLAN: Problem List Items Addressed This Visit       Digestive   Alcoholic cirrhosis of liver without ascites (HCC) - Primary   Well compensated liver cirrhosis No longer drinking. No complications.  No signs of hepatic encephalopathy. Clinically euvolemic.  No significant ascites.  No signs of peritonitis No peripheral edema. Continue Aldactone 50 mg daily, furosemide 20 mg daily and carvedilol 3.125 mg twice a day        Endocrine   Type 2 diabetes mellitus with hyperglycemia, without long-term current use of insulin (HCC)   Lab Results  Component Value Date   HGBA1C 9.6 (A)  09/29/2023  Uncontrolled diabetes with hemoglobin A1c of 9.6 Cardiovascular risks associated with uncontrolled diabetes discussed Diet and nutrition discussed Continue metformin 500 mg twice a day and start glipizide 5 mg twice a day Hypoglycemia precautions given Blood work done today Follow-up in 3 months        Relevant Medications   glipiZIDE (GLUCOTROL) 5 MG tablet  Other Relevant Orders   POCT glycosylated hemoglobin (Hb A1C) (Completed)   CBC with Differential/Platelet   Comprehensive metabolic panel   Lipid panel     Genitourinary   Balanitis   Secondary to uncontrolled diabetes Recommend to start Lotrisone cream twice a day for the next 7 days Need for tight diabetes control discussed with patient       Relevant Medications   clotrimazole-betamethasone (LOTRISONE) cream   Other Visit Diagnoses       Need for vaccination       Relevant Orders   Flu vaccine trivalent PF, 6mos and older(Flulaval,Afluria,Fluarix,Fluzone) (Completed)      Patient Instructions  Diabetes mellitus y nutricin, en adultos Diabetes Mellitus and Nutrition, Adult Si sufre de diabetes, o diabetes mellitus, es muy importante tener hbitos alimenticios saludables debido a que sus niveles de Psychologist, counselling sangre (glucosa) se ven afectados en gran medida por lo que come y bebe. Comer alimentos saludables en las cantidades correctas, aproximadamente a la misma hora todos los [place], [place] ayudar a: Chief Operating Officer su glucemia. Disminuir el riesgo de sufrir una enfermedad cardaca. Mejorar la presin arterial. Barista o mantener un peso saludable. Qu puede afectar mi plan de alimentacin? Todas las personas que sufren de diabetes son diferentes y cada una tiene necesidades diferentes en cuanto a un plan de alimentacin. El mdico puede recomendarle que trabaje con un nutricionista para elaborar el mejor plan para usted. Su plan de alimentacin puede variar segn factores como: Las caloras que  necesita. Los medicamentos que toma. Su peso. Sus niveles de glucemia, presin arterial y colesterol. Su nivel de [place]. Otras afecciones que tenga, como enfermedades cardacas o renales. Cmo me afectan los carbohidratos? Los carbohidratos, o hidratos de carbono, afectan su nivel de glucemia ms que cualquier otro tipo de alimento. La ingesta de carbohidratos aumenta la cantidad de [employer]. Es importante conocer la cantidad de carbohidratos que se pueden ingerir en cada comida sin correr Surveyor, minerals. Esto es Government social research officer. Su nutricionista puede ayudarlo a calcular la cantidad de carbohidratos que debe ingerir en cada comida y en cada refrigerio. Cmo me afecta el alcohol? El alcohol puede provocar una disminucin de la glucemia (hipoglucemia), especialmente si [place] insulina o toma determinados medicamentos por va oral para la diabetes. La hipoglucemia es una afeccin potencialmente mortal. Los sntomas de la hipoglucemia, como somnolencia, mareos y confusin, son similares a los sntomas de haber consumido demasiado alcohol. No beba alcohol si: Su mdico le indica no hacerlo. Est embarazada, puede estar embarazada o est tratando de [place]. Si bebe alcohol: Limite la cantidad que bebe a lo siguiente: De 0 a 1 medida por da para las mujeres. De 0 a 2 medidas por da para los hombres. Sepa cunta cantidad de alcohol hay en las bebidas que toma. En los [address], una medida equivale a una botella de cerveza de 12 oz (355 ml), un vaso de vino de 5 oz (148 ml) o un vaso de una bebida alcohlica de alta graduacin de 1 oz (44 ml). Mantngase hidratado bebiendo agua, refrescos dietticos o t helado sin azcar. Tenga en cuenta que los refrescos comunes, los jugos y otras bebidas para mezclar pueden contener Product/process development scientist y se deben contar como carbohidratos. Consejos para seguir Social worker las etiquetas de los alimentos Comience por leer el  tamao de la porcin en la etiqueta de Informacin nutricional de los alimentos envasados y las bebidas. La cantidad de caloras, carbohidratos, grasas  y otros nutrientes detallados en la etiqueta se basan en una porcin del alimento. Muchos alimentos contienen ms de una porcin por envase. Verifique la cantidad total de gramos (g) de carbohidratos totales en una porcin. Verifique la cantidad de gramos de grasas saturadas y grasas trans en una porcin. Escoja alimentos que no contengan estas grasas o que su contenido de estas sea [place]. Verifique la cantidad de miligramos (mg) de sal (sodio) en una porcin. La [employer]son de las personas deben limitar la ingesta de sodio total a menos de 2300 mg Google. Siempre consulte la informacin nutricional de los alimentos etiquetados como "con bajo contenido de grasa" o "sin grasa". Estos alimentos pueden tener un mayor contenido de International aid/development worker agregada o carbohidratos refinados, y deben evitarse. Hable con su nutricionista para identificar sus objetivos diarios en cuanto a los nutrientes mencionados en la etiqueta. Al ir de compras Evite comprar alimentos procesados, enlatados o precocidos. Estos alimentos tienden a Counselling psychologist mayor cantidad de [place], sodio y azcar agregada. Compre en la zona exterior de la tienda de comestibles. Esta es la zona donde se encuentran con mayor frecuencia las frutas y las verduras frescas, los cereales a granel, las carnes frescas y los productos lcteos frescos. Al cocinar Use mtodos de coccin a baja temperatura, como hornear, en lugar de mtodos de coccin a alta temperatura, como frer en abundante aceite. Cocine con aceites saludables, como el aceite de [place], canola o [place]. Evite cocinar con manteca, crema o carnes con alto contenido de grasa. Planificacin de las comidas Coma las comidas y los refrigerios regularmente, preferentemente a la misma hora todos [place]. Evite pasar largos perodos de tiempo sin comer. Consuma  alimentos ricos en fibra, como frutas frescas, verduras, frijoles y cereales integrales. Consuma entre 4 y 6 onzas (entre 112 y 168 g) de protenas magras por da, como carnes [place], pollo, pescado, huevos o tofu. Una onza (oz) (28 g) de protena magra equivale a: 1 onza (28 g) de carne, pollo o pescado. 1 huevo.  taza (62 g) de tofu. Coma algunos alimentos por da que contengan grasas saludables, como aguacates, frutos secos, semillas y pescado. Qu alimentos debo comer? Nils Pyle Bayas. Manzanas. Naranjas. Duraznos. Damascos. Ciruelas. Uvas. Mangos. Papayas. Granadas. Kiwi. Cerezas. Verduras Verduras de [employer]ott, que incluyen [place], [place], col rizada, acelga, hojas de berza, hojas de mostaza y repollo. Remolachas. Coliflor. Brcoli. Zanahorias. Judas verdes. Tomates. Pimientos. Cebollas. Pepinos. Coles de Bruselas. Granos Granos integrales, como panes, galletas, tortillas, cereales y pastas de salvado o integrales. Avena sin azcar. Quinua. Arroz integral o salvaje. Carnes y otras protenas Frutos de mar. Carne de ave sin piel. Cortes magros de ave y carne de res. Tofu. Frutos secos. Semillas. Lcteos Productos lcteos sin grasa o con bajo contenido de [place], [place], yogur y [place]. Es posible que los productos detallados arriba no constituyan una lista completa de los alimentos y las bebidas que puede tomar. Consulte a un nutricionista para obtener ms informacin. Qu alimentos debo evitar? Nils Pyle Frutas enlatadas al almbar. Verduras Verduras enlatadas. Verduras congeladas con mantequilla o salsa de crema. Granos Productos elaborados con [place] y [place], como panes, pastas, bocadillos y cereales. Evite todos los alimentos procesados. Carnes y [address] de carne con alto contenido de Holiday representative. Carne de ave con piel. Carnes empanizadas o fritas. Carne procesada. Evite las grasas saturadas. Lcteos Yogur, queso o [employer] Health. Bebidas Bebidas  azucaradas, como gaseosas o t helado. Es posible que los productos que se enumeran ms  arriba no constituyan [employer]on de los alimentos y las bebidas que Personnel officer. Consulte a un nutricionista para obtener ms informacin. Preguntas para hacerle al mdico Debo consultar con un especialista certificado en atencin y educacin sobre la diabetes? Es necesario que me rena con un nutricionista? A qu nmero puedo llamar si tengo preguntas? Cules son los mejores momentos para controlar la glucemia? Dnde encontrar ms informacin: American Diabetes Association (Asociacin Estadounidense de la Diabetes): diabetes.org Academy of Nutrition and Dietetics (Academia de Nutricin y Pension scheme manager): eatright.Dana Corporation of Diabetes and Digestive and Kidney Diseases [employer]ere & Company de la Diabetes y las Enfermedades Digestivas y Renales): StageSync.si Association of Diabetes Care & Education Specialists (Asociacin de Especialistas en Atencin y Educacin sobre la Diabetes): diabeteseducator.org Resumen Es importante tener hbitos alimenticios saludables debido a que sus niveles de Psychologist, counselling sangre (glucosa) se ven afectados en gran medida por lo que come y bebe. Es importante consumir alcohol con prudencia. Un plan de comidas saludable lo ayudar a controlar la glucosa en sangre y a reducir el riesgo de enfermedades cardacas. El mdico puede recomendarle que trabaje con un nutricionista para elaborar el mejor plan para usted. Esta informacin no tiene Theme park manager el consejo del mdico. Asegrese de hacerle al mdico cualquier pregunta que tenga. Document Revised: 04/30/2020 Document Reviewed: 04/30/2020 Elsevier Patient Education  2024 Elsevier Inc.     Edwina Barth, MD [facility]

## 2023-09-29 NOTE — Assessment & Plan Note (Addendum)
Secondary to uncontrolled diabetes Recommend to start Lotrisone cream twice a day for the next 7 days Need for tight diabetes control discussed with patient

## 2023-09-29 NOTE — Assessment & Plan Note (Signed)
Well compensated liver cirrhosis No longer drinking. No complications.  No signs of hepatic encephalopathy. Clinically euvolemic.  No significant ascites.  No signs of peritonitis No peripheral edema. Continue Aldactone 50 mg daily, furosemide 20 mg daily and carvedilol 3.125 mg twice a day

## 2023-09-29 NOTE — Assessment & Plan Note (Signed)
Lab Results  Component Value Date   HGBA1C 9.6 (A) 09/29/2023  Uncontrolled diabetes with hemoglobin A1c of 9.6 Cardiovascular risks associated with uncontrolled diabetes discussed Diet and nutrition discussed Continue metformin 500 mg twice a day and start glipizide 5 mg twice a day Hypoglycemia precautions given Blood work done today Follow-up in 3 months

## 2023-10-01 ENCOUNTER — Telehealth: Payer: Self-pay | Admitting: Emergency Medicine

## 2023-10-01 NOTE — Telephone Encounter (Signed)
Results discussed with patient on the phone today. I went over her medication list with the patient Is taking metformin glipizide, and carvedilol twice a day Taking Lasix 2-3 times a week depending on edema Doing well.  No other concerns.

## 2023-11-06 ENCOUNTER — Other Ambulatory Visit: Payer: Self-pay | Admitting: Emergency Medicine

## 2023-11-06 DIAGNOSIS — N529 Male erectile dysfunction, unspecified: Secondary | ICD-10-CM

## 2023-11-07 ENCOUNTER — Other Ambulatory Visit: Payer: Self-pay | Admitting: Emergency Medicine

## 2023-11-07 DIAGNOSIS — N529 Male erectile dysfunction, unspecified: Secondary | ICD-10-CM

## 2023-11-07 DIAGNOSIS — E1165 Type 2 diabetes mellitus with hyperglycemia: Secondary | ICD-10-CM

## 2023-11-07 NOTE — Telephone Encounter (Signed)
 Copied from CRM [id number]. Topic: Clinical - Medication Refill >> Nov 07, 2023  7:58 AM Theodis Sato wrote: Most Recent Primary Care Visit:  Provider: Georgina Quint  Department: [facility]  Visit Type: OFFICE VISIT  Date: 09/29/2023  Medication: metFORMIN (GLUCOPHAGE) 500 MG tablet sildenafil (VIAGRA) 100 MG tablet   Has the patient contacted their pharmacy? No, out refills or expired prescription.  (Agent: If no, request that the patient contact the pharmacy for the refill. If patient does not wish to contact the pharmacy document the reason why and proceed with request.) (Agent: If yes, when and what did the pharmacy advise?)  Is this the correct pharmacy for this prescription? Yes If no, delete pharmacy and type the correct one.  This is the patient's preferred pharmacy:  [id number] - [place] ([place]), [place] - [address]  ([place]) [place] [address] Phone: [phone] Fax: [phone]   Has the prescription been filled recently? Yes  Is the patient out of the medication? Yes for 2 days already.   Has the patient been seen for an appointment in the last year OR does the patient have an upcoming appointment? Yes  Can we respond through MyChart? No  Agent: Please be advised that Rx refills may take up to 3 business days. We ask that you follow-up with your pharmacy.

## 2023-11-08 MED ORDER — METFORMIN HCL 500 MG PO TABS: 500.0000 mg | ORAL_TABLET | Freq: Two times a day (BID) | ORAL | 0 refills | Status: DC

## 2023-11-19 ENCOUNTER — Other Ambulatory Visit: Payer: Self-pay | Admitting: Emergency Medicine

## 2023-11-19 DIAGNOSIS — N529 Male erectile dysfunction, unspecified: Secondary | ICD-10-CM

## 2023-12-28 ENCOUNTER — Ambulatory Visit (INDEPENDENT_AMBULATORY_CARE_PROVIDER_SITE_OTHER): Payer: Self-pay | Admitting: Emergency Medicine

## 2023-12-28 ENCOUNTER — Encounter: Payer: Self-pay | Admitting: Emergency Medicine

## 2023-12-28 VITALS — BP 122/80 | HR 63 | Temp 98.2°F | Ht 65.0 in | Wt 188.0 lb

## 2023-12-28 DIAGNOSIS — Z23 Encounter for immunization: Secondary | ICD-10-CM

## 2023-12-28 DIAGNOSIS — E1165 Type 2 diabetes mellitus with hyperglycemia: Secondary | ICD-10-CM

## 2023-12-28 DIAGNOSIS — Z7984 Long term (current) use of oral hypoglycemic drugs: Secondary | ICD-10-CM

## 2023-12-28 DIAGNOSIS — K766 Portal hypertension: Secondary | ICD-10-CM

## 2023-12-28 DIAGNOSIS — N529 Male erectile dysfunction, unspecified: Secondary | ICD-10-CM

## 2023-12-28 DIAGNOSIS — K7030 Alcoholic cirrhosis of liver without ascites: Secondary | ICD-10-CM

## 2023-12-28 LAB — POCT GLYCOSYLATED HEMOGLOBIN (HGB A1C): Hemoglobin A1C: 5.4 % (ref 4.0–5.6)

## 2023-12-28 MED ORDER — METFORMIN HCL 500 MG PO TABS: 500.0000 mg | ORAL_TABLET | Freq: Two times a day (BID) | ORAL | 3 refills | Status: DC

## 2023-12-28 MED ORDER — GLIPIZIDE 5 MG PO TABS: 5.0000 mg | ORAL_TABLET | Freq: Two times a day (BID) | ORAL | 3 refills | Status: DC

## 2023-12-28 MED ORDER — SILDENAFIL CITRATE 100 MG PO TABS: 100.0000 mg | ORAL_TABLET | ORAL | 5 refills | Status: DC | PRN

## 2023-12-28 NOTE — Patient Instructions (Signed)
 Lesin de largo plazo en el hgado (cirrosis): Qu debe saber Long-Term Liver Injury (Cirrhosis): What to Know  La cirrosis es una lesin de largo plazo (crnica) en el hgado. El hgado es un rgano del cuerpo que se encarga de muchas tareas. El hgado: Convierte los alimentos en energa. Elimina las sustancias txicas de la [place]. Produce protenas. Absorbe las vitaminas de los alimentos. Cuando uno tiene cirrosis, el tejido cicatricial reemplaza a las clulas hepticas sanas. Esto impide que la sangre circule por el hgado. Le dificulta al hgado hacer sus tareas. Cules son las causas? Las causas de la cirrosis pueden ser las siguientes: Hepatitis C. Beber mucho alcohol durante un [employer]. Enfermedades, como: Enfermedad por hgado graso no alcohlico [place]). Tambin se denomina enfermedad heptica esteatsica asociada a disfuncin metablica (EHEADM). Enfermedades que obstruyen los conductos hepticos. Enfermedades hepticas con las [employer]. Hepatitis B. Hepatitis autoinmune. Sucede cuando el sistema de defensa del organismo (sistema inmunitario) ataca a las clulas del hgado por error. Una infeccin por un parsito. Una reaccin a cosas a las que ha estado expuesto durante mucho tiempo como, por ejemplo: Ciertos medicamentos que toma. Estos incluyen los medicamentos para el corazn. Ciertas toxinas. Estas incluyen los solventes orgnicos. Qu incrementa el riesgo? Una persona es ms propensa a tener cirrosis si: Tiene hepatitis. Bebe mucho alcohol durante un largo tiempo. Tiene sobrepeso. Es hombre. Es mayor de 40 aos. [place] drogas intravenosas y comparte agujas. Mantiene relaciones sexuales sin proteccin y la otra persona tiene hepatitis. Cules son los signos o sntomas? Es posible que no tenga sntomas al principio. Los sntomas pueden comenzar cuando el dao en el hgado [place]. Entre los primeros sntomas, se pueden incluir los siguientes: Sensacin de  debilidad y [place]. Dificultad para dormir. Picazn en la piel. Dolor a Building services engineer. Bajar de [place] o no tener hambre como de [place]. Ganas de vomitar. Prdida muscular y calambres. Entre los sntomas que aparecen en una etapa posterior se pueden incluir los siguientes: Ictericia. Sucede cuando la piel y la parte blanca de los ojos se ponen amarillos. Acumulacin de lquido en el vientre. La ropa puede quedarle ajustada en la cintura. Aumento de peso e hinchazn de los pies y los tobillos. Dificultad para respirar. Tener hematomas o hemorragias. Vomitar sangre. Heces negras o con sangre. Sentirse confundido. Cmo se diagnostica? La cirrosis se puede diagnosticar en funcin de los sntomas, los antecedentes mdicos y un examen. El mdico podra palparle el hgado. Tambin podra necesitar pruebas. Pueden incluir: Anlisis de sangre para controlar lo siguiente: Si tiene hepatitis B o C. Cmo funcionan los riones. Cmo funciona el hgado. Pruebas de diagnstico por imgenes, por ejemplo: Tomografa computarizada o resonancia magntica. Una ecografa. Con esta se puede revisar si el tejido heptico es reemplazado por tejido cicatricial. Cmo se trata? El tratamiento puede depender de la magnitud del dao heptico y qu lo caus. Esto quiere Quarry manager se tratan los sntomas o los problemas que causaron la cirrosis. El tratamiento puede incluir: Hacer cambios en sus alimentos y bebidas. Es posible que deba hacer lo siguiente: Consumir una dieta saludable. Trabajar con el mdico para elaborar un plan de alimentacin. Consumir menos sal. Mantener un peso saludable. No consumir drogas ni alcohol. Tomar medicamentos para: Tratar infecciones. Ayudar a la picazn. Disminuir la acumulacin de lquido. Reducir el nivel de ciertas toxinas en la sangre. Reducir el riesgo de tener una hemorragia. Recibir un trasplante de hgado. Esto se hace si usted tiene insuficiencia  heptica. Siga  estas indicaciones en su casa: Use sus medicamentos nicamente segn las indicaciones. No tome medicamentos que sean perjudiciales para el hgado. Consulte al mdico antes de tomar cualquier medicamento nuevo. Consulte antes de tomar acetaminofeno. Descanse todo lo que sea necesario. Siga una dieta saludable. Consuma comidas ms pequeas cada 3 o 4 horas. Limite el consumo de sal o agua como se lo hayan indicado. Evite: Mariscos, pescado y carne crudos o a Merchant navy officer. Leche y productos lcteos sin Market researcher. No beba alcohol. Concurra a todas las visitas de seguimiento. El mdico controlar si su situacin mejora. Comunquese con un mdico si: Tiene cansancio o debilidad que empeoran. Tiene hinchazn en: [facility]. Los pies. Las piernas. Se le acumula lquido en el vientre. Tiene fiebre o siente escalofros. Aumenta o baja de peso. Vomita o siente ganas de vomitar. Tiene ictericia. Empieza a tener moretones o Environmental consultant. Solicite ayuda de inmediato si: Vomita, y el vmito tiene el siguiente aspecto: Sangre de color rojo brillante. Borra de caf. Su materia fecal se ve sanguinolenta o negra. Se siente confundido. Tiene dolor en el pecho o dificultad para respirar. Estos sntomas pueden Customer service manager. Llame al 911 de inmediato. No espere a ver si los sntomas desaparecen. No conduzca por sus propios medios [employer]. Esta informacin no tiene Theme park manager el consejo del mdico. Asegrese de hacerle al mdico cualquier pregunta que tenga. Document Revised: 04/05/2023 Document Reviewed: 04/05/2023 Elsevier Patient Education  2024 [employer].

## 2023-12-28 NOTE — Assessment & Plan Note (Signed)
 Much improved diabetes with hemoglobin A1c of 5.4 Continue metformin  500 mg twice a day and glipizide  5 mg twice a day Cardiovascular risks associated with diabetes discussed Diet and nutrition discussed Follow-up in 3 months

## 2023-12-28 NOTE — Assessment & Plan Note (Signed)
Well compensated liver cirrhosis No longer drinking. No complications.  No signs of hepatic encephalopathy. Clinically euvolemic.  No significant ascites.  No signs of peritonitis No peripheral edema. Continue Aldactone 50 mg daily, furosemide 20 mg daily and carvedilol 3.125 mg twice a day

## 2023-12-28 NOTE — Progress Notes (Signed)
 Wesley Williams 53 y.o.   Chief Complaint  Patient presents with   Follow-up    3 month f/u. Pt c/o having some cramps in his legs some times also back pain that comes and goes     HISTORY OF PRESENT ILLNESS: This is a 53 y.o. male here for 48-month follow-up of chronic medical conditions including diabetes and liver cirrhosis Still working.  Physical work.  Has occasional cramps to his lower legs and occasional back pain most likely related to work activities.  No significant ascites. BP Readings from Last 3 Encounters:  09/29/23 118/82  03/21/23 138/74  09/28/22 (!) 143/106   Wt Readings from Last 3 Encounters:  12/28/23 188 lb (85.3 kg)  09/29/23 178 lb (80.7 kg)  03/21/23 192 lb 6 oz (87.3 kg)     HPI   Prior to Admission medications   Medication Sig Start Date End Date Taking? Authorizing Provider  acetaminophen  (TYLENOL ) 500 MG tablet Take 500 mg by mouth every 6 (six) hours as needed (pain).   Yes [provider]  carvedilol  (COREG ) 3.125 MG tablet Take 1 tablet (3.125 mg total) by mouth 2 (two) times daily. 03/21/23 03/20/24 Yes Akashdeep Chuba Darnelle, MD  diclofenac  Sodium (VOLTAREN ) 1 % GEL Apply 2 g topically 4 (four) times daily. 09/28/22  Yes Aberman, Girtha Lama, PA-C  ferrous sulfate  325 (65 FE) MG tablet Take 325 mg by mouth daily with breakfast.   Yes [provider]  furosemide  (LASIX ) 20 MG tablet Take 1 tablet (20 mg total) by mouth daily. 03/21/23  Yes Darnell Jeschke, Isidro Margo, MD  glipiZIDE  (GLUCOTROL ) 5 MG tablet Take 1 tablet (5 mg total) by mouth 2 (two) times daily before a meal. 09/29/23  Yes Errin Chewning, Isidro Margo, MD  metFORMIN  (GLUCOPHAGE ) 500 MG tablet Take 1 tablet (500 mg total) by mouth 2 (two) times daily with a meal. 11/08/23 02/06/24 Yes Creighton Longley, Isidro Margo, MD  sildenafil  (VIAGRA ) 100 MG tablet TAKE 1/2 TO 1 (ONE-HALF TO ONE) TABLET BY MOUTH ONCE DAILY AS NEEDED FOR ERECTILE DYSFUNCTION 11/19/23  Yes Danea Manter, Isidro Margo, MD  spironolactone   (ALDACTONE ) 50 MG tablet Take 1 tablet (50 mg total) by mouth daily. 03/21/23  Yes Elvira Hammersmith, MD    No Known Allergies  Patient Active Problem List   Diagnosis Date Noted   Balanitis 09/29/2023   Thrombocytopenia (HCC) 09/08/2022   Type 2 diabetes mellitus with hyperglycemia, without long-term current use of insulin  (HCC) 12/28/2021   Secondary esophageal varices without bleeding (HCC)    Rectal varices    Portal hypertension (HCC)-colopathy    Alcoholic cirrhosis of liver without ascites (HCC) 03/18/2020   Pancytopenia (HCC)    Anemia 12/12/2017   Erectile dysfunction 12/12/2017    Past Medical History:  Diagnosis Date   Alcohol abuse    Cirrhosis (HCC)    Hypertension    Portal hypertension (HCC)-colopathy    Rhabdomyolysis 04/23/2016   Secondary esophageal varices without bleeding (HCC)    Type 2 diabetes mellitus (HCC)     Past Surgical History:  Procedure Laterality Date   COLONOSCOPY WITH PROPOFOL  N/A 03/19/2020   Procedure: COLONOSCOPY WITH PROPOFOL ;  Surgeon: Kenney Peacemaker, MD;  Location: [facility] ENDOSCOPY;  Service: Endoscopy;  Laterality: N/A;   ESOPHAGEAL BANDING  03/19/2020   Procedure: ESOPHAGEAL BANDING;  Surgeon: Kenney Peacemaker, MD;  Location[facility] END[facility]COPY;  Service: Endoscopy;;   ESOPHAGEAL BANDING N/A 04/07/2020   Procedure: ESOPHAGEAL BANDING;  Surgeon: Kenney Peacemaker, MD;  Location:  [facility] ENDOSCOPY;  Service: Endoscopy;  Laterality: N/A;   ESOPHAGEAL BANDING N/A 05/19/2020   Procedure: ESOPHAGEAL BANDING;  Surgeon: Lajuan Pila, MD;  Location: [facility] ENDOSCOPY;  Service: Endoscopy;  Laterality: N/A;   ESOPHAGEAL BANDING  05/09/2022   Procedure: ESOPHAGEAL BANDING;  Surgeon: Elois Hair, MD;  Location: [facility] ENDOSCOPY;  Service: Gastroenterology;;   ESOPHAGEAL BANDING  06/14/2022   Procedure: ESOPHAGEAL BANDING;  Surgeon: Kenney Peacemaker, MD;  Location: [facility] ENDOSCOPY;  Service: Gastroenterology;;   ESOPHAGEAL BANDING  08/10/2022   Procedure: ESOPHAGEAL  BANDING;  Surgeon: Kenney Peacemaker, MD;  Location: [facility] ENDOSCOPY;  Service: Gastroenterology;;   ESOPHAGOGASTRODUODENOSCOPY (EGD) WITH PROPOFOL  N/A 03/19/2020   Procedure: ESOPHAGOGASTRODUODENOSCOPY (EGD) WITH PROPOFOL ;  Surgeon: Kenney Peacemaker, MD;  Location: [facility] ENDOSCOPY;  Service: Endoscopy;  Laterality: N/A;   ESOPHAGOGASTRODUODENOSCOPY (EGD) WITH PROPOFOL  N/A 04/07/2020   Procedure: ESOPHAGOGASTRODUODENOSCOPY (EGD) WITH PROPOFOL ;  Surgeon: Kenney Peacemaker, MD;  Location: [facility] ENDOSCOPY;  Service: Endoscopy;  Laterality: N/A;   ESOPHAGOGASTRODUODENOSCOPY (EGD) WITH PROPOFOL  N/A 05/19/2020   Procedure: ESOPHAGOGASTRODUODENOSCOPY (EGD) WITH PROPOFOL ;  Surgeon: Lajuan Pila, MD;  Location: [facility] ENDOSCOPY;  Service: Endoscopy;  Laterality: N/A;   ESOPHAGOGASTRODUODENOSCOPY (EGD) WITH PROPOFOL  N/A 05/09/2022   Procedure: ESOPHAGOGASTRODUODENOSCOPY (EGD) WITH PROPOFOL ;  Surgeon: Elois Hair, MD;  Location: [facility] ENDOSCOPY;  Service: Gastroenterology;  Laterality: N/A;   ESOPHAGOGASTRODUODENOSCOPY (EGD) WITH PROPOFOL  N/A 06/14/2022   Procedure: ESOPHAGOGASTRODUODENOSCOPY (EGD) WITH PROPOFOL ;  Surgeon: Kenney Peacemaker, MD;  Location: [facility] ENDOSCOPY;  Service: Gastroenterology;  Laterality: N/A;   ESOPHAGOGASTRODUODENOSCOPY (EGD) WITH PROPOFOL  N/A 08/10/2022   Procedure: ESOPHAGOGASTRODUODENOSCOPY (EGD) WITH PROPOFOL ;  Surgeon: Kenney Peacemaker, MD;  Location: [facility] ENDOSCOPY;  Service: Gastroenterology;  Laterality: N/A;   ESOPHAGOGASTRODUODENOSCOPY (EGD) WITH PROPOFOL  N/A 09/28/2022   Procedure: ESOPHAGOGASTRODUODENOSCOPY (EGD) WITH PROPOFOL ;  Surgeon: Kenney Peacemaker, MD;  Location: [facility] ENDOSCOPY;  Service: Gastroenterology;  Laterality: N/A;    Social History   Socioeconomic History   Marital status: Married    Spouse name: Not on file   Number of children: Not on file   Years of education: Not on file   Highest education level: Not on file  Occupational History   Not on file  Tobacco Use   Smoking  status: Never   Smokeless tobacco: Never  Vaping Use   Vaping status: Never Used  Substance and Sexual Activity   Alcohol use: Not Currently    Comment: former etoh abuse, none since july 2021.   Drug use: No   Sexual activity: Yes  Other Topics Concern   Not on file  Social History Narrative   Single lives w/ long-term girlfriend   Works for a tree service   Alcohol abuse hx   Never smoker, no drugs   Social Drivers of Corporate investment banker Strain: Not on file  Food Insecurity: No Food Insecurity (05/12/2022)   Hunger Vital Sign    Worried About Running Out of Food in the Last Year: Never true    Ran Out of Food in the Last Year: Never true  Transportation Needs: No Transportation Needs (05/12/2022)   PRAPARE - Administrator, Civil Service (Medical): No    Lack of Transportation (Non-Medical): No  Physical Activity: Not on file  Stress: Not on file  Social Connections: Not on file  Intimate Partner Violence: Not At Risk (05/12/2022)   Humiliation, Afraid, Rape, and Kick questionnaire    Fear of Current or Ex-Partner: No    Emotionally Abused: No  Physically Abused: No    Sexually Abused: No    Family History  Problem Relation Age of Onset   Diabetes Mother      Review of Systems  Constitutional: Negative.  Negative for chills and fever.  HENT: Negative.  Negative for congestion and sore throat.   Respiratory: Negative.  Negative for cough and shortness of breath.   Cardiovascular: Negative.  Negative for chest pain and palpitations.  Gastrointestinal:  Negative for abdominal pain, diarrhea, nausea and vomiting.  Genitourinary: Negative.  Negative for dysuria and hematuria.  Skin: Negative.  Negative for rash.  Neurological: Negative.  Negative for dizziness and headaches.  All other systems reviewed and are negative.   Today's Vitals   12/28/23 1041  BP: 122/80  Pulse: 63  Temp: 98.2 F (36.8 C)  TempSrc: Oral  SpO2: 97%  Weight: 188 lb  (85.3 kg)  Height: 5\' 5"  (1.651 m)   Body mass index is 31.28 kg/m.   Physical Exam Vitals reviewed.  Constitutional:      Appearance: Normal appearance.  HENT:     Head: Normocephalic.     Mouth/Throat:     Mouth: Mucous membranes are moist.     Pharynx: Oropharynx is clear.  Eyes:     Extraocular Movements: Extraocular movements intact.     Pupils: Pupils are equal, round, and reactive to light.  Cardiovascular:     Rate and Rhythm: Normal rate and regular rhythm.     Pulses: Normal pulses.     Heart sounds: Normal heart sounds.  Pulmonary:     Effort: Pulmonary effort is normal.     Breath sounds: Normal breath sounds.  Musculoskeletal:     Cervical back: No tenderness.  Lymphadenopathy:     Cervical: No cervical adenopathy.  Skin:    General: Skin is warm and dry.     Capillary Refill: Capillary refill takes less than 2 seconds.  Neurological:     General: No focal deficit present.     Mental Status: He is alert and oriented to person, place, and time.  Psychiatric:        Mood and Affect: Mood normal.        Behavior: Behavior normal.    Results for orders placed or performed in visit on 12/28/23 (from the past 24 hours)  POCT HgB A1C     Status: None   Collection Time: 12/28/23 11:03 AM  Result Value Ref Range   Hemoglobin A1C 5.4 4.0 - 5.6 %   HbA1c POC (<> result, manual entry)     HbA1c, POC (prediabetic range)     HbA1c, POC (controlled diabetic range)       ASSESSMENT & PLAN: Problem List Items Addressed This Visit       Cardiovascular and Mediastinum   Portal hypertension (HCC)-colopathy   Stable.  No concerns. No significant ascites. No signs of spontaneous bacterial peritonitis      Relevant Medications   sildenafil  (VIAGRA ) 100 MG tablet     Digestive   Alcoholic cirrhosis of liver without ascites (HCC) - Primary   Well compensated liver cirrhosis No longer drinking. No complications.  No signs of hepatic encephalopathy. Clinically  euvolemic.  No significant ascites.  No signs of peritonitis No peripheral edema. Continue Aldactone  50 mg daily, furosemide  20 mg daily and carvedilol  3.125 mg twice a day        Endocrine   Type 2 diabetes mellitus with hyperglycemia, without long-term current use of insulin  (HCC)  Much improved diabetes with hemoglobin A1c of 5.4 Continue metformin  500 mg twice a day and glipizide  5 mg twice a day Cardiovascular risks associated with diabetes discussed Diet and nutrition discussed Follow-up in 3 months      Relevant Medications   metFORMIN  (GLUCOPHAGE ) 500 MG tablet   glipiZIDE  (GLUCOTROL ) 5 MG tablet   Other Relevant Orders   POCT HgB A1C (Completed)     Other   Erectile dysfunction   Relevant Medications   sildenafil  (VIAGRA ) 100 MG tablet   Other Visit Diagnoses       Need for vaccination       Relevant Orders   Zoster Recombinant (Shingrix  )      Patient Instructions  Lesin de largo plazo en el hgado (cirrosis): Qu debe saber Long-Term Liver Injury (Cirrhosis): What to Know  La cirrosis es una lesin de largo plazo (crnica) en el hgado. El hgado es un rgano del cuerpo que se encarga de muchas tareas. El hgado: Convierte los alimentos en energa. Elimina las sustancias txicas de la [place]. Produce protenas. Absorbe las vitaminas de los alimentos. Cuando uno tiene cirrosis, el tejido cicatricial reemplaza a las clulas hepticas sanas. Esto impide que la sangre circule por el hgado. Le dificulta al hgado hacer sus tareas. Cules son las causas? Las causas de la cirrosis pueden ser las siguientes: Hepatitis C. Beber mucho alcohol durante un [employer]. Enfermedades, como: Enfermedad por hgado graso no alcohlico [facility]). Tambin se denomina enfermedad heptica esteatsica asociada a disfuncin metablica (EHEADM). Enfermedades que obstruyen los conductos hepticos. Enfermedades hepticas con las [employer]. Hepatitis B. Hepatitis autoinmune.  Sucede cuando el sistema de defensa del organismo (sistema inmunitario) ataca a las clulas del hgado por error. Una infeccin por un parsito. Una reaccin a cosas a las que ha estado expuesto durante mucho tiempo como, por ejemplo: Ciertos medicamentos que toma. Estos incluyen los medicamentos para el corazn. Ciertas toxinas. Estas incluyen los solventes orgnicos. Qu incrementa el riesgo? Una persona es ms propensa a tener cirrosis si: Tiene hepatitis. Bebe mucho alcohol durante un largo tiempo. Tiene sobrepeso. Es hombre. Es mayor de 40 aos. [place]  drogas intravenosas y comparte agujas. Mantiene relaciones sexuales sin proteccin y la otra persona tiene hepatitis. Cules son los signos o sntomas? Es posible que no tenga sntomas al principio. Los sntomas pueden comenzar cuando el dao en el hgado [place]. Entre los primeros sntomas, se pueden incluir los siguientes: Sensacin de debilidad y [place]. Dificultad para dormir. Picazn en la piel. Dolor a Building services engineer. Bajar de [place] o no tener hambre como de [place]. Ganas de vomitar. Prdida muscular y calambres. Entre los sntomas que aparecen en una etapa posterior se pueden incluir los siguientes: Ictericia. Sucede cuando la piel y la parte blanca de los ojos se ponen amarillos. Acumulacin de lquido en el vientre. La ropa puede quedarle ajustada en la cintura. Aumento de peso e hinchazn de los pies y los tobillos. Dificultad para respirar. Tener hematomas o hemorragias. Vomitar sangre. Heces negras o con sangre. Sentirse confundido. Cmo se diagnostica? La cirrosis se puede diagnosticar en funcin de los sntomas, los antecedentes mdicos y un examen. El mdico podra palparle el hgado. Tambin podra necesitar pruebas. Pueden incluir: Anlisis de sangre para controlar lo siguiente: Si tiene hepatitis B o C. Cmo funcionan los riones. Cmo funciona el hgado. Pruebas de diagnstico por  imgenes, por ejemplo: Tomografa computarizada o resonancia magntica. Una ecografa. Con esta se puede revisar si el tejido heptico es  reemplazado por tejido cicatricial. Cmo se trata? El tratamiento puede depender de la magnitud del dao heptico y qu lo caus. Esto quiere Quarry manager se tratan los sntomas o los problemas que causaron la cirrosis. El tratamiento puede incluir: Hacer cambios en sus alimentos y bebidas. Es posible que deba hacer lo siguiente: Consumir una dieta saludable. Trabajar con el mdico para elaborar un plan de alimentacin. Consumir menos sal. Mantener un peso saludable. No consumir drogas ni alcohol. Tomar medicamentos para: Tratar infecciones. Ayudar a la picazn. Disminuir la acumulacin de lquido. Reducir el nivel de ciertas toxinas en la sangre. Reducir el riesgo de tener una hemorragia. Recibir un trasplante de hgado. Esto se hace si usted tiene insuficiencia heptica. Siga estas indicaciones en su casa: Use sus medicamentos nicamente segn las indicaciones. No tome medicamentos que sean perjudiciales para el hgado. Consulte al mdico antes de tomar cualquier medicamento nuevo. Consulte antes de tomar acetaminofeno. Descanse todo lo que sea necesario. Siga una dieta saludable. Consuma comidas ms pequeas cada 3 o 4 horas. Limite el consumo de sal o agua como se lo hayan indicado. Evite: Mariscos, pescado y carne crudos o a medio cocer. Leche y productos lcteos sin Market researcher. No beba alcohol. Concurra a todas las visitas de seguimiento. El mdico controlar si su situacin mejora. Comunquese con un mdico si: Tiene cansancio o debilidad que empeoran. Tiene hinchazn en: [employer]. Los pies. Las piernas. Se le acumula lquido en el vientre. Tiene fiebre o siente escalofros. Aumenta o baja de peso. Vomita o siente ganas de vomitar. Tiene ictericia. Empieza a tener moretones o Environmental consultant. Solicite ayuda de inmediato  si: Vomita, y el vmito tiene el siguiente aspecto: Sangre de color rojo brillante. Borra de caf. Su materia fecal se ve sanguinolenta o negra. Se siente confundido. Tiene dolor en el pecho o dificultad para respirar. Estos sntomas pueden Customer service manager. Llame al 911 de inmediato. No espere a ver si los sntomas desaparecen. No conduzca por sus propios medios [employer]. Esta informacin no tiene Theme park manager el consejo del mdico. Asegrese de hacerle al mdico cualquier pregunta que tenga. Document Revised: 04/05/2023 Document Reviewed: 04/05/2023 Elsevier Patient Education  2024 Elsevier Inc.     Maryagnes Small, MD [facility]

## 2023-12-28 NOTE — Assessment & Plan Note (Signed)
 Stable.  No concerns. No significant ascites. No signs of spontaneous bacterial peritonitis

## 2024-04-09 ENCOUNTER — Ambulatory Visit (INDEPENDENT_AMBULATORY_CARE_PROVIDER_SITE_OTHER): Payer: Self-pay | Admitting: Emergency Medicine

## 2024-04-09 ENCOUNTER — Ambulatory Visit: Payer: Self-pay | Admitting: Emergency Medicine

## 2024-04-09 ENCOUNTER — Encounter: Payer: Self-pay | Admitting: Emergency Medicine

## 2024-04-09 VITALS — BP 138/76 | HR 69 | Temp 97.8°F | Ht 65.0 in | Wt 193.6 lb

## 2024-04-09 DIAGNOSIS — I8510 Secondary esophageal varices without bleeding: Secondary | ICD-10-CM

## 2024-04-09 DIAGNOSIS — D696 Thrombocytopenia, unspecified: Secondary | ICD-10-CM

## 2024-04-09 DIAGNOSIS — E1165 Type 2 diabetes mellitus with hyperglycemia: Secondary | ICD-10-CM

## 2024-04-09 DIAGNOSIS — Z7984 Long term (current) use of oral hypoglycemic drugs: Secondary | ICD-10-CM

## 2024-04-09 DIAGNOSIS — N529 Male erectile dysfunction, unspecified: Secondary | ICD-10-CM

## 2024-04-09 DIAGNOSIS — K766 Portal hypertension: Secondary | ICD-10-CM

## 2024-04-09 DIAGNOSIS — D61818 Other pancytopenia: Secondary | ICD-10-CM

## 2024-04-09 DIAGNOSIS — K7030 Alcoholic cirrhosis of liver without ascites: Secondary | ICD-10-CM

## 2024-04-09 LAB — CBC WITH DIFFERENTIAL/PLATELET
Basophils Absolute: 0 K/uL (ref 0.0–0.1)
Basophils Relative: 0.6 % (ref 0.0–3.0)
Eosinophils Absolute: 0.1 K/uL (ref 0.0–0.7)
Eosinophils Relative: 2.5 % (ref 0.0–5.0)
HCT: 38.9 % — ABNORMAL LOW (ref 39.0–52.0)
Hemoglobin: 13.4 g/dL (ref 13.0–17.0)
Lymphocytes Relative: 8.4 % — ABNORMAL LOW (ref 12.0–46.0)
Lymphs Abs: 0.3 K/uL — ABNORMAL LOW (ref 0.7–4.0)
MCHC: 34.6 g/dL (ref 30.0–36.0)
MCV: 89.9 fl (ref 78.0–100.0)
Monocytes Absolute: 0.4 K/uL (ref 0.1–1.0)
Monocytes Relative: 11.5 % (ref 3.0–12.0)
Neutro Abs: 2.6 K/uL (ref 1.4–7.7)
Neutrophils Relative %: 77 % (ref 43.0–77.0)
Platelets: 50 K/uL — ABNORMAL LOW (ref 150.0–400.0)
RBC: 4.33 Mil/uL (ref 4.22–5.81)
RDW: 15.3 % (ref 11.5–15.5)
WBC: 3.4 K/uL — ABNORMAL LOW (ref 4.0–10.5)

## 2024-04-09 LAB — COMPREHENSIVE METABOLIC PANEL WITH GFR
ALT: 35 U/L (ref 0–53)
AST: 32 U/L (ref 0–37)
Albumin: 3.5 g/dL (ref 3.5–5.2)
Alkaline Phosphatase: 90 U/L (ref 39–117)
BUN: 12 mg/dL (ref 6–23)
CO2: 25 meq/L (ref 19–32)
Calcium: 9.1 mg/dL (ref 8.4–10.5)
Chloride: 106 meq/L (ref 96–112)
Creatinine, Ser: 0.63 mg/dL (ref 0.40–1.50)
GFR: 107.99 mL/min (ref >60.00)
Glucose, Bld: 319 mg/dL — ABNORMAL HIGH (ref 70–99)
Potassium: 5 meq/L (ref 3.5–5.1)
Sodium: 136 meq/L (ref 135–145)
Total Bilirubin: 1 mg/dL (ref 0.2–1.2)
Total Protein: 7 g/dL (ref 6.0–8.3)

## 2024-04-09 LAB — LIPID PANEL
Cholesterol: 170 mg/dL (ref 0–200)
HDL: 43.1 mg/dL (ref >39.00)
LDL Cholesterol: 101 mg/dL — ABNORMAL HIGH (ref 0–99)
NonHDL: 126.71
Total CHOL/HDL Ratio: 4
Triglycerides: 131 mg/dL (ref 0.0–149.0)
VLDL: 26.2 mg/dL (ref 0.0–40.0)

## 2024-04-09 LAB — HEMOGLOBIN A1C: Hgb A1c MFr Bld: 7.8 % — ABNORMAL HIGH (ref 4.6–6.5)

## 2024-04-09 MED ORDER — SILDENAFIL CITRATE 100 MG PO TABS: 100.0000 mg | ORAL_TABLET | ORAL | 5 refills | Status: AC | PRN

## 2024-04-09 MED ORDER — GLIPIZIDE 5 MG PO TABS: 5.0000 mg | ORAL_TABLET | Freq: Two times a day (BID) | ORAL | 3 refills | Status: DC

## 2024-04-09 NOTE — Assessment & Plan Note (Signed)
Well compensated liver cirrhosis No longer drinking. No complications.  No signs of hepatic encephalopathy. Clinically euvolemic.  No significant ascites.  No signs of peritonitis No peripheral edema. Continue Aldactone 50 mg daily, furosemide 20 mg daily and carvedilol 3.125 mg twice a day

## 2024-04-09 NOTE — Assessment & Plan Note (Signed)
No clinical bleeding episodes.  No petechia or ecchymosis CBC done today

## 2024-04-09 NOTE — Patient Instructions (Signed)
 Mantenimiento de Research officer, political party, Male Adoptar un estilo de vida saludable y recibir atencin preventiva son importantes para promover la salud y Counsellor. Consulte al mdico sobre: El esquema adecuado para hacerse pruebas y exmenes peridicos. Cosas que puede hacer por su cuenta para prevenir enfermedades y [place] sano. Qu debo saber sobre la dieta, el peso y el ejercicio? Consuma una dieta saludable  Consuma una dieta que incluya muchas verduras, frutas, productos lcteos con bajo contenido de [place] y Associate Professor. No consuma muchos alimentos ricos en grasas slidas, azcares agregados o sodio. Mantenga un peso saludable El ndice de masa muscular [facility]) es una medida que puede utilizarse para identificar posibles problemas de [place]. Proporciona una estimacin de la grasa corporal basndose en el peso y la altura. Su mdico puede ayudarle a Engineer, site [facility] y a Personnel officer o Pharmacologist un peso saludable. Haga ejercicio con regularidad Haga ejercicio con regularidad. Esta es una de las prcticas ms importantes que puede hacer por su salud. La [employer] de los adultos deben seguir estas pautas: Education officer, environmental, al menos, 150 minutos de actividad fsica por semana. El ejercicio debe aumentar la frecuencia cardaca y Media planner transpirar (ejercicio de intensidad moderada). Hacer ejercicios de fortalecimiento por lo [employer] por semana. Agregue esto a su plan de ejercicio de intensidad moderada. Pase menos tiempo sentado. Incluso la actividad fsica ligera puede ser beneficiosa. Controle sus niveles de colesterol y lpidos en la sangre Comience a realizarse anlisis de lpidos y Oncologist en la sangre a los 20 aos y luego reptalos cada 5 aos. Es posible que Insurance underwriter los niveles de colesterol con mayor frecuencia si: Sus niveles de lpidos y colesterol son altos. Es mayor de 40 aos. Presenta un alto riesgo de padecer enfermedades cardacas. Qu debo  saber sobre las pruebas de deteccin del cncer? Muchos tipos de cncer pueden detectarse de manera temprana y, a menudo, pueden prevenirse. Segn su historia clnica y sus antecedentes familiares, es posible que deba realizarse pruebas de deteccin del cncer en diferentes edades. Esto puede incluir pruebas de deteccin de lo siguiente: Building services engineer. Cncer de prstata. Cncer de piel. Cncer de pulmn. Qu debo saber sobre la enfermedad cardaca, la diabetes y la hipertensin arterial? Presin arterial y enfermedad cardaca La hipertensin arterial causa enfermedades cardacas y [place] el riesgo de accidente cerebrovascular. Es ms probable que esto se manifieste en las personas que tienen lecturas de presin arterial alta o tienen sobrepeso. Hable con el mdico sobre sus valores de presin arterial deseados. Hgase controlar la presin arterial: Cada 3 a 5 aos si tiene entre 18 y 71 aos. Todos los aos si es mayor de 40 aos. Si tiene entre 65 y 26 aos y es fumador o Insurance underwriter, pregntele al mdico si debe realizarse una prueba de deteccin de aneurisma artico abdominal (AAA) por nica vez. Diabetes Realcese exmenes de deteccin de la diabetes con regularidad. Este anlisis revisa el nivel de azcar en la sangre en [place]. Hgase las pruebas de deteccin: Cada tres aos despus de los 45 aos de edad si tiene un peso normal y un bajo riesgo de padecer diabetes. Con ms frecuencia y a partir de [place] edad inferior si tiene sobrepeso o un alto riesgo de padecer diabetes. Qu debo saber sobre la prevencin de infecciones? Hepatitis B Si tiene un riesgo ms alto de contraer hepatitis B, debe someterse a un examen de deteccin de este virus. Hable con el mdico para averiguar si tiene riesgo de  contraer la infeccin por hepatitis B. Hepatitis C Se recomienda un anlisis de Benjamin Perez para: Todos los que nacieron entre 1946 y [phone]. Todas las personas que tengan un riesgo de haber  contrado hepatitis C. Enfermedades de transmisin sexual (ETS) Debe realizarse pruebas de deteccin de ITS todos los aos, incluidas la gonorrea y la clamidia, si: Es sexualmente activo y es menor de [address]. Es mayor de [address], y Public affairs consultant informa que corre riesgo de tener este tipo de infecciones. La actividad sexual ha cambiado desde que le hicieron la ltima prueba de deteccin y tiene un riesgo mayor de Warehouse manager clamidia o Copy. Pregntele al mdico si usted tiene riesgo. Pregntele al mdico si usted tiene un alto riesgo de Primary school teacher VIH. El mdico tambin puede recomendarle un medicamento recetado para ayudar a evitar la infeccin por el VIH. Si elige tomar medicamentos para prevenir el VIH, primero debe [employer] de deteccin del VIH. Luego debe hacerse anlisis cada 3 meses mientras est tomando los medicamentos. Siga estas indicaciones en su casa: Consumo de alcohol No beba alcohol si el mdico se lo prohbe. Si bebe alcohol: Limite la cantidad que consume de 0 a 2 bebidas por da. Sepa cunta cantidad de alcohol hay en las bebidas que toma. En los [address], una medida equivale a una botella de cerveza de 12 oz (355 ml), un vaso de vino de 5 oz (148 ml) o un vaso de una bebida alcohlica de alta graduacin de 1 oz (44 ml). Estilo de vida No consuma ningn producto que contenga nicotina o tabaco. Estos productos incluyen cigarrillos, tabaco para Theatre manager y aparatos de vapeo, como los Administrator, Civil Service. Si necesita ayuda para dejar de consumir estos productos, consulte al mdico. No consuma drogas. No comparta agujas. Solicite ayuda a su mdico si necesita apoyo o informacin para abandonar las drogas. Indicaciones generales Realcese los estudios de rutina de [address], dentales y de Wellsite geologist. Mantngase al da con las vacunas. Infrmele a su mdico si: Se siente deprimido con frecuencia. Alguna vez ha sido vctima de [place] o no se siente seguro en su  casa. Resumen Adoptar un estilo de vida saludable y recibir atencin preventiva son importantes para promover la salud y Counsellor. Siga las instrucciones del mdico acerca de una dieta saludable, el ejercicio y la realizacin de pruebas o exmenes para Hotel manager. Siga las instrucciones del mdico con respecto al control del colesterol y la presin arterial. Esta informacin no tiene Theme park manager el consejo del mdico. Asegrese de hacerle al mdico cualquier pregunta que tenga. Document Revised: 01/28/2021 Document Reviewed: 01/28/2021 Elsevier Patient Education  2024 [employer].

## 2024-04-09 NOTE — Assessment & Plan Note (Signed)
No clinical bleeding episodes in the past 6 months. Stable.  No concerns today. CBC done. Continue carvedilol 3.125 mg twice a day

## 2024-04-09 NOTE — Assessment & Plan Note (Signed)
 Stable.  No concerns. No significant ascites. No signs of spontaneous bacterial peritonitis

## 2024-04-09 NOTE — Assessment & Plan Note (Addendum)
 Hemoglobin A1c today at 7.8, higher than before. Has not been taking glipizide  because he ran out of it. Continue metformin  500 mg twice a day and glipizide  5 mg twice a day Cardiovascular risks associated with diabetes discussed Diet and nutrition discussed Follow-up in 3 months

## 2024-04-09 NOTE — Assessment & Plan Note (Signed)
Clinically stable.  CBC done today.  No concerns.

## 2024-04-09 NOTE — Progress Notes (Addendum)
 Wesley Williams 53 y.o.   Chief Complaint  Patient presents with   Medical Management of Chronic Issues    3 month follow up. No Concerns .     HISTORY OF PRESENT ILLNESS: This is a 53 y.o. male here for 69-month follow-up of multiple chronic medical conditions. Overall doing well.  Has no complaints or medical concerns. Needs medication refills.  HPI   Prior to Admission medications   Medication Sig Start Date End Date Taking? Authorizing Provider  acetaminophen  (TYLENOL ) 500 MG tablet Take 500 mg by mouth every 6 (six) hours as needed (pain).   Yes [provider]  carvedilol  (COREG ) 3.125 MG tablet Take 1 tablet (3.125 mg total) by mouth 2 (two) times daily. 03/21/23 04/09/24 Yes Ashar Lewinski Benigno, MD  diclofenac  Sodium (VOLTAREN ) 1 % GEL Apply 2 g topically 4 (four) times daily. 09/28/22  Yes Aberman, Aleck BROCKS, PA-C  ferrous sulfate  325 (65 FE) MG tablet Take 325 mg by mouth daily with breakfast.   Yes [provider]  furosemide  (LASIX ) 20 MG tablet Take 1 tablet (20 mg total) by mouth daily. 03/21/23  Yes Logon Uttech, Emil Schanz, MD  metFORMIN  (GLUCOPHAGE ) 500 MG tablet Take 1 tablet (500 mg total) by mouth 2 (two) times daily with a meal. 12/28/23 04/09/24 Yes Dolce Sylvia, Emil Schanz, MD  spironolactone  (ALDACTONE ) 50 MG tablet Take 1 tablet (50 mg total) by mouth daily. 03/21/23  Yes Nissim Fleischer, Emil Schanz, MD  glipiZIDE  (GLUCOTROL ) 5 MG tablet Take 1 tablet (5 mg total) by mouth 2 (two) times daily before a meal. 04/09/24   Tyonna Talerico, Emil Schanz, MD  sildenafil  (VIAGRA ) 100 MG tablet Take 1 tablet (100 mg total) by mouth as needed for erectile dysfunction. 04/09/24   Purcell Emil Schanz, MD    No Known Allergies  Patient Active Problem List   Diagnosis Date Noted   Thrombocytopenia (HCC) 09/08/2022   Type 2 diabetes mellitus with hyperglycemia, without long-term current use of insulin  (HCC) 12/28/2021   Secondary esophageal varices without bleeding (HCC)    Rectal  varices    Portal hypertension (HCC)-colopathy    Alcoholic cirrhosis of liver without ascites (HCC) 03/18/2020   Pancytopenia (HCC)    Anemia 12/12/2017   Erectile dysfunction 12/12/2017    Past Medical History:  Diagnosis Date   Alcohol abuse    Cirrhosis (HCC)    Hypertension    Portal hypertension (HCC)-colopathy    Rhabdomyolysis 04/23/2016   Secondary esophageal varices without bleeding (HCC)    Type 2 diabetes mellitus (HCC)     Past Surgical History:  Procedure Laterality Date   COLONOSCOPY WITH PROPOFOL  N/A 03/19/2020   Procedure: COLONOSCOPY WITH PROPOFOL ;  Surgeon: Avram Lupita BRAVO, MD;  Location: [facility] ENDOSCOPY;  Service: Endoscopy;  Laterality: N/A;   ESOPHAGEAL BANDING  03/19/2020   Procedure: ESOPHAGEAL BANDING;  Surgeon: Avram Lupita BRAVO, MD;  Location: [facility] ENDOSCOPY;  Service: Endoscopy;;   ESOPHAGEAL BANDING N/A 04/07/2020   Procedure: ESOPHAGEAL BANDING;  Surgeon: Avram Lupita BRAVO, MD;  Location: [facility] ENDOSCOPY;  Service: Endoscopy;  Laterality: N/A;   ESOPHAGEAL BANDING N/A 05/19/2020   Procedure: ESOPHAGEAL BANDING;  Surgeon: Charlanne Groom, MD;  Location: [facility] ENDOSCOPY;  Service: Endoscopy;  Laterality: N/A;   ESOPHAGEAL BANDING  05/09/2022   Procedure: ESOPHAGEAL BANDING;  Surgeon: Stacia Glendia BRAVO, MD;  Location: [facility] ENDOSCOPY;  Service: Gastroenterology;;   ESOPHAGEAL BANDING  06/14/2022   Procedure: ESOPHAGEAL BANDING;  Surgeon: Avram Lupita BRAVO, MD;  Location: [facility] ENDOSCOPY;  Service: Gastroenterology;;  ESOPHAGEAL BANDING  08/10/2022   Procedure: ESOPHAGEAL BANDING;  Surgeon: Avram Lupita BRAVO, MD;  Location: [facility] ENDOSCOPY;  Service: Gastroenterology;;   ESOPHAGOGASTRODUODENOSCOPY (EGD) WITH PROPOFOL  N/A 03/19/2020   Procedure: ESOPHAGOGASTRODUODENOSCOPY (EGD) WITH PROPOFOL ;  Surgeon: Avram Lupita BRAVO, MD;  Location[facility]COPY;  Service: Endoscopy;  Laterality: N/A;   ESOPHAGOGASTRODUODENOSCOPY (EGD) WITH PROPOFOL  N/A 04/07/2020   Procedure: ESOPHAGOGASTRODUODENOSCOPY  (EGD) WITH PROPOFOL ;  Surgeon: Avram Lupita BRAVO, MD;  Location: [facility] ENDOSCOPY;  Service: Endoscopy;  Laterality: N/A;   ESOPHAGOGASTRODUODENOSCOPY (EGD) WITH PROPOFOL  N/A 05/19/2020   Procedure: ESOPHAGOGASTRODUODENOSCOPY (EGD) WITH PROPOFOL ;  Surgeon: Charlanne Groom, MD;  Location: [facility] ENDOSCOPY;  Service: Endoscopy;  Laterality: N/A;   ESOPHAGOGASTRODUODENOSCOPY (EGD) WITH PROPOFOL  N/A 05/09/2022   Procedure: ESOPHAGOGASTRODUODENOSCOPY (EGD) WITH PROPOFOL ;  Surgeon: Stacia Glendia BRAVO, MD;  Location: [facility] ENDOSCOPY;  Service: Gastroenterology;  Laterality: N/A;   ESOPHAGOGASTRODUODENOSCOPY (EGD) WITH PROPOFOL  N/A 06/14/2022   Procedure: ESOPHAGOGASTRODUODENOSCOPY (EGD) WITH PROPOFOL ;  Surgeon: Avram Lupita BRAVO, MD;  Location: [facility] ENDOSCOPY;  Service: Gastroenterology;  Laterality: N/A;   ESOPHAGOGASTRODUODENOSCOPY (EGD) WITH PROPOFOL  N/A 08/10/2022   Procedure: ESOPHAGOGASTRODUODENOSCOPY (EGD) WITH PROPOFOL ;  Surgeon: Avram Lupita BRAVO, MD;  Location: [facility] ENDOSCOPY;  Service: Gastroenterology;  Laterality: N/A;   ESOPHAGOGASTRODUODENOSCOPY (EGD) WITH PROPOFOL  N/A 09/28/2022   Procedure: ESOPHAGOGASTRODUODENOSCOPY (EGD) WITH PROPOFOL ;  Surgeon: Avram Lupita BRAVO, MD;  Location: [facility] ENDOSCOPY;  Service: Gastroenterology;  Laterality: N/A;    Social History   Socioeconomic History   Marital status: Married    Spouse name: Not on file   Number of children: Not on file   Years of education: Not on file   Highest education level: Not on file  Occupational History   Not on file  Tobacco Use   Smoking status: Never   Smokeless tobacco: Never  Vaping Use   Vaping status: Never Used  Substance and Sexual Activity   Alcohol use: Not Currently    Comment: former etoh abuse, none since july 2021.   Drug use: No   Sexual activity: Yes  Other Topics Concern   Not on file  Social History Narrative   Single lives w/ long-term girlfriend   Works for a tree service   Alcohol abuse hx   Never smoker, no drugs    Social Drivers of Corporate investment banker Strain: Not on file  Food Insecurity: No Food Insecurity (05/12/2022)   Hunger Vital Sign    Worried About Running Out of Food in the Last Year: Never true    Ran Out of Food in the Last Year: Never true  Transportation Needs: No Transportation Needs (05/12/2022)   PRAPARE - Administrator, Civil Service (Medical): No    Lack of Transportation (Non-Medical): No  Physical Activity: Not on file  Stress: Not on file  Social Connections: Not on file  Intimate Partner Violence: Not At Risk (05/12/2022)   Humiliation, Afraid, Rape, and Kick questionnaire    Fear of Current or Ex-Partner: No    Emotionally Abused: No    Physically Abused: No    Sexually Abused: No    Family History  Problem Relation Age of Onset   Diabetes Mother      Review of Systems  Constitutional: Negative.  Negative for chills and fever.  HENT: Negative.  Negative for congestion and sore throat.   Respiratory: Negative.  Negative for cough and shortness of breath.   Cardiovascular: Negative.  Negative for chest pain and palpitations.  Gastrointestinal:  Negative for abdominal pain,  diarrhea, nausea and vomiting.  Genitourinary: Negative.  Negative for dysuria and hematuria.  Skin: Negative.  Negative for rash.  Neurological: Negative.  Negative for dizziness and headaches.  All other systems reviewed and are negative.   Vitals:   04/09/24 0804  BP: 138/76  Pulse: 69  Temp: 97.8 F (36.6 C)  SpO2: 99%    Physical Exam Vitals reviewed.  Constitutional:      Appearance: Normal appearance.  HENT:     Head: Normocephalic.     Mouth/Throat:     Mouth: Mucous membranes are moist.     Pharynx: Oropharynx is clear.  Eyes:     Extraocular Movements: Extraocular movements intact.     Conjunctiva/sclera: Conjunctivae normal.     Pupils: Pupils are equal, round, and reactive to light.  Cardiovascular:     Rate and Rhythm: Normal rate and regular  rhythm.     Pulses: Normal pulses.     Heart sounds: Normal heart sounds.  Pulmonary:     Effort: Pulmonary effort is normal.     Breath sounds: Normal breath sounds.  Abdominal:     General: There is no distension.     Palpations: Abdomen is soft.     Tenderness: There is no abdominal tenderness.  Musculoskeletal:     Cervical back: No tenderness.  Lymphadenopathy:     Cervical: No cervical adenopathy.  Skin:    General: Skin is warm and dry.     Capillary Refill: Capillary refill takes less than 2 seconds.  Neurological:     General: No focal deficit present.     Mental Status: He is alert and oriented to person, place, and time.  Psychiatric:        Mood and Affect: Mood normal.        Behavior: Behavior normal.    Lab Results  Component Value Date   HGBA1C 7.8 (H) 04/09/2024      ASSESSMENT & PLAN: A total of 45 minutes was spent with the patient and counseling/coordination of care regarding preparing for this visit, review of most recent office visit notes, review of multiple chronic medical conditions and their management, review of all medications, review of most recent bloodwork results, review of health maintenance items, education on nutrition, prognosis, documentation, and need for follow up.   Problem List Items Addressed This Visit       Cardiovascular and Mediastinum   Secondary esophageal varices without bleeding (HCC)   No clinical bleeding episodes in the past 6 months. Stable.  No concerns today. CBC done. Continue carvedilol  3.125 mg twice a day      Relevant Medications   sildenafil  (VIAGRA ) 100 MG tablet   Portal hypertension (HCC)-colopathy   Stable.  No concerns. No significant ascites. No signs of spontaneous bacterial peritonitis      Relevant Medications   sildenafil  (VIAGRA ) 100 MG tablet   Other Relevant Orders   Comprehensive metabolic panel with GFR (Completed)   CBC with Differential/Platelet (Completed)     Digestive    Alcoholic cirrhosis of liver without ascites (HCC) - Primary   Well compensated liver cirrhosis No longer drinking. No complications.  No signs of hepatic encephalopathy. Clinically euvolemic.  No significant ascites.  No signs of peritonitis No peripheral edema. Continue Aldactone  50 mg daily, furosemide  20 mg daily and carvedilol  3.125 mg twice a day        Relevant Orders   Comprehensive metabolic panel with GFR (Completed)   CBC with Differential/Platelet (Completed)  Lipid panel (Completed)     Endocrine   Type 2 diabetes mellitus with hyperglycemia, without long-term current use of insulin  (HCC)   Hemoglobin A1c today at 7.8, higher than before. Has not been taking glipizide  because he ran out of it. Continue metformin  500 mg twice a day and glipizide  5 mg twice a day Cardiovascular risks associated with diabetes discussed Diet and nutrition discussed Follow-up in 3 months      Relevant Medications   glipiZIDE  (GLUCOTROL ) 5 MG tablet   Other Relevant Orders   Hemoglobin A1c (Completed)     Hematopoietic and Hemostatic   Pancytopenia (HCC)   Clinically stable.  CBC done today.  No concerns.      Relevant Orders   CBC with Differential/Platelet (Completed)   Thrombocytopenia (HCC)   No clinical bleeding episodes.  No petechia or ecchymosis CBC done today      Relevant Orders   CBC with Differential/Platelet (Completed)     Other   Erectile dysfunction   Relevant Medications   sildenafil  (VIAGRA ) 100 MG tablet     Patient Instructions  Mantenimiento de Research officer, political party, Male Adoptar un estilo de vida saludable y recibir atencin preventiva son importantes para promover la salud y Counsellor. Consulte al mdico sobre: El esquema adecuado para hacerse pruebas y exmenes peridicos. Cosas que puede hacer por su cuenta para prevenir enfermedades y [place] sano. Qu debo saber sobre la dieta, el peso y el ejercicio? Consuma una  dieta saludable  Consuma una dieta que incluya muchas verduras, frutas, productos lcteos con bajo contenido de grasa y protenas magras. No consuma muchos alimentos ricos en grasas slidas, azcares agregados o sodio. Mantenga un peso saludable El ndice de masa muscular [facility]) es una medida que puede utilizarse para identificar posibles problemas de [place]. Proporciona una estimacin de la grasa corporal basndose en el peso y la altura. Su mdico puede ayudarle a determinar su IMC y a Personnel officer o Pharmacologist un peso saludable. Haga ejercicio con regularidad Haga ejercicio con regularidad. Esta es una de las prcticas ms importantes que puede hacer por su salud. La [employer] de los adultos deben seguir estas pautas: Education officer, environmental, al menos, 150 minutos de actividad fsica por semana. El ejercicio debe aumentar la frecuencia cardaca y Media planner transpirar (ejercicio de intensidad moderada). Hacer ejercicios de fortalecimiento por lo [employer] por semana. Agregue esto a su plan de ejercicio de intensidad moderada. Pase menos tiempo sentado. Incluso la actividad fsica ligera puede ser beneficiosa. Controle sus niveles de colesterol y lpidos en la sangre Comience a realizarse anlisis de lpidos y Oncologist en la sangre a los 20 aos y luego reptalos cada 5 aos. Es posible que Insurance underwriter los niveles de colesterol con mayor frecuencia si: Sus niveles de lpidos y colesterol son altos. Es mayor de 40 aos. Presenta un alto riesgo de padecer enfermedades cardacas. Qu debo saber sobre las pruebas de deteccin del cncer? Muchos tipos de cncer pueden detectarse de manera temprana y, a menudo, pueden prevenirse. Segn su historia clnica y sus antecedentes familiares, es posible que deba realizarse pruebas de deteccin del cncer en diferentes edades. Esto puede incluir pruebas de deteccin de lo siguiente: Building services engineer. Cncer de prstata. Cncer de piel. Cncer de pulmn. Qu debo  saber sobre la enfermedad cardaca, la diabetes y la hipertensin arterial? Presin arterial y enfermedad cardaca La hipertensin arterial causa enfermedades cardacas y lesotho el riesgo de accidente cerebrovascular. Es ms probable que esto se manifieste  en las personas que tienen lecturas de presin arterial alta o tienen sobrepeso. Hable con el mdico sobre sus valores de presin arterial deseados. Hgase controlar la presin arterial: Cada 3 a 5 aos si tiene entre 18 y 63 aos. Todos los aos si es mayor de 40 aos. Si tiene entre 65 y 54 aos y es fumador o Insurance underwriter, pregntele al mdico si debe realizarse una prueba de deteccin de aneurisma artico abdominal (AAA) por nica vez. Diabetes Realcese exmenes de deteccin de la diabetes con regularidad. Este anlisis revisa el nivel de azcar en la sangre en [place]. Hgase las pruebas de deteccin: Cada tres aos despus de los 45 aos de edad si tiene un peso normal y un bajo riesgo de padecer diabetes. Con ms frecuencia y a partir de [place] edad inferior si tiene sobrepeso o un alto riesgo de padecer diabetes. Qu debo saber sobre la prevencin de infecciones? Hepatitis B Si tiene un riesgo ms alto de contraer hepatitis B, debe someterse a un examen de deteccin de este virus. Hable con el mdico para averiguar si tiene riesgo de contraer la infeccin por hepatitis B. Hepatitis C Se recomienda un anlisis de [place] para: Todos los que nacieron entre 1946 y [phone]. Todas las personas que tengan un riesgo de haber contrado hepatitis C. Enfermedades de transmisin sexual (ETS) Debe realizarse pruebas de deteccin de ITS todos los aos, incluidas la gonorrea y la clamidia, si: Es sexualmente activo y es menor de [address]. Es mayor de [address], y Public affairs consultant informa que corre riesgo de tener este tipo de infecciones. La actividad sexual ha cambiado desde que le hicieron la ltima prueba de deteccin y tiene un riesgo mayor de tener  clamidia o Copy. Pregntele al mdico si usted tiene riesgo. Pregntele al mdico si usted tiene un alto riesgo de Primary school teacher VIH. El mdico tambin puede recomendarle un medicamento recetado para ayudar a evitar la infeccin por el VIH. Si elige tomar medicamentos para prevenir el VIH, primero debe [employer] de deteccin del VIH. Luego debe hacerse anlisis cada 3 meses mientras est tomando los medicamentos. Siga estas indicaciones en su casa: Consumo de alcohol No beba alcohol si el mdico se lo prohbe. Si bebe alcohol: Limite la cantidad que consume de 0 a 2 bebidas por da. Sepa cunta cantidad de alcohol hay en las bebidas que toma. En los [address], una medida equivale a una botella de cerveza de 12 oz (355 ml), un vaso de vino de 5 oz (148 ml) o un vaso de una bebida alcohlica de alta graduacin de 1 oz (44 ml). Estilo de vida No consuma ningn producto que contenga nicotina o tabaco. Estos productos incluyen cigarrillos, tabaco para Theatre manager y aparatos de vapeo, como los cigarrillos electrnicos. Si necesita ayuda para dejar de consumir estos productos, consulte al mdico. No consuma drogas. No comparta agujas. Solicite ayuda a su mdico si necesita apoyo o informacin para abandonar las drogas. Indicaciones generales Realcese los estudios de rutina de [address], dentales y de Wellsite geologist. Mantngase al da con las vacunas. Infrmele a su mdico si: Se siente deprimido con frecuencia. Alguna vez ha sido vctima de maltrato o no se siente seguro en su casa. Resumen Adoptar un estilo de vida saludable y recibir atencin preventiva son importantes para promover la salud y Counsellor. Siga las instrucciones del mdico acerca de una dieta saludable, el ejercicio y la realizacin de pruebas o exmenes para Hotel manager. Siga las instrucciones  del mdico con respecto al control del colesterol y la presin arterial. Esta informacin no tiene Theme park manager el  consejo del mdico. Asegrese de hacerle al mdico cualquier pregunta que tenga. Document Revised: 01/28/2021 Document Reviewed: 01/28/2021 Elsevier Patient Education  2024 Elsevier Inc.    Emil Schaumann, MD [facility]

## 2024-05-22 ENCOUNTER — Other Ambulatory Visit: Payer: Self-pay | Admitting: Emergency Medicine

## 2024-05-22 DIAGNOSIS — E1165 Type 2 diabetes mellitus with hyperglycemia: Secondary | ICD-10-CM

## 2024-05-22 NOTE — Telephone Encounter (Unsigned)
 Copied from CRM [id number]. Topic: Clinical - Medication Refill >> May 22, 2024  9:08 AM Burnard DEL wrote: Medication: metFORMIN  (GLUCOPHAGE ) 500 MG tablet (Expired)  Has the patient contacted their pharmacy? No (Agent: If no, request that the patient contact the pharmacy for the refill. If patient does not wish to contact the pharmacy document the reason why and proceed with request.) (Agent: If yes, when and what did the pharmacy advise?)  This is the patient's preferred pharmacy:  [id number] - [place] - [address] [place] ([place]) [place] [address] Phone: [phone] Fax: [phone]  Is this the correct pharmacy for this prescription? Yes If no, delete pharmacy and type the correct one.   Has the prescription been filled recently? No  Is the patient out of the medication? Yes  Has the patient been seen for an appointment in the last year OR does the patient have an upcoming appointment? Yes  Can we respond through MyChart? No  Agent: Please be advised that Rx refills may take up to 3 business days. We ask that you follow-up with your pharmacy.

## 2024-05-23 MED ORDER — METFORMIN HCL 500 MG PO TABS: 500.0000 mg | ORAL_TABLET | Freq: Two times a day (BID) | ORAL | 3 refills | Status: AC

## 2024-06-07 NOTE — Telephone Encounter (Signed)
 Error

## 2024-07-25 ENCOUNTER — Other Ambulatory Visit: Payer: Self-pay | Admitting: Emergency Medicine

## 2024-07-25 DIAGNOSIS — E1165 Type 2 diabetes mellitus with hyperglycemia: Secondary | ICD-10-CM

## 2024-07-25 DIAGNOSIS — K7030 Alcoholic cirrhosis of liver without ascites: Secondary | ICD-10-CM

## 2024-07-25 NOTE — Telephone Encounter (Unsigned)
 Copied from CRM #[id number]. Topic: Clinical - Medication Refill >> Jul 25, 2024  3:43 PM Brittany M wrote: Medication: glipiZIDE  (GLUCOTROL ) 5 MG tablet ;  carvedilol  (COREG ) 3.125 MG tablet    Has the patient contacted their pharmacy? Yes (Agent: If no, request that the patient contact the pharmacy for the refill. If patient does not wish to contact the pharmacy document the reason why and proceed with request.) (Agent: If yes, when and what did the pharmacy advise?)  This is the patient's preferred pharmacy:  [facility] - [place] ([place]), [place] - [address] [place] ([place])  [address] Phone: [phone] Fax: [phone]  Is this the correct pharmacy for this prescription? Yes If no, delete pharmacy and type the correct one.   Has the prescription been filled recently? Yes  Is the patient out of the medication? Yes  Has the patient been seen for an appointment in the last year OR does the patient have an upcoming appointment? Yes  Can we respond through MyChart? Yes  Agent: Please be advised that Rx refills may take up to 3 business days. We ask that you follow-up with your pharmacy.

## 2024-07-26 MED ORDER — CARVEDILOL 3.125 MG PO TABS: 3.1250 mg | ORAL_TABLET | Freq: Two times a day (BID) | ORAL | 3 refills | Status: AC

## 2024-07-26 MED ORDER — GLIPIZIDE 5 MG PO TABS
5.0000 mg | ORAL_TABLET | Freq: Two times a day (BID) | ORAL | 3 refills | Status: AC
Start: 2024-07-26 — End: ?

## 2024-09-18 ENCOUNTER — Ambulatory Visit: Payer: Self-pay | Admitting: Emergency Medicine

## 2024-09-18 DIAGNOSIS — E1165 Type 2 diabetes mellitus with hyperglycemia: Secondary | ICD-10-CM
